# Patient Record
Sex: Female | Born: 1960
Health system: Southern US, Community
[De-identification: ages and names within clinical notes are randomized; demographics above are authoritative.]

## PROBLEM LIST (undated history)

## (undated) DIAGNOSIS — R609 Edema, unspecified: Secondary | ICD-10-CM

## (undated) DIAGNOSIS — G4730 Sleep apnea, unspecified: Secondary | ICD-10-CM

## (undated) DIAGNOSIS — R51 Headache: Secondary | ICD-10-CM

## (undated) DIAGNOSIS — M549 Dorsalgia, unspecified: Secondary | ICD-10-CM

## (undated) DIAGNOSIS — M2550 Pain in unspecified joint: Secondary | ICD-10-CM

## (undated) DIAGNOSIS — M62838 Other muscle spasm: Secondary | ICD-10-CM

## (undated) DIAGNOSIS — G8929 Other chronic pain: Secondary | ICD-10-CM

## (undated) DIAGNOSIS — R3915 Urgency of urination: Secondary | ICD-10-CM

## (undated) DIAGNOSIS — M797 Fibromyalgia: Secondary | ICD-10-CM

## (undated) DIAGNOSIS — Z87898 Personal history of other specified conditions: Secondary | ICD-10-CM

## (undated) DIAGNOSIS — T7840XA Allergy, unspecified, initial encounter: Secondary | ICD-10-CM

## (undated) DIAGNOSIS — F32A Depression, unspecified: Secondary | ICD-10-CM

## (undated) DIAGNOSIS — T8859XA Other complications of anesthesia, initial encounter: Secondary | ICD-10-CM

## (undated) DIAGNOSIS — Z8709 Personal history of other diseases of the respiratory system: Secondary | ICD-10-CM

## (undated) DIAGNOSIS — G4700 Insomnia, unspecified: Secondary | ICD-10-CM

## (undated) DIAGNOSIS — Z860100 Personal history of colon polyps, unspecified: Secondary | ICD-10-CM

## (undated) DIAGNOSIS — M1990 Unspecified osteoarthritis, unspecified site: Secondary | ICD-10-CM

## (undated) DIAGNOSIS — D649 Anemia, unspecified: Secondary | ICD-10-CM

## (undated) DIAGNOSIS — R350 Frequency of micturition: Secondary | ICD-10-CM

## (undated) DIAGNOSIS — K589 Irritable bowel syndrome without diarrhea: Secondary | ICD-10-CM

## (undated) DIAGNOSIS — R52 Pain, unspecified: Secondary | ICD-10-CM

## (undated) DIAGNOSIS — R32 Unspecified urinary incontinence: Secondary | ICD-10-CM

## (undated) DIAGNOSIS — F419 Anxiety disorder, unspecified: Secondary | ICD-10-CM

## (undated) DIAGNOSIS — I10 Essential (primary) hypertension: Secondary | ICD-10-CM

## (undated) DIAGNOSIS — R600 Localized edema: Secondary | ICD-10-CM

## (undated) DIAGNOSIS — K219 Gastro-esophageal reflux disease without esophagitis: Secondary | ICD-10-CM

## (undated) DIAGNOSIS — T4145XA Adverse effect of unspecified anesthetic, initial encounter: Secondary | ICD-10-CM

## (undated) DIAGNOSIS — G709 Myoneural disorder, unspecified: Secondary | ICD-10-CM

## (undated) DIAGNOSIS — R351 Nocturia: Secondary | ICD-10-CM

## (undated) DIAGNOSIS — G629 Polyneuropathy, unspecified: Secondary | ICD-10-CM

## (undated) DIAGNOSIS — M810 Age-related osteoporosis without current pathological fracture: Secondary | ICD-10-CM

## (undated) DIAGNOSIS — F329 Major depressive disorder, single episode, unspecified: Secondary | ICD-10-CM

## (undated) DIAGNOSIS — I499 Cardiac arrhythmia, unspecified: Secondary | ICD-10-CM

## (undated) HISTORY — PX: ANTERIOR FUSION CERVICAL SPINE: SUR626

## (undated) HISTORY — PX: CHOLECYSTECTOMY: SHX55

## (undated) HISTORY — PX: COLONOSCOPY: SHX174

## (undated) HISTORY — PX: KNEE ARTHROSCOPY: SUR90

## (undated) HISTORY — PX: CARPAL TUNNEL RELEASE: SHX101

## (undated) HISTORY — PX: OTHER SURGICAL HISTORY: SHX169

## (undated) HISTORY — PX: LUMBAR LAMINECTOMY: SHX95

## (undated) HISTORY — PX: ABDOMINAL HYSTERECTOMY: SHX81

## (undated) HISTORY — PX: SEPTOPLASTY: SUR1290

## (undated) HISTORY — PX: ESOPHAGEAL DILATION: SHX303

---

## 1997-06-28 ENCOUNTER — Emergency Department (HOSPITAL_COMMUNITY): Admission: EM | Admit: 1997-06-28 | Discharge: 1997-06-28 | Payer: Self-pay | Admitting: Emergency Medicine

## 1998-03-11 ENCOUNTER — Encounter: Payer: Self-pay | Admitting: Surgery

## 1998-03-17 ENCOUNTER — Encounter: Payer: Self-pay | Admitting: Surgery

## 1998-03-17 ENCOUNTER — Observation Stay (HOSPITAL_COMMUNITY): Admission: RE | Admit: 1998-03-17 | Discharge: 1998-03-18 | Payer: Self-pay | Admitting: Surgery

## 1999-01-08 ENCOUNTER — Other Ambulatory Visit: Admission: RE | Admit: 1999-01-08 | Discharge: 1999-01-08 | Payer: Self-pay | Admitting: Family Medicine

## 2000-05-03 ENCOUNTER — Encounter: Payer: Self-pay | Admitting: Neurosurgery

## 2000-05-05 ENCOUNTER — Encounter: Payer: Self-pay | Admitting: Neurosurgery

## 2000-05-05 ENCOUNTER — Inpatient Hospital Stay (HOSPITAL_COMMUNITY): Admission: RE | Admit: 2000-05-05 | Discharge: 2000-05-06 | Payer: Self-pay | Admitting: Neurosurgery

## 2000-05-15 ENCOUNTER — Emergency Department (HOSPITAL_COMMUNITY): Admission: EM | Admit: 2000-05-15 | Discharge: 2000-05-15 | Payer: Self-pay | Admitting: Emergency Medicine

## 2000-05-24 ENCOUNTER — Encounter: Admission: RE | Admit: 2000-05-24 | Discharge: 2000-05-24 | Payer: Self-pay | Admitting: Neurosurgery

## 2000-05-24 ENCOUNTER — Encounter: Payer: Self-pay | Admitting: Neurosurgery

## 2000-07-05 ENCOUNTER — Other Ambulatory Visit: Admission: RE | Admit: 2000-07-05 | Discharge: 2000-07-05 | Payer: Self-pay | Admitting: Family Medicine

## 2000-07-10 ENCOUNTER — Encounter: Payer: Self-pay | Admitting: Neurosurgery

## 2000-07-10 ENCOUNTER — Encounter: Admission: RE | Admit: 2000-07-10 | Discharge: 2000-07-10 | Payer: Self-pay | Admitting: Neurosurgery

## 2001-01-05 ENCOUNTER — Encounter: Admission: RE | Admit: 2001-01-05 | Discharge: 2001-01-05 | Payer: Self-pay | Admitting: Otolaryngology

## 2001-01-05 ENCOUNTER — Encounter: Payer: Self-pay | Admitting: Otolaryngology

## 2001-01-16 ENCOUNTER — Encounter (INDEPENDENT_AMBULATORY_CARE_PROVIDER_SITE_OTHER): Payer: Self-pay | Admitting: Specialist

## 2001-01-16 ENCOUNTER — Other Ambulatory Visit: Admission: RE | Admit: 2001-01-16 | Discharge: 2001-01-16 | Payer: Self-pay | Admitting: Internal Medicine

## 2001-11-22 ENCOUNTER — Other Ambulatory Visit: Admission: RE | Admit: 2001-11-22 | Discharge: 2001-11-22 | Payer: Self-pay | Admitting: Family Medicine

## 2002-11-21 ENCOUNTER — Encounter: Payer: Self-pay | Admitting: Family Medicine

## 2002-11-21 ENCOUNTER — Ambulatory Visit (HOSPITAL_COMMUNITY): Admission: RE | Admit: 2002-11-21 | Discharge: 2002-11-21 | Payer: Self-pay | Admitting: Family Medicine

## 2002-12-10 ENCOUNTER — Other Ambulatory Visit: Admission: RE | Admit: 2002-12-10 | Discharge: 2002-12-10 | Payer: Self-pay | Admitting: Gynecology

## 2003-07-20 ENCOUNTER — Emergency Department (HOSPITAL_COMMUNITY): Admission: EM | Admit: 2003-07-20 | Discharge: 2003-07-20 | Payer: Self-pay

## 2003-08-22 ENCOUNTER — Ambulatory Visit (HOSPITAL_COMMUNITY): Admission: RE | Admit: 2003-08-22 | Discharge: 2003-08-23 | Payer: Self-pay | Admitting: Neurosurgery

## 2004-09-23 ENCOUNTER — Encounter: Admission: RE | Admit: 2004-09-23 | Discharge: 2004-11-04 | Payer: Self-pay | Admitting: Orthopedic Surgery

## 2005-02-11 ENCOUNTER — Other Ambulatory Visit: Admission: RE | Admit: 2005-02-11 | Discharge: 2005-02-11 | Payer: Self-pay | Admitting: Family Medicine

## 2005-04-22 ENCOUNTER — Ambulatory Visit (HOSPITAL_COMMUNITY): Admission: RE | Admit: 2005-04-22 | Discharge: 2005-04-22 | Payer: Self-pay | Admitting: Neurosurgery

## 2005-04-25 ENCOUNTER — Ambulatory Visit (HOSPITAL_COMMUNITY): Admission: RE | Admit: 2005-04-25 | Discharge: 2005-04-25 | Payer: Self-pay | Admitting: Family Medicine

## 2005-07-01 ENCOUNTER — Observation Stay (HOSPITAL_COMMUNITY): Admission: RE | Admit: 2005-07-01 | Discharge: 2005-07-02 | Payer: Self-pay | Admitting: Obstetrics and Gynecology

## 2005-07-01 ENCOUNTER — Encounter (INDEPENDENT_AMBULATORY_CARE_PROVIDER_SITE_OTHER): Payer: Self-pay | Admitting: Specialist

## 2005-07-03 ENCOUNTER — Inpatient Hospital Stay (HOSPITAL_COMMUNITY): Admission: AD | Admit: 2005-07-03 | Discharge: 2005-07-03 | Payer: Self-pay | Admitting: Obstetrics and Gynecology

## 2005-08-24 ENCOUNTER — Ambulatory Visit (HOSPITAL_COMMUNITY): Admission: RE | Admit: 2005-08-24 | Discharge: 2005-08-24 | Payer: Self-pay | Admitting: Neurosurgery

## 2005-10-26 ENCOUNTER — Inpatient Hospital Stay (HOSPITAL_COMMUNITY): Admission: RE | Admit: 2005-10-26 | Discharge: 2005-10-27 | Payer: Self-pay | Admitting: Neurosurgery

## 2005-11-15 ENCOUNTER — Ambulatory Visit (HOSPITAL_COMMUNITY): Admission: RE | Admit: 2005-11-15 | Discharge: 2005-11-15 | Payer: Self-pay | Admitting: Neurosurgery

## 2006-02-02 ENCOUNTER — Encounter: Admission: RE | Admit: 2006-02-02 | Discharge: 2006-03-30 | Payer: Self-pay | Admitting: Neurosurgery

## 2006-02-27 LAB — HM COLONOSCOPY

## 2006-06-19 ENCOUNTER — Encounter: Admission: RE | Admit: 2006-06-19 | Discharge: 2006-06-19 | Payer: Self-pay | Admitting: Neurosurgery

## 2006-07-15 ENCOUNTER — Encounter
Admission: RE | Admit: 2006-07-15 | Discharge: 2006-07-15 | Payer: Self-pay | Admitting: Physical Medicine and Rehabilitation

## 2006-08-08 ENCOUNTER — Emergency Department (HOSPITAL_COMMUNITY): Admission: EM | Admit: 2006-08-08 | Discharge: 2006-08-08 | Payer: Self-pay | Admitting: Emergency Medicine

## 2006-09-01 ENCOUNTER — Encounter: Admission: RE | Admit: 2006-09-01 | Discharge: 2006-09-01 | Payer: Self-pay | Admitting: Neurosurgery

## 2006-09-06 ENCOUNTER — Encounter: Admission: RE | Admit: 2006-09-06 | Discharge: 2006-09-06 | Payer: Self-pay | Admitting: Neurosurgery

## 2006-10-12 ENCOUNTER — Encounter: Admission: RE | Admit: 2006-10-12 | Discharge: 2006-10-12 | Payer: Self-pay | Admitting: Neurology

## 2006-12-04 ENCOUNTER — Encounter
Admission: RE | Admit: 2006-12-04 | Discharge: 2007-03-04 | Payer: Self-pay | Admitting: Physical Medicine & Rehabilitation

## 2006-12-05 ENCOUNTER — Ambulatory Visit: Payer: Self-pay | Admitting: Physical Medicine & Rehabilitation

## 2006-12-21 ENCOUNTER — Emergency Department (HOSPITAL_COMMUNITY): Admission: EM | Admit: 2006-12-21 | Discharge: 2006-12-21 | Payer: Self-pay | Admitting: Emergency Medicine

## 2007-01-15 ENCOUNTER — Ambulatory Visit: Payer: Self-pay | Admitting: Physical Medicine & Rehabilitation

## 2007-02-21 ENCOUNTER — Encounter
Admission: RE | Admit: 2007-02-21 | Discharge: 2007-03-06 | Payer: Self-pay | Admitting: Physical Medicine & Rehabilitation

## 2007-03-01 ENCOUNTER — Encounter: Admission: RE | Admit: 2007-03-01 | Discharge: 2007-03-01 | Payer: Self-pay | Admitting: Neurosurgery

## 2007-03-06 ENCOUNTER — Ambulatory Visit: Payer: Self-pay | Admitting: Physical Medicine & Rehabilitation

## 2007-03-10 ENCOUNTER — Emergency Department (HOSPITAL_COMMUNITY): Admission: EM | Admit: 2007-03-10 | Discharge: 2007-03-11 | Payer: Self-pay | Admitting: Emergency Medicine

## 2007-03-10 ENCOUNTER — Emergency Department (HOSPITAL_COMMUNITY): Admission: EM | Admit: 2007-03-10 | Discharge: 2007-03-10 | Payer: Self-pay | Admitting: Emergency Medicine

## 2007-04-16 ENCOUNTER — Encounter: Admission: RE | Admit: 2007-04-16 | Discharge: 2007-04-16 | Payer: Self-pay | Admitting: Family Medicine

## 2007-04-30 ENCOUNTER — Encounter: Admission: RE | Admit: 2007-04-30 | Discharge: 2007-04-30 | Payer: Self-pay | Admitting: Gastroenterology

## 2007-05-14 ENCOUNTER — Encounter: Admission: RE | Admit: 2007-05-14 | Discharge: 2007-05-14 | Payer: Self-pay | Admitting: Gastroenterology

## 2008-04-17 ENCOUNTER — Encounter: Admission: RE | Admit: 2008-04-17 | Discharge: 2008-04-17 | Payer: Self-pay | Admitting: Family Medicine

## 2008-04-24 ENCOUNTER — Encounter: Admission: RE | Admit: 2008-04-24 | Discharge: 2008-04-24 | Payer: Self-pay | Admitting: Family Medicine

## 2009-01-27 ENCOUNTER — Emergency Department (HOSPITAL_COMMUNITY): Admission: EM | Admit: 2009-01-27 | Discharge: 2009-01-27 | Payer: Self-pay | Admitting: Emergency Medicine

## 2009-03-19 ENCOUNTER — Encounter: Admission: RE | Admit: 2009-03-19 | Discharge: 2009-03-19 | Payer: Self-pay | Admitting: Neurosurgery

## 2010-03-21 HISTORY — PX: BACK SURGERY: SHX140

## 2010-03-30 LAB — HM PAP SMEAR: HM Pap smear: NEGATIVE

## 2010-04-21 ENCOUNTER — Ambulatory Visit: Payer: Medicare Other | Attending: Neurosurgery | Admitting: Physical Therapy

## 2010-04-21 DIAGNOSIS — IMO0001 Reserved for inherently not codable concepts without codable children: Secondary | ICD-10-CM | POA: Insufficient documentation

## 2010-04-21 DIAGNOSIS — M542 Cervicalgia: Secondary | ICD-10-CM | POA: Insufficient documentation

## 2010-04-21 DIAGNOSIS — R5381 Other malaise: Secondary | ICD-10-CM | POA: Insufficient documentation

## 2010-04-21 DIAGNOSIS — M2569 Stiffness of other specified joint, not elsewhere classified: Secondary | ICD-10-CM | POA: Insufficient documentation

## 2010-04-26 ENCOUNTER — Ambulatory Visit: Payer: Medicare Other | Admitting: Physical Therapy

## 2010-04-29 ENCOUNTER — Ambulatory Visit: Payer: Medicare Other | Admitting: Physical Therapy

## 2010-05-03 ENCOUNTER — Ambulatory Visit: Payer: Medicare Other | Admitting: Physical Therapy

## 2010-05-06 ENCOUNTER — Ambulatory Visit: Payer: Medicare Other | Admitting: Physical Therapy

## 2010-05-11 ENCOUNTER — Ambulatory Visit: Payer: Medicare Other | Admitting: *Deleted

## 2010-05-14 ENCOUNTER — Ambulatory Visit: Payer: Medicare Other | Admitting: *Deleted

## 2010-05-31 ENCOUNTER — Ambulatory Visit: Payer: Medicare Other | Admitting: Physical Therapy

## 2010-06-04 ENCOUNTER — Ambulatory Visit: Payer: Medicare Other | Attending: Neurosurgery | Admitting: Physical Therapy

## 2010-06-04 DIAGNOSIS — M542 Cervicalgia: Secondary | ICD-10-CM | POA: Insufficient documentation

## 2010-06-04 DIAGNOSIS — IMO0001 Reserved for inherently not codable concepts without codable children: Secondary | ICD-10-CM | POA: Insufficient documentation

## 2010-06-04 DIAGNOSIS — M2569 Stiffness of other specified joint, not elsewhere classified: Secondary | ICD-10-CM | POA: Insufficient documentation

## 2010-06-04 DIAGNOSIS — R5381 Other malaise: Secondary | ICD-10-CM | POA: Insufficient documentation

## 2010-06-08 ENCOUNTER — Ambulatory Visit: Payer: Medicare Other | Admitting: *Deleted

## 2010-06-10 ENCOUNTER — Ambulatory Visit: Payer: Medicare Other | Admitting: *Deleted

## 2010-06-15 ENCOUNTER — Ambulatory Visit: Payer: Medicare Other | Admitting: *Deleted

## 2010-06-22 ENCOUNTER — Ambulatory Visit: Payer: Medicare Other | Attending: Neurosurgery | Admitting: *Deleted

## 2010-06-22 DIAGNOSIS — M2569 Stiffness of other specified joint, not elsewhere classified: Secondary | ICD-10-CM | POA: Insufficient documentation

## 2010-06-22 DIAGNOSIS — M542 Cervicalgia: Secondary | ICD-10-CM | POA: Insufficient documentation

## 2010-06-22 DIAGNOSIS — IMO0001 Reserved for inherently not codable concepts without codable children: Secondary | ICD-10-CM | POA: Insufficient documentation

## 2010-06-22 DIAGNOSIS — R5381 Other malaise: Secondary | ICD-10-CM | POA: Insufficient documentation

## 2010-06-23 LAB — URINALYSIS, ROUTINE W REFLEX MICROSCOPIC
Bilirubin Urine: NEGATIVE
Ketones, ur: NEGATIVE mg/dL
Nitrite: POSITIVE — AB
Protein, ur: 30 mg/dL — AB
Urobilinogen, UA: 1 mg/dL (ref 0.0–1.0)

## 2010-06-23 LAB — URINE CULTURE: Colony Count: >=100000

## 2010-06-24 ENCOUNTER — Ambulatory Visit: Payer: Medicare Other | Admitting: *Deleted

## 2010-06-29 ENCOUNTER — Ambulatory Visit: Payer: Medicare Other | Admitting: *Deleted

## 2010-07-01 ENCOUNTER — Ambulatory Visit: Payer: Medicare Other | Admitting: *Deleted

## 2010-07-06 ENCOUNTER — Ambulatory Visit: Payer: Medicare Other | Admitting: *Deleted

## 2010-07-08 ENCOUNTER — Encounter: Payer: Medicare Other | Admitting: Physical Therapy

## 2010-07-13 ENCOUNTER — Ambulatory Visit: Payer: Medicare Other | Admitting: *Deleted

## 2010-07-15 ENCOUNTER — Encounter: Payer: Medicare Other | Admitting: *Deleted

## 2010-07-16 ENCOUNTER — Ambulatory Visit: Payer: Medicare Other | Attending: Family Medicine

## 2010-07-16 DIAGNOSIS — G4733 Obstructive sleep apnea (adult) (pediatric): Secondary | ICD-10-CM | POA: Insufficient documentation

## 2010-07-26 NOTE — Procedures (Signed)
NAME:  Taylor Ritter, Taylor Ritter NO.:  [id number]  MEDICAL RECORD NO.:  [id number]          PATIENT TYPE:  OUT  LOCATION:  SLEEP LAB                     FACILITY:  [facility]  PHYSICIAN:  Ernestina Penna, M.D.  DATE OF BIRTH:  [birth year]  DATE OF STUDY:  07/16/2010                           NOCTURNAL POLYSOMNOGRAM  REFERRING PHYSICIAN:  INDICATION FOR STUDY:  This is a 50 year old who presents with hypersomnia, obesity, and snoring.  The study is being done to evaluate for obstructive sleep apnea syndrome.  EPWORTH SLEEPINESS SCORE: 1. BMI 40.  MEDICATIONS:  Hydrochlorothiazide, Flonase, Wellbutrin, Nexium, Zantac, Flexeril.  SLEEP ARCHITECTURE:  The total recording time is 446 minutes.  Sleep efficiency 82%, sleep latency 51 minutes, REM latency 270 minutes. Stage N1 4.1%, N2 51%, N3 37%, and REM sleep 8%.  RESPIRATORY DATA:  Baseline oxygen saturation is 95, lowest saturation 87.  AHI 5.7.  OXYGEN DATA:  CARDIAC DATA:  Average heart rate 87 with no significant dysrhythmias observed.  MOVEMENT-PARASOMNIA:  PLM index 1 although the patient is noted to have increased phasic EMG activity in REM sleep.  IMPRESSIONS-RECOMMENDATIONS: 1. Mild obstructive sleep apnea syndrome, not requiring positive     pressure. 2. Increased phasic EMG activity which can be seen with REM sleep,     behavior disorder.     Danira Nylander A. Gerilyn Pilgrim, M.D. Electronically Signed 07/28/2010 17:57:18     Ernestina Penna, M.D.    KAD/MEDQ  D:  07/26/2010 09:58:15  T:  07/26/2010 22:30:16  Job:  [id number]

## 2010-08-03 NOTE — Assessment & Plan Note (Signed)
DATE OF FOLLOWUP VISIT:  January 02, 2007.   This is a 50 year old female with neck pain, ACDF C5, C6 May 05, 2000.  She also has a lumbar laminectomy with post laminectomy syndrome.  She initially saw me in consultation on  December 04, 2006.   The patient returns today stating that overall she is doing better,  average pain is 4-5 out of 10 level.  She was switched to oxycodone to  Darvocet and she is taking 3 or less a day.   She is going through fibromyalgia education as well.  She is looking at  acupuncture.  She states that she cannot get a treatment until November.   EXAMINATION:  GENERAL:  Mood and affect appropriate and bright.  BACK:  Has tenderness in the lumbar area, mainly at L5/S1 area.  SHOULDERS:  Have tenderness to palpation bilateral upper trapezius,  mainly upper and lower extremity strength is good.  GAIT:  Shows no toe drag or knee instability.  She ambulates both with  and without a cane.   IMPRESSION:  1. Cervical post laminectomy syndrome.  2. Lumbar post laminectomy syndrome.  3. Fibromyalgia syndrome.   PLAN:  Will do a trial of acupuncture treatment.  She gets this done  today but she will followup at [facility] to get the rest of her  treatments.   Needles placed bilaterally at EL 23, DL 52, and CB 21 E stem 4 Hertz x20  minutes.  The patient tolerated the procedure well.      Erick Colace, M.D.  Electronically Signed     AEK/MedQ  D:  01/02/2007 11:10:57  T:  01/02/2007 20:58:17  Job #:  [id number]   cc:   Hilda Lias, M.D.  Fax: [phone]

## 2010-08-03 NOTE — Assessment & Plan Note (Signed)
A 50 year old female with neck pain, status post ACDF, C5-6 May 05, 2000, as well as low back pain and lumbar post laminectomy syndrome.  She had seen me last on January 16, 2007.  She no-showed for an  appointment on 02/12/2007, and has followed up in the interval time with  Dr. Jeral Fruit.  She did receive narcotic analgesics from Dr. Jeral Fruit and I  explained to her that this was a violation of her controlled substance  agreement.  She has experienced exacerbation of her mid back pain, as  well as low back pain.  She has no exacerbation of her neck pain.  No  increased pain in the arms or legs, but she feels like she is weaker and  she is more constipated, but has no symptoms of bowel or bladder  incontinence.  She continues to see Dr. Evelene Croon from psychiatry.  Her pain  is averaging 5 to 6 out of 10 despite narcotic analgesics.  Last visit,  her pain level was also in the 5 to 6 out of 10 range.   REVIEW OF SYSTEMS:  Positive for numbness, tremor, tingling, trouble  walking, confusion, depression, anxiety.  Negative for suicidal  thoughts.  Positive for alternating diarrhea/constipation.   PAST MEDICAL HISTORY:  Also significant for fibromyalgia syndrome.  Significant for hypertension.   PHYSICAL EXAMINATION:  VITAL SIGNS:  Her blood pressure is 131/87, pulse  103, respirations 18, O2 sat 99% on room air.  GENERAL:  Obese female, appears depressed and anxious.  Oriented x3.  MUSCULOSKELETAL:  Gait is flexed forward at the hips.  No evidence of  __________  knee instability.  Her motor strength is 5/5 bilateral  deltoid, biceps, triceps, grip, as well as hip flexion, knee extension,  ankle dorsiflexion.  Sensation is intact bilateral C5, 6, 7, 8, T1, as  well as L2, 3, 4, 5, S1 dermatomes.  Range of motion is full bilateral  upper and lower extremities.  Back has some tenderness at the  lumbosacral junction.  Deep tendon reflexes are 3+ bilateral knees,  otherwise 2+ at the ankles, 2+  at the biceps, triceps, brachioradialis.  NEURO:  Cranial nerves II-XII are intact.  She has no evidence of ataxia.   Review of her CT myelogram report, as well as looking it up on E-chart,  she has L4-5 protrusion narrowing lateral recesses, left greater than  right, facet arthropathy, small protrusion at L5-S1.  In the thoracic  spine there is a shallow protrusion at T9-10 without neural compression  and T10-11 has some disc degeneration and osteophytes, disc osteophyte  complex without neural compression.   IMPRESSION:  Exacerbation of low back and mid back pain.  She had a  previous CT myelogram but this has not been compared by radiology and  she is planning to see Dr. Jeral Fruit back to go over this as well.   I do not see any signs of cauda equina syndrome or any spinal cord  compression on clinical examination.   I instructed the patient to see Dr. Jeral Fruit.  She can continue the  hydrocodone that she received from Dr. Jeral Fruit.  I have added back some  gabapentin 300 b.i.d. and increasing to t.i.d.  This may help with some  of her fibromyalgia type symptoms.  I would be happy to see her back,  once reevaluated by Dr. Jeral Fruit, but I will not schedule an appointment  until that time.   This was discussed with the patient.  I also called  over to Dr. Cassandria Santee  office to discuss with his nurse.      Erick Colace, M.D.  Electronically Signed     AEK/MedQ  D:  03/06/2007 12:07:25  T:  03/06/2007 13:15:52  Job #:  [id number]   cc:   Pam Drown, M.D.  Fax: [phone]   Milagros Evener, M.D.  Fax: [phone]

## 2010-08-03 NOTE — Assessment & Plan Note (Signed)
A 50 year old female with neck pain, ACDF C5-C6 May 05, 2000,  lumbar laminectomy with post laminectomy syndrome seen by me in initial  consultation December 04, 2006, seen by me last on January 02, 2007.   Overall doing better, feeling good benefit from integrative therapy  counseling as well as massage as well as physical therapy.  Average pain  5-6/10.  She complains of being easily distracted, losing sense of time  and feeling tired all of the time.  She can walk 10 minutes at a time.  She can climb steps.  She has some days when she needs assistance with  shopping, meal prep,  household duties, bathing and dressing.   REVIEW OF SYSTEMS:  Positive for weakness, numbness, tremor, tingling,  trouble walking, dizziness, confusion, depression, anxiety, diarrhea.   Her blood pressure is 130/76, pulse 95, respiratory rate 18, O-sat 98%  on room air.  GENERAL:  In no acute distress.  Orientation x3.  Gait is normal.  Exam was done in the presence of Dr.  Anderson Malta from [facility].  She has tenderness  to palpation bilateral upper traps, bilateral low back, bilateral hips  in the fibromyalgia tender points.  She also has some tenderness  bilateral knees.  She had good strength upper and lower extremities.  Mood and affect appropriate .  Appears tired.  Gait shows no evident toe  drag or knee instability.   IMPRESSION:  1. Cervical post laminectomy syndrome.  2. Lumbar post laminectomy syndrome.  3. Fibromyalgia:  I think this is her overlying diagnosis.   PLAN:  1. Reviewed her medications.  She is on multiple medications that can      cause drowsiness.  Of note is that she is on diazepam 1-2 tablets 3      times daily.  She is no longer on oxycodone, we had put her      Darvocet-N 100 t.i.d.  2. Recommend reducing diazepam from 1-2 tablets in the morning to 1      tablet in the morning x1 week and then stop the morning dose and      just continue the  evening dose.   She will follow up with Dr. Evelene Croon in regards to her depression/anxiety.   She is no longer on hydrocodone.   I will see her back in about 1 month.  She will continue her outpatient  therapy.  She will be starting some acupuncture at integrative therapy  which I think is a good idea for her as well.      Erick Colace, M.D.  Electronically Signed     AEK/MedQ  D:  01/16/2007 16:43:24  T:  01/17/2007 09:49:37  Job #:  [id number]   cc:   Hilda Lias, M.D.  Fax: [phone]   Milagros Evener, M.D.  Fax: [phone]
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
## 2010-08-06 NOTE — Op Note (Signed)
NAME:  TEKEYA, GEFFERT                         ACCOUNT NO.:  [id number]   MEDICAL RECORD NO.:  [id number]                   PATIENT TYPE:  OIB   LOCATION:  3008                                 FACILITY:  [facility]   PHYSICIAN:  Hilda Lias, M.D.                DATE OF BIRTH:  October 28, 1960   DATE OF PROCEDURE:  08/22/2003  DATE OF DISCHARGE:                                 OPERATIVE REPORT   PREOPERATIVE DIAGNOSIS:  Left L5-S1 herniated disk.   POSTOPERATIVE DIAGNOSIS:  Left l5S1 herniated disk.   PROCEDURE:  Left L5-S1 diskectomy, foraminotomy, decompression of the L5 and  S1 nerve root.  Microscope.   SURGEON:  Hilda Lias, M.D.   ASSISTANT:  Stefani Dama, M.D.   CLINICAL HISTORY:  The patient was admitted because of back and left pain.  The patient had failed with conservative treatment including epidural  injection.  X-rays showed that she has a herniated disk at L5-S1.  The  patient is 5 feet 6 inches and 260 pounds weight.  The risks were explained  in the history and physical.   PROCEDURE:  The patient taken to the OR, and she was positioned in a prone  manner.  It was difficult to position her because of her size.  It also was  difficult to flex.  Once we positioned her, it was difficult to palpate the  spinous process and after we cleaned the wound with Betadine, a needle was  inserted into the area.  X-rays showed that we were at the level of L5-S1.  Incision through the skin through a thick layer of adipose tissue was done.  A __________ was left in the left side.  An x-ray showed that indeed we were  at the level of L5-S1.  The only way to see the area was to bring the  microscope immediately.  With the drill we drilled the lower lamina of L5  and the upper of S1.  The patient despite her side was __________ stenotic.  The yellow ligament also was excised.  We found the S1 nerve root, which was  swollen.  We dissected and lysis was accomplished.  We were able to  retract  the S1 nerve root medially.  There was a herniated disk with a fragment  going to the body of S1.  We entered the disk space and using the pituitary  rongeurs as well as the bone curette, medial and lateral total diskectomy  was accomplished.  Having done this, we investigate the foramen of S1 and  also the L5 was completely open.  There was no evidence of any fragment.  The patient not only had a soft herniated disk, but also there was an  osteophyte mostly in the midline.  Having good decompression, Valsalva  maneuver was negative.  Then Depo-Medrol and fentanyl were left in the  epidural space and the wound was closed with Vicryl  and a Steri-Strip.                                               Hilda Lias, M.D.    EB/MEDQ  D:  08/22/2003  T:  08/23/2003  Job:  [id number]

## 2010-08-06 NOTE — H&P (Signed)
NAME:  Taylor Ritter, DIEP               ACCOUNT NO.:  [id number]   MEDICAL RECORD NO.:  [id number]          PATIENT TYPE:  AMB   LOCATION:  SDS                          FACILITY:  [facility]   PHYSICIAN:  Hilda Lias, M.D.   DATE OF BIRTH:  03-06-1961   DATE OF ADMISSION:  10/25/2005  DATE OF DISCHARGE:                                HISTORY & PHYSICAL   Ms. Lowman is a lady who in the past underwent a fusion at the level 5-6.  The patient did well.  Since then she has had carpal tunnel surgery.  Now  she came to see me complaining of neck pain with radiation to both upper  extremities.  X-rays showed that she has a decreased flexibility of the  cervical spine with weakening of the deltoids and triceps.  Because of the  findings, a myelogram was done and she is being admitted for surgery.   PAST MEDICAL HISTORY:  1.  Anterior cervical fusion 5-6 in 2002.  2.  Carpal tunnel surgery.  3.  Cholecystectomy.  4.  Esophageal stretch maneuver.  5.  Left L5 spine discectomy.   She is allergic to CODEINE.   SOCIAL HISTORY:  Negative.   FAMILY HISTORY:  Mother has asthma, father diabetes.   REVIEW OF SYSTEMS:  Positive for neck pain.   PHYSICAL EXAMINATION:  HEENT:  Normal.  NECK:  There is scar anteriorly.  She is able to flex but extension and  lateralization causes pain going to the shoulder.  CARDIOVASCULAR:  Clear.  LUNGS:  Clear.  ABDOMEN:  Clear.  EXTREMITIES:  Normal.  NEUROLOGIC:  She has a weakening of the deltoid and the triceps.  Reflexes  are symmetrical.  Sensation:  She complains of numbness in both deltoids.  Coordination and gait normal.   Cervical spine x-rays show the area where she had surgery at 5-6 normal.  She has stenosis at the level of 4-5 and 6-7 with a central herniated  disease.   CLINICAL IMPRESSION:  1.  C4-5, C5-6 herniated disc.  2.  Radiculopathy.  3.  Status post cervical fusion 5-6.   RECOMMENDATIONS:  The patient is being admitted for surgery.   The procedure  will be a 4-5 and 6-7 fusion, removing the old plate.  She knows about the  risks such as infection, CSF leak, no improvement whatsoever, need of  further surgery which might require a posterior approach.  Also, the risks  associated with scar tissue in her neck, the possibility of having a Horner  syndrome.           ______________________________  Hilda Lias, M.D.     EB/MEDQ  D:  10/25/2005  T:  10/25/2005  Job:  [id number]

## 2010-08-06 NOTE — Op Note (Signed)
NAME:  Taylor Ritter, Taylor Ritter               ACCOUNT NO.:  [id number]   MEDICAL RECORD NO.:  [id number]          PATIENT TYPE:  OIB   LOCATION:  3001                         FACILITY:  [facility]   PHYSICIAN:  Hilda Lias, M.D.   DATE OF BIRTH:  18-Feb-1961   DATE OF PROCEDURE:  DATE OF DISCHARGE:                                 OPERATIVE REPORT   PREOPERATIVE DIAGNOSES:  1.  C4-C5 herniated disk.  2.  C6-C7 hiatal hernia disk.  3.  Status post fusion C5-C6.   POSTOPERATIVE DIAGNOSES:  1.  C4-C5 herniated disk.  2.  C6-C7 hiatal hernia disk.  3.  Status post fusion C5-C6.   PROCEDURES:  Removal of C5-6 plate.  Total C4-5 and C6-7 diskectomy.  Decompression of the spinal cord.  Interbody fusion with allograft.  Plate  from C4 down to C7.  Microscope.   SURGEON:  Hilda Lias, M.D.   CLINICAL HISTORY:  The patient has been complaining of neck pain, worse to  the shoulder.  Patient previously, about 5 years ago, she had fusion at  level of C5-6.  X-rays showed that she has a herniated disk at level C4-5  and C6-7 centrally with bilateral compromise of the foramen.  Surgery was  advised, and the risks were explained to her in the history and physical.   PROCEDURE IN DETAIL:  The patient was taken to the OR.   The neck was prepped.  She has a small neck.  She is 5 feet 4 inches, 280  pounds.  Because of that, we put a roll between the shoulders.  Then an  incision was made transversely through the skin and subcutaneous tissue.  We  went straight down to the cervical area.  The plate was identified.  Removal  of the scar tissue surrounding the plate was achieved.  Then 4 screws as  well as the plate was removed.  From then on, we identified easily the C4-5  and C6-7.  We opened the anterior ligament of C4-5.  Patient had quite a bit  of osteophytes at this level plus at C6-7.  With the microscope, we did a  total diskectomy.  We opened the posterior ligament.  We decompressed the  spinal  cord, achieving bilateral foraminotomy.  At the end we had a good  decompression at that area.  At the level of C6-7 we saw a central herniated  disk with the spinal cord being pushed back.  Removal of the disk centrally  as well as decompression of the C7 nerve root was achieved.  Patient had  quite a bit of osteophytes in this area.  Then two pieces of allograft 7-mm  __________  were inserted followed by a plate.  We were able to use 7  screws.  Although the plate during the surgical procedure was flat against  the spine, nevertheless the x-ray showed a gap.  This might be secondary to  rotation.  Nevertheless, having the plate secured in place, we did  hemostasis.   We left a drain in the pericervical area and the wound was closed with  catgut.  The patient is ALLERGIC TO VICRYL AND NYLON.  A drain was left,  which was brought through a separate incision.  The skin was closed with  Steri-Strips.           ______________________________  Hilda Lias, M.D.     EB/MEDQ  D:  10/25/2005  T:  10/26/2005  Job:  [id number]

## 2010-08-06 NOTE — Op Note (Signed)
NAME:  Taylor Ritter, Taylor Ritter               ACCOUNT NO.:  [id number]   MEDICAL RECORD NO.:  [id number]          PATIENT TYPE:  AMB   LOCATION:  SDS                          FACILITY:  [facility]   PHYSICIAN:  Hilda Lias, M.D.   DATE OF BIRTH:  [birth year]   DATE OF PROCEDURE:  08/24/2005  DATE OF DISCHARGE:  08/24/2005                                 OPERATIVE REPORT   PREOPERATIVE DIAGNOSIS:  Left carpal tunnel syndrome.   POSTOPERATIVE DIAGNOSIS:  Left carpal tunnel syndrome.   OPERATION PERFORMED:  Decompression of the left median nerve.   SURGEON:  Hilda Lias, M.D.   ANESTHESIA:  Local plus IV sedation.   INDICATIONS FOR PROCEDURE:  The patient was admitted because of left arm  pain associated with weakness.  Nerve conduction studies were positive.  Previously, she had surgery on the right hand.  The risks were explained to  her including possibility of infection or recurrence.   DESCRIPTION OF PROCEDURE:  The patient was taken to the operating room and  then the left hand was prepped with prepped with DuraPrep.  Infiltration of  the base of thumb with 12 mL of Xylocaine was made.  Drapes were applied.  Incision followed the base of the thumb through the skin, volar ligament and  through a thick calcified ligament, the nerve was decompressed.  Proximal  and distal along the ulnar nerve.  At the end we had good decompression.  Hemostasis was done.  The area was irrigated and the wound was closed with  nylon.  At the end of procedure, the patient was able to move all the  fingers including the thumb.           ______________________________  Hilda Lias, M.D.     EB/MEDQ  D:  08/24/2005  T:  08/25/2005  Job:  [id number]

## 2010-08-06 NOTE — Op Note (Signed)
NAME:  Taylor Ritter, Taylor Ritter NO.:  [id number]   MEDICAL RECORD NO.:  [id number]          PATIENT TYPE:  OBV   LOCATION:  9316                          FACILITY:  [facility]   PHYSICIAN:  Osborn Coho, M.D.   DATE OF BIRTH:  22-May-1960   DATE OF PROCEDURE:  07/01/2005  DATE OF DISCHARGE:                                 OPERATIVE REPORT   PREOP DIAGNOSIS:  1.  Dysfunctional uterine bleeding.  2.  Menorrhagia  3.  Dysmenorrhea.  4.  Stress urinary incontinence with hypermobile urethra.   POSTOPERATIVE DIAGNOSIS:  1.  Dysfunctional uterine bleeding.  2.  Menorrhagia  3.  Dysmenorrhea.  4.  Stress urinary incontinence with hypermobile urethra.   PROCEDURE:  1.  Laparoscopically-assisted vaginal hysterectomy.  2.  TVT.  3.  Cystoscopy.  Attending   SURGEON:  Osborn Coho, M.D.   ASSISTANT:  Marquis Lunch. Adline Peals.   ANESTHESIA:  General.   SPECIMEN TO PATHOLOGY:  Uterus and cervix   ESTIMATED BLOOD LOSS:  100 mL   URINE OUTPUT:  600 mL.   FLUIDS:  3000 mL.   COMPLICATIONS:  None.   FINDINGS:  No endometriosis was noted, and normal-appearing bilateral  ovaries and fallopian tubes.   DESCRIPTION OF PROCEDURE:  The patient is taken to the operating room after  the risks, benefits, and alternatives were reviewed with the patient.  The  patient verbalized understanding and consent signed and witnessed.  The  patient was placed under general anesthesia and prepped and draped in the  normal sterile fashion in the dorsolithotomy position.  A bivalve speculum  was placed in the patient's vagina and a  Hulka placed for intrauterine  manipulation.  The speculum was removed.   Attention was turned to the abdomen where a 10-mm incision was made at the  umbilicus.  The Veress needle was introduced into the intra-abdominal cavity  and trocar introduced as well.  However, difficulty with visualization and  insufflation with questionable thought of adhesions.  Trocar was  removed and  a open laparoscopy performed.  The incision was carried down to the  underlying layer of fascia and the fascia was excised and the Roseanne Reno  introduced after tagging the fascia with #0 Vicryl.  The intra-abdominal  cavity was insufflated and no adhesions noted with thoughts of being in the  omentum previously.  The patient was placed in Trendelenburg; and uterus  identified; 5-mm incisions were then made in the left and right lower  quadrants; and 5-mm trocar advanced under direct visualization.  Blunt  probes were placed for manipulation; and tripolar was used to cauterize and  cut the round ligaments bilaterally.   Hydrodissection was difficult and, therefore, the bladder flap was not done  from above.  The bilateral utero-ovarians were cauterized and cut with the  tripolar.  Good hemostasis was noted.  The abdominal instruments were then  covered with a sterile drape and attention was then turned to the vagina  where the Hulka was removed.  The retractors were placed and cervix  circumscribed with the Bovie.  The bladder was dissected away  from the  cervix and anterior cul-de-sac entered without difficulty.  The posterior  cul-de-sac was entered as well, and long weighted speculum placed.  The  mastisons were used to clamp, cut, and suture ligate the uterosacral,  cardinal ligament, and parametria tissue bilaterally.  The uterus was  exteriorized by clamping the fundus with tenaculum and exteriorizing.  The  remaining pedicles were bilaterally clamped and ligated with #0 ties and  suture ligated using #0 Vicryl.  The remaining pedicles were hemostatic.  There was some bleeding noted at the cuff which was stitched and the vaginal  cuff was repaired with #0 Vicryl via figure-of-eight stitches.   Attention was then turned to the abdomen; again, and laparoscope introduced  and good hemostasis noted at the vaginal cuff.  There was a clot noted at on  the left side of the cuff  which was not removed and noted to be hemostatic.  All pedicles were hemostatic.  The instruments were removed from the intra-  abdominal cavity after the pneumoperitoneum released.  The stitches placed  on the fascia at the umbilicus were tied; and the subcutaneous tissue was  reapproximated using #0 Vicryl; 4-0 Monocryl was used for a subcuticular  stitch to reapproximate the skin.  Dermabond was used on the left and right  lower quadrant incisions.  The uterus and cervix were sent to pathology.   Attention was then turned to the vagina, once again, and approximately 8 mL  of dilute Pitressin of 20 units in 100 mL was injected in the anterior  vaginal wall overlying the midportion of the urethra.  The vaginal wall was  incised with the scalpel and using the Metzenbaum the vaginal mucosa was  undermined and the underlying tissue dissected away to the level of the  lower edge of the symphysis pubis.  Two incisions approximately 5 mm were  made, 2 fingerbreadths from the midline at the upper border of the symphysis  pubis.  The bladder was emptied completely; and rigid urethral catheter  guide placed via an 18-French Foley catheter.  The transabdominal guide was  passed on the left and the rigid urethral catheter guide retracted away to  the ipsilateral side.  The same was done on the contralateral side as well.  After passing the transabdominal guide the bladder was emptied, again, and  cystoscopy performed and no inadvertent bladder injury was noted.  The mesh  was attached to the transabdominal guides while retracting the rigid  urethral guide to the ipsilateral side.  The mesh was elevated through the  incisions at the symphysis pubis.  Indigo carmine was given and cystoscopy  performed, again, and no inadvertent bladder injury noted; and bilateral  ureters were noted to efflux. The 16-French Foley catheter was replaced; and the sheath over the mass removed while leaving slack, using a  large Kelly  between the urethra and mesh.  The mesh was left in place and excised at the  level of the skin at the incisions of the symphysis pubis.  The anterior  vaginal wall was repaired  using 2-0 Vicryl via a running locked stitch.  The vagina was packed with  plain packing soaked with estrogen cream.  Sponge, lap, and needle counts  were correct.  The patient tolerated procedure well and was returned to  recovery room in good condition.      Osborn Coho, M.D.  Electronically Signed     AR/MEDQ  D:  07/01/2005  T:  07/02/2005  Job:  [id number]

## 2010-08-06 NOTE — H&P (Signed)
[place]. [facility]  Patient:    Taylor Ritter, Taylor Ritter                        MRN: [id number] Adm. Date:  05/05/00 Attending:  Tanya Nones. Jeral Fruit, M.D.                         History and Physical  HISTORY OF PRESENT ILLNESS:  Taylor Ritter is a 50 year old lady who has been complaining of neck pain for several months, but on March 19, 2000, she developed worsening of the neck pain going to the right shoulder into the right upper extremity associated with numbness and tingling sensation.  The patient has had conservative treatment and she is not any better.  She cannot sleep and the only way she gets relief is when she puts her hands on top of the head.  She is quite miserable.  The patient was seen by me a few days ago in my office and she wanted to go ahead with surgery.  PAST MEDICAL HISTORY:  Cholecystectomy.  Carpal tunnel on the right hand. Cesarean section.  She also had some stretching of the esophagus.  ALLERGIES:  CHOLINE.  SOCIAL HISTORY:  The patient does not smoke and does not drink.  FAMILY HISTORY:  There is a history of high blood pressure, asthma, and heart disease.  REVIEW OF SYSTEMS:  Positive for nasal congestion, asthma, arm pain, and arm weakness.  PHYSICAL EXAMINATION:  GENERAL:  The patient came to my office and she was quite miserable.  She was keeping her right arm against the chest wall.  HEENT:  Normal.  NECK:  She is able to flex, but extension lateralization produces pain going through the right shoulder.  LUNGS:  There are some mild rhonchi.  HEART:  Sounds normal.  ABDOMEN:  Normal.  EXTREMITIES:  Normal pulses.  NEUROLOGICAL:  Mental status and cranial nerves normal.  Strength 5/5 except in the right bicep which is 2/5 and the wrist extensor which is 3/5.  She also has a weakness of the muscle on the right side probably secondary to the previous surgery for the carpal tunnel.  Reflexes symmetrical with absent  in the right bicep.  Coordination is normal.  The MRI showed that this lady had a large herniated disk at the level of 5-6 central and to the right.  IMPRESSION:  C5-6 herniated disk.  RECOMMENDATION:  I talked to the patient at length.  We are going to proceed with an anterior cervical diskectomy followed by bone graft and a plate.  The procedure was fully explained to her and her husband with risks such as infection, CSF leak, bleeding, need for further surgery, damage to the esophagus, damage to the vocal cords, etc. DD:  05/05/00 TD:  05/05/00 Job: [id number]

## 2010-08-06 NOTE — H&P (Signed)
NAME:  Taylor Ritter, Taylor Ritter                         ACCOUNT NO.:  [id number]   MEDICAL RECORD NO.:  [id number]                   PATIENT TYPE:  OIB   LOCATION:  3008                                 FACILITY:  [facility]   PHYSICIAN:  Hilda Lias, M.D.                DATE OF BIRTH:  [birth year]   DATE OF ADMISSION:  08/22/2003  DATE OF DISCHARGE:                                HISTORY & PHYSICAL   HISTORY:  The patient is a lady who had been complaining of back pain with  radiation down to the left leg since the beginning of April of this year.  The patient has complained of no pain whatsoever in the right leg.  The pain  is associated with numbness in the left foot.  The patient has had  conservative treatment, including epidural injection, without any  improvement.  Because of the findings, she and her husband have decided to  go ahead with surgery.   PAST MEDICAL/SURGICAL HISTORY:  1. Anterior cervical fusion by me in 2002.  2. Carpal tunnel surgery.  3. Cholecystectomy.  4. Esophageal stretch maneuver.   ALLERGIES:  CODEINE.   SOCIAL HISTORY:  Negative.   FAMILY HISTORY:  Father is with diabetes, and the mother with asthma.   REVIEW OF SYSTEMS:  Positive for blood in the urine, back pain and high  blood pressure.   PHYSICAL EXAMINATION:  GENERAL:  The patient came to my office with her  daughter first and later on with her husband.  She was having quite a bit of  pain, and she was limping from the left leg.  HEENT:  Normal.  NECK:  A scar was seen on the left side.  LUNGS:  Clear.  HEART:  Sounds normal.  ABDOMEN:  Normal.  EXTREMITIES:  Normal pulses.  NEUROLOGIC:  Mental status normal.  Cranial nerves normal.  Strength:  Normal except for weakness of the left foot about 3/5 on plantar flexion and  4/5 on dorsiflexion.  There is absence of the left ankle jerk.  The MRI showed that she has a herniated disk, central and to the left,  compromising the S1 nerve root.   CLINICAL IMPRESSION:  Left L5-S1 herniated disk.   RECOMMENDATIONS:  Left L5-S1 herniated disk.   The patient is going to be admitted for a left L5 diskectomy.  She knows  about the risks such as infection, CSF leak, worsening of the pain,  paralysis, need for further surgery and damage to the vessels of the  abdomen.                                                Hilda Lias, M.D.    EB/MEDQ  D:  08/22/2003  T:  08/23/2003  Job:  [id number]

## 2010-08-06 NOTE — Op Note (Signed)
[place]. [facility] [place]  Patient:    Taylor Ritter, Taylor Ritter                      MRN: [id number] Proc. Date: 05/05/00 Adm. Date:  [id number] Attending:  Danella Penton                           Operative Report  PREOPERATIVE DIAGNOSIS:  C5-C6 herniated disk with C6 radiculopathy.  POSTOPERATIVE DIAGNOSIS:  C5-C6 herniated disk with C6 radiculopathy.  PROCEDURE:  Anterior C5-6 diskectomy, bone graft fusion, plate, microscope.  SURGEON:  Tanya Nones. Jeral Fruit, M.D.  ASSISTANT:  Danae Orleans. Venetia Maxon, M.D.  INDICATIONS:  Ms. Sublette is a lady who was seen by me in my office last week because of neck and right upper extremity pain, up to the point on the biceps on the right side while on _____ _____.  An MRI showed a right herniated disk.  Because of the pain and the weakness, the patient wants to go ahead with surgery as soon as possible.  The patient knows about the risks such as infection, CSF leak, damage to the vocal cords, damage to the esophagus, or need for further surgery.  DESCRIPTION OF PROCEDURE:  The patient was taken to the operating room.  After intubation the left side of the neck was prepped with Betadine.  A transverse incision was made through the skin and platysma, down to the cervical spine. X-ray showed that indeed we were at the level of C5-6.  Immediately we brought the microscope into the area, and we started a microdissection and removing the anterior ligament and degenerative disk.  Indeed on the right side there was an open posterior ligament with at least six to seven pieces of fragments going to the right side.  The removal was accomplished.  There was some spondylosis which was removed with the Kerrison punch, as well as the drill. Having a good decompression bilaterally, a piece of bone graft from the bone bank was inserted.  A lateral C-spine showed a good position of the graft as well as the plate.  The plate was positioned in place,  and secured using four screws.  Investigation at the end showed that the esophagus, trachea, and carotid were normal.  The area was irrigated and closed with Vicryl and Steri-Strips.  The patient did well. DD:  05/05/00 TD:  05/06/00 Job: [id number]

## 2010-08-06 NOTE — H&P (Signed)
NAME:  Taylor Ritter, Taylor Ritter NO.:  [id number]   MEDICAL RECORD NO.:  [id number]          PATIENT TYPE:  INP   LOCATION:                                FACILITY[facility]  [facility]   PHYSICIAN:  Osborn Coho, M.D.   DATE OF BIRTH:  [birth year]   DATE OF ADMISSION:  DATE OF DISCHARGE:                                HISTORY & PHYSICAL   HISTORY OF PRESENT ILLNESS:  Taylor Ritter is a 50 year old married white  female para 1-0-0-1 who presents for a laparoscopically assisted vaginal  hysterectomy with placement of tension-free vaginal tape because of  menorrhagia, dysfunctional uterine bleeding, and stress urinary  incontinence.  Off and on for the past five years (in spite of hormonal  therapy) patient has had periods that last from three days to three weeks.  Her flow will start very light, then progress to saturating her clothing  (through her perineal protection) within a 30-minute timeframe.  There was a  span of two years in which patient was amenorrheic but then her periods  resumed regularly until 2006 when she began to skip periods once again when  patient's flow resumed its irregular and heavy characteristics requiring  patient to change her pad every one to one and a half hours.  Patient was  told by her primary physician that she probably had endometriosis based on  the characteristics of her bleeding pattern.  Additionally, patient has had  to wear a pad constantly because of urine leakage.  This primarily occurs  with coughing and sneezing, but can be brought on by any movement.  Cystometrics done in February of 2007 confirm that patient had a diagnosis  of stress urinary incontinence.  Since January 2007 patient's TSH has been  normal.  Her endometrial biopsy revealed disordered proliferative  endometrium with no cytologic atypia or malignancy and a sonohysterogram  showed no endometrial masses.  Having been given Provera 10 mg daily since  early January patient has noted  some decrease in her bleeding requiring her  to change a pad only every two to three hours; however, the length of her  flow remains extensive.  Patient has been given both medical and surgical  options for management of her bleeding; however, she chooses to proceed with  definitive therapy in the form of laparoscopically assisted vaginal  hysterectomy and states that if endometriosis is found she would also like  her ovaries removed.  Due to her symptoms of urinary incontinence patient  has also consented to proceed with the placement of tension-free vaginal  tape.   PAST MEDICAL HISTORY:   OB HISTORY:  Gravida 1, para 1-0-0-1.  Patient delivered by cesarean section  in 1983.   GYN HISTORY:  Menarche at 50 years old.  Last menstrual period May 30, 2005.  Patient uses condoms as a method of contraception.  Denies any  history of abnormal Pap smears or sexually transmitted diseases.  Her last  normal Pap smear was November 2006, normal mammogram December 2006.   MEDICAL HISTORY:  1.  Hypertension.  2.  Anemia.  3.  Asthma.  4.  Gastroesophageal reflux disease.  5.  Depression.  6.  Chronic back pain (degenerative disk disease).  7.  Carpal tunnel syndrome.   SURGICAL HISTORY:  1.  1996 left carpal tunnel surgery.  2.  1998 esophageal stretching.  3.  2000 neck surgery at level of C5 and 6.  4.  2004 cholecystectomy.  5.  2005 lumbar surgery at the level of L4-5.  6.  2006 right knee surgery.   FAMILY HISTORY:  Positive for stroke, hypertension, diabetes mellitus,  thyroid disease, lung cancer, and brain tumor.   SOCIAL HISTORY:  Patient is married and she lives with her husband and three  grandchildren.   HABITS:  She does not use tobacco or alcohol.   CURRENT MEDICATIONS:  1.  Nexium 40 mg daily.  2.  Celebrex one tablet daily.  3.  Iron 65 mg twice daily.  4.  Cymbalta 60 mg daily.  5.  Wellbutrin SR 300 mg daily.  6.  Atacand/HCT 16/12.5 one tablet daily.  7.   Allegra 180 mg daily as needed.  8.  Allergy injections every two weeks.  9.  Nasonex one spray in each nostril daily.  10. Medroxyprogesterone acetate 10 mg daily.   Patient is allergic to CODEINE which causes her hives.   REVIEW OF SYSTEMS:  Patient denies any chest pain, shortness of breath, any  GI symptoms, fever, chills, nausea, vomiting, or diarrhea.  Otherwise,  except as mentioned in history of present illness review of systems is  negative.   PHYSICAL EXAMINATION:  VITAL SIGNS:  Blood pressure 130/86, height 5 feet 7-  1/2 inches tall, weight 298 pounds.  NECK:  Supple without masses.  There is no thyromegaly or cervical  adenopathy.  HEART:  Regular rate and rhythm.  There is no murmur.  LUNGS:  Clear to auscultation.  BACK:  No CVA tenderness.  ABDOMEN:  Bowel sounds are present.  It is soft without tenderness, masses,  or organomegaly.  EXTREMITIES:  Without clubbing, cyanosis, or edema.  PELVIC:  EGBUS is within normal limits.  Vagina is rugose.  Cervix is  nontender without lesions.  Uterus appears normal size, shape, and  consistency.  Adnexa without tenderness or masses.   IMPRESSION:  1.  Dysfunctional uterine bleeding.  2.  Menorrhagia.  3.  Stress urinary incontinence.   DISPOSITION:  A discussion was held with patient regarding the indications  for her procedure along with its risks which include, but are not limited  to, reaction to anesthesia, damage to adjacent organs, infection, excessive  bleeding, the possibility of erosion of the tension-free vaginal tape that  her urinary incontinence may worsen or not improve with the procedure and  that should her ovaries need to be removed she may be in need of estrogen  replacement therapy and the risks involved.  Patient has consented to  proceed with a laparoscopically assisted vaginal hysterectomy with placement  of tension-free vaginal tape and the possibility of bilateral salpingo- oophorectomy at [facility] [place] on July 01, 2005 at 9:30  a.m.      [place] J. Adline Peals.      Osborn Coho, M.D.  Electronically Signed    EJP/MEDQ  D:  06/24/2005  T:  06/24/2005  Job:  [id number]

## 2011-01-10 ENCOUNTER — Ambulatory Visit
Admission: RE | Admit: 2011-01-10 | Discharge: 2011-01-10 | Disposition: A | Discharge status: Home / Self Care | Payer: Medicare Other | Source: Ambulatory Visit | Attending: Neurosurgery | Admitting: Neurosurgery

## 2011-01-10 ENCOUNTER — Other Ambulatory Visit: Payer: Self-pay | Admitting: Neurosurgery

## 2011-01-10 DIAGNOSIS — M549 Dorsalgia, unspecified: Secondary | ICD-10-CM

## 2011-01-10 MED ORDER — GADOBENATE DIMEGLUMINE 529 MG/ML IV SOLN
20.0000 mL | Freq: Once | INTRAVENOUS | Status: AC | PRN
  Administered 2011-01-10: 20 mL via INTRAVENOUS

## 2011-01-27 ENCOUNTER — Other Ambulatory Visit: Payer: Self-pay | Admitting: Neurosurgery

## 2011-02-11 ENCOUNTER — Encounter (HOSPITAL_COMMUNITY): Payer: Self-pay | Admitting: Pharmacy Technician

## 2011-02-17 ENCOUNTER — Encounter (HOSPITAL_COMMUNITY): Payer: Self-pay

## 2011-02-17 ENCOUNTER — Other Ambulatory Visit: Payer: Self-pay

## 2011-02-17 ENCOUNTER — Encounter (HOSPITAL_COMMUNITY)
Admission: RE | Admit: 2011-02-17 | Discharge: 2011-02-17 | Disposition: A | Discharge status: Home / Self Care | Payer: Medicare Other | Source: Ambulatory Visit | Attending: Neurosurgery | Admitting: Neurosurgery

## 2011-02-17 ENCOUNTER — Encounter (HOSPITAL_COMMUNITY)
Admission: RE | Admit: 2011-02-17 | Discharge: 2011-02-17 | Disposition: A | Discharge status: Home / Self Care | Payer: Medicare Other | Source: Ambulatory Visit | Attending: Anesthesiology | Admitting: Anesthesiology

## 2011-02-17 DIAGNOSIS — Z01810 Encounter for preprocedural cardiovascular examination: Secondary | ICD-10-CM | POA: Insufficient documentation

## 2011-02-17 DIAGNOSIS — Z01812 Encounter for preprocedural laboratory examination: Secondary | ICD-10-CM | POA: Insufficient documentation

## 2011-02-17 DIAGNOSIS — Z01818 Encounter for other preprocedural examination: Secondary | ICD-10-CM | POA: Insufficient documentation

## 2011-02-17 HISTORY — DX: Unspecified osteoarthritis, unspecified site: M19.90

## 2011-02-17 HISTORY — DX: Myoneural disorder, unspecified: G70.9

## 2011-02-17 HISTORY — DX: Essential (primary) hypertension: I10

## 2011-02-17 HISTORY — DX: Fibromyalgia: M79.7

## 2011-02-17 HISTORY — DX: Gastro-esophageal reflux disease without esophagitis: K21.9

## 2011-02-17 HISTORY — DX: Depression, unspecified: F32.A

## 2011-02-17 HISTORY — DX: Major depressive disorder, single episode, unspecified: F32.9

## 2011-02-17 LAB — BASIC METABOLIC PANEL
CO2: 30 mEq/L (ref 19–32)
Calcium: 9.8 mg/dL (ref 8.4–10.5)
Creatinine, Ser: 0.92 mg/dL (ref 0.50–1.10)
Glucose, Bld: 90 mg/dL (ref 70–99)

## 2011-02-17 LAB — CBC
MCH: 30 pg (ref 26.0–34.0)
MCHC: 32.2 g/dL (ref 30.0–36.0)
MCV: 93.3 fL (ref 78.0–100.0)
Platelets: 326 10*3/uL (ref 150–400)
RDW: 13.6 % (ref 11.5–15.5)

## 2011-02-17 NOTE — Pre-Procedure Instructions (Signed)
20 CODA FILLER  02/17/2011   Your procedure is scheduled on:  Feb 25 2011 Report to [name] at 0600 AM.  Call this number if you have problems the morning of surgery: [phone]   Remember:   Do not eat food:After Midnight.  May have clear liquids: up to 4 Hours before arrival.  Clear liquids include soda, tea, black coffee, apple or grape juice, broth.  Take these medicines the morning of surgery with A SIP OF WATER:NEXIUM,WELLBUTRIN,INHALER AS DESIRED   Do not wear jewelry, make-up or nail polish.  Do not wear lotions, powders, or perfumes. You may wear deodorant.  Do not shave 48 hours prior to surgery.  Do not bring valuables to the hospital.  Contacts, dentures or bridgework may not be worn into surgery.  Leave suitcase in the car. After surgery it may be brought to your room.  For patients admitted to the hospital, checkout time is 11:00 AM the day of discharge.   Patients discharged the day of surgery will not be allowed to drive home.  Name and phone number of your driver:SPOUSE  Special Instructions: CHG Shower Use Special Wash: 1/2 bottle night before surgery and 1/2 bottle morning of surgery.   Please read over the following fact sheets that you were given: Pain Booklet, Coughing and Deep Breathing, MRSA Information and Surgical Site Infection Prevention

## 2011-02-24 MED ORDER — CEFAZOLIN SODIUM-DEXTROSE 2-3 GM-% IV SOLR
2.0000 g | INTRAVENOUS | Status: AC
  Administered 2011-02-25: 2 g via INTRAVENOUS
  Filled 2011-02-24: qty 50

## 2011-02-25 ENCOUNTER — Encounter (HOSPITAL_COMMUNITY)
Admission: RE | Disposition: A | Discharge status: Home / Self Care | Payer: Self-pay | Source: Ambulatory Visit | Attending: Neurosurgery

## 2011-02-25 ENCOUNTER — Encounter (HOSPITAL_COMMUNITY): Payer: Self-pay | Admitting: Anesthesiology

## 2011-02-25 ENCOUNTER — Ambulatory Visit (HOSPITAL_COMMUNITY): Payer: Medicare Other | Admitting: Anesthesiology

## 2011-02-25 ENCOUNTER — Inpatient Hospital Stay (HOSPITAL_COMMUNITY)
Admission: RE | Admit: 2011-02-25 | Discharge: 2011-02-26 | DRG: 491 | Disposition: A | Discharge status: Home / Self Care | Payer: Medicare Other | Source: Ambulatory Visit | Attending: Neurosurgery | Admitting: Neurosurgery

## 2011-02-25 ENCOUNTER — Ambulatory Visit (HOSPITAL_COMMUNITY): Payer: Medicare Other

## 2011-02-25 ENCOUNTER — Encounter (HOSPITAL_COMMUNITY): Payer: Self-pay | Admitting: *Deleted

## 2011-02-25 DIAGNOSIS — E669 Obesity, unspecified: Secondary | ICD-10-CM | POA: Diagnosis present

## 2011-02-25 DIAGNOSIS — M4306 Spondylolysis, lumbar region: Secondary | ICD-10-CM

## 2011-02-25 DIAGNOSIS — IMO0001 Reserved for inherently not codable concepts without codable children: Secondary | ICD-10-CM | POA: Diagnosis present

## 2011-02-25 DIAGNOSIS — M48061 Spinal stenosis, lumbar region without neurogenic claudication: Principal | ICD-10-CM | POA: Diagnosis present

## 2011-02-25 HISTORY — PX: LUMBAR LAMINECTOMY/DECOMPRESSION MICRODISCECTOMY: SHX5026

## 2011-02-25 SURGERY — LUMBAR LAMINECTOMY/DECOMPRESSION MICRODISCECTOMY
Anesthesia: General | Site: Back | Wound class: Clean

## 2011-02-25 MED ORDER — HYDROCHLOROTHIAZIDE 25 MG PO TABS
25.0000 mg | ORAL_TABLET | Freq: Every day | ORAL | Status: DC
  Filled 2011-02-25: qty 1

## 2011-02-25 MED ORDER — FLUTICASONE FUROATE 27.5 MCG/SPRAY NA SUSP: 2.0000 | Freq: Every day | NASAL | Status: DC

## 2011-02-25 MED ORDER — HYDROMORPHONE HCL PF 1 MG/ML IJ SOLN
INTRAMUSCULAR | Status: AC
  Administered 2011-02-25: 0.5 mg
  Filled 2011-02-25: qty 1

## 2011-02-25 MED ORDER — ROCURONIUM BROMIDE 100 MG/10ML IV SOLN
INTRAVENOUS | Status: DC | PRN
  Administered 2011-02-25: 50 mg via INTRAVENOUS

## 2011-02-25 MED ORDER — MEPERIDINE HCL 25 MG/ML IJ SOLN: 6.2500 mg | INTRAMUSCULAR | Status: DC | PRN

## 2011-02-25 MED ORDER — ACETAMINOPHEN 325 MG PO TABS: 650.0000 mg | ORAL_TABLET | ORAL | Status: DC | PRN

## 2011-02-25 MED ORDER — SODIUM CHLORIDE 0.9 % IJ SOLN
3.0000 mL | INTRAMUSCULAR | Status: DC | PRN
  Administered 2011-02-25: 3 mL via INTRAVENOUS

## 2011-02-25 MED ORDER — SODIUM CHLORIDE 0.9 % IV SOLN: 250.0000 mL | INTRAVENOUS | Status: DC

## 2011-02-25 MED ORDER — HEMOSTATIC AGENTS (NO CHARGE) OPTIME
TOPICAL | Status: DC | PRN
  Administered 2011-02-25: 1 via TOPICAL

## 2011-02-25 MED ORDER — SODIUM CHLORIDE 0.9 % IV SOLN
INTRAVENOUS | Status: DC
  Administered 2011-02-25: 20:00:00 via INTRAVENOUS

## 2011-02-25 MED ORDER — LORATADINE 10 MG PO TABS
10.0000 mg | ORAL_TABLET | Freq: Every day | ORAL | Status: DC
  Administered 2011-02-26: 10 mg via ORAL
  Filled 2011-02-25: qty 1

## 2011-02-25 MED ORDER — DOCUSATE SODIUM 100 MG PO CAPS
100.0000 mg | ORAL_CAPSULE | Freq: Two times a day (BID) | ORAL | Status: DC
  Administered 2011-02-25 – 2011-02-26 (×2): 100 mg via ORAL
  Filled 2011-02-25 (×2): qty 1

## 2011-02-25 MED ORDER — MOMETASONE FURO-FORMOTEROL FUM 200-5 MCG/ACT IN AERO
1.0000 | INHALATION_SPRAY | Freq: Two times a day (BID) | RESPIRATORY_TRACT | Status: DC
  Administered 2011-02-25 – 2011-02-26 (×2): 1 via RESPIRATORY_TRACT

## 2011-02-25 MED ORDER — BUPIVACAINE LIPOSOME 1.3 % IJ SUSP
20.0000 mL | INTRAMUSCULAR | Status: AC
  Administered 2011-02-25: 20 mL
  Filled 2011-02-25: qty 20

## 2011-02-25 MED ORDER — NEOSTIGMINE METHYLSULFATE 1 MG/ML IJ SOLN
INTRAMUSCULAR | Status: DC | PRN
  Administered 2011-02-25: 4 mg via INTRAVENOUS

## 2011-02-25 MED ORDER — ALPRAZOLAM 0.5 MG PO TABS: 1.0000 mg | ORAL_TABLET | Freq: Three times a day (TID) | ORAL | Status: DC | PRN

## 2011-02-25 MED ORDER — CEFAZOLIN SODIUM 1-5 GM-% IV SOLN
1.0000 g | Freq: Three times a day (TID) | INTRAVENOUS | Status: AC
  Administered 2011-02-25 – 2011-02-26 (×2): 1 g via INTRAVENOUS
  Filled 2011-02-25 (×2): qty 50

## 2011-02-25 MED ORDER — MENTHOL 3 MG MT LOZG: 1.0000 | LOZENGE | OROMUCOSAL | Status: DC | PRN

## 2011-02-25 MED ORDER — DIPHENHYDRAMINE HCL 25 MG PO CAPS
50.0000 mg | ORAL_CAPSULE | Freq: Four times a day (QID) | ORAL | Status: DC | PRN
  Administered 2011-02-25 – 2011-02-26 (×3): 50 mg via ORAL
  Filled 2011-02-25 (×3): qty 2

## 2011-02-25 MED ORDER — AZELASTINE HCL 0.1 % NA SOLN
1.0000 | Freq: Two times a day (BID) | NASAL | Status: DC
  Administered 2011-02-25 – 2011-02-26 (×2): 1 via NASAL
  Filled 2011-02-25: qty 30

## 2011-02-25 MED ORDER — HYDROMORPHONE HCL PF 1 MG/ML IJ SOLN
0.2500 mg | INTRAMUSCULAR | Status: DC | PRN
  Administered 2011-02-25: 0.5 mg via INTRAVENOUS

## 2011-02-25 MED ORDER — THROMBIN 5000 UNITS EX KIT
PACK | CUTANEOUS | Status: DC | PRN
  Administered 2011-02-25: 5000 [IU] via TOPICAL

## 2011-02-25 MED ORDER — OXYCODONE-ACETAMINOPHEN 5-325 MG PO TABS: 1.0000 | ORAL_TABLET | ORAL | Status: DC | PRN

## 2011-02-25 MED ORDER — LIDOCAINE HCL (CARDIAC) 20 MG/ML IV SOLN
INTRAVENOUS | Status: DC | PRN
  Administered 2011-02-25: 50 mg via INTRAVENOUS

## 2011-02-25 MED ORDER — HYDROMORPHONE HCL PF 1 MG/ML IJ SOLN
0.5000 mg | INTRAMUSCULAR | Status: DC | PRN
  Administered 2011-02-25 – 2011-02-26 (×5): 1 mg via INTRAVENOUS
  Filled 2011-02-25 (×5): qty 1

## 2011-02-25 MED ORDER — GLYCOPYRROLATE 0.2 MG/ML IJ SOLN
INTRAMUSCULAR | Status: DC | PRN
  Administered 2011-02-25: .8 mg via INTRAVENOUS

## 2011-02-25 MED ORDER — CYCLOBENZAPRINE HCL 10 MG PO TABS: 10.0000 mg | ORAL_TABLET | Freq: Three times a day (TID) | ORAL | Status: DC | PRN

## 2011-02-25 MED ORDER — MEPERIDINE HCL 25 MG/ML IJ SOLN
6.2500 mg | INTRAMUSCULAR | Status: DC | PRN
Start: 2011-02-25 — End: 2011-02-25

## 2011-02-25 MED ORDER — NITROFURANTOIN MACROCRYSTAL 100 MG PO CAPS
100.0000 mg | ORAL_CAPSULE | Freq: Every day | ORAL | Status: DC
  Administered 2011-02-26: 100 mg via ORAL
  Filled 2011-02-25 (×2): qty 1

## 2011-02-25 MED ORDER — FENTANYL CITRATE 0.05 MG/ML IJ SOLN
INTRAMUSCULAR | Status: DC | PRN
  Administered 2011-02-25: 100 ug via INTRAVENOUS

## 2011-02-25 MED ORDER — PROMETHAZINE HCL 25 MG/ML IJ SOLN: 6.2500 mg | INTRAMUSCULAR | Status: DC | PRN

## 2011-02-25 MED ORDER — FLUTICASONE PROPIONATE 50 MCG/ACT NA SUSP
2.0000 | Freq: Every day | NASAL | Status: DC
  Administered 2011-02-26: 2 via NASAL
  Filled 2011-02-25: qty 16

## 2011-02-25 MED ORDER — ACETAMINOPHEN 650 MG RE SUPP: 650.0000 mg | RECTAL | Status: DC | PRN

## 2011-02-25 MED ORDER — VECURONIUM BROMIDE 10 MG IV SOLR
INTRAVENOUS | Status: DC | PRN
  Administered 2011-02-25: 2 mg via INTRAVENOUS
  Administered 2011-02-25 (×3): 1 mg via INTRAVENOUS

## 2011-02-25 MED ORDER — HYDROCODONE-ACETAMINOPHEN 5-325 MG PO TABS: 1.0000 | ORAL_TABLET | ORAL | Status: DC | PRN

## 2011-02-25 MED ORDER — FENTANYL CITRATE 0.05 MG/ML IJ SOLN
INTRAMUSCULAR | Status: DC | PRN
  Administered 2011-02-25: 150 ug via INTRAVENOUS
  Administered 2011-02-25 (×2): 100 ug via INTRAVENOUS
  Administered 2011-02-25: 150 ug via INTRAVENOUS

## 2011-02-25 MED ORDER — DIPHENHYDRAMINE HCL 50 MG/ML IJ SOLN
INTRAMUSCULAR | Status: AC
  Administered 2011-02-25: 25 mg
  Filled 2011-02-25: qty 1

## 2011-02-25 MED ORDER — METHYLPREDNISOLONE ACETATE 80 MG/ML IJ SUSP
INTRAMUSCULAR | Status: DC | PRN
  Administered 2011-02-25: 80 mg

## 2011-02-25 MED ORDER — DULOXETINE HCL 60 MG PO CPEP
120.0000 mg | ORAL_CAPSULE | Freq: Two times a day (BID) | ORAL | Status: DC
  Administered 2011-02-25 – 2011-02-26 (×2): 120 mg via ORAL
  Filled 2011-02-25 (×3): qty 2

## 2011-02-25 MED ORDER — VALACYCLOVIR HCL 500 MG PO TABS
500.0000 mg | ORAL_TABLET | Freq: Two times a day (BID) | ORAL | Status: DC
  Administered 2011-02-25 – 2011-02-26 (×2): 500 mg via ORAL
  Filled 2011-02-25 (×3): qty 1

## 2011-02-25 MED ORDER — HYDROMORPHONE HCL PF 1 MG/ML IJ SOLN
0.2500 mg | INTRAMUSCULAR | Status: DC | PRN
  Administered 2011-02-25 (×3): 0.5 mg via INTRAVENOUS

## 2011-02-25 MED ORDER — MIDAZOLAM HCL 5 MG/5ML IJ SOLN
INTRAMUSCULAR | Status: DC | PRN
  Administered 2011-02-25: 2 mg via INTRAVENOUS

## 2011-02-25 MED ORDER — ZOLPIDEM TARTRATE 10 MG PO TABS: 10.0000 mg | ORAL_TABLET | Freq: Every evening | ORAL | Status: DC | PRN

## 2011-02-25 MED ORDER — PROPOFOL 10 MG/ML IV EMUL
INTRAVENOUS | Status: DC | PRN
  Administered 2011-02-25: 200 mg via INTRAVENOUS

## 2011-02-25 MED ORDER — PANTOPRAZOLE SODIUM 40 MG PO TBEC
40.0000 mg | DELAYED_RELEASE_TABLET | Freq: Every day | ORAL | Status: DC
  Administered 2011-02-25 – 2011-02-26 (×2): 40 mg via ORAL
  Filled 2011-02-25 (×2): qty 1

## 2011-02-25 MED ORDER — LACTATED RINGERS IV SOLN
INTRAVENOUS | Status: DC | PRN
  Administered 2011-02-25 (×2): via INTRAVENOUS

## 2011-02-25 MED ORDER — SODIUM CHLORIDE 0.9 % IJ SOLN
3.0000 mL | Freq: Two times a day (BID) | INTRAMUSCULAR | Status: DC
  Administered 2011-02-25: 3 mL via INTRAVENOUS

## 2011-02-25 MED ORDER — BUPROPION HCL ER (XL) 150 MG PO TB24
150.0000 mg | ORAL_TABLET | Freq: Three times a day (TID) | ORAL | Status: DC
  Administered 2011-02-25 – 2011-02-26 (×3): 150 mg via ORAL
  Filled 2011-02-25 (×4): qty 1

## 2011-02-25 MED ORDER — ONDANSETRON HCL 4 MG/2ML IJ SOLN: 4.0000 mg | INTRAMUSCULAR | Status: DC | PRN

## 2011-02-25 MED ORDER — HETASTARCH-ELECTROLYTES 6 % IV SOLN
INTRAVENOUS | Status: DC | PRN
  Administered 2011-02-25: 10:00:00 via INTRAVENOUS

## 2011-02-25 MED ORDER — PHENOL 1.4 % MT LIQD
1.0000 | OROMUCOSAL | Status: DC | PRN
  Administered 2011-02-26: 1 via OROMUCOSAL
  Filled 2011-02-25: qty 177

## 2011-02-25 MED ORDER — ONDANSETRON HCL 4 MG/2ML IJ SOLN
INTRAMUSCULAR | Status: DC | PRN
  Administered 2011-02-25: 4 mg via INTRAVENOUS

## 2011-02-25 MED ORDER — SODIUM CHLORIDE 0.9 % IR SOLN
Status: DC | PRN
  Administered 2011-02-25: 1000 mL

## 2011-02-25 SURGICAL SUPPLY — 53 items
BENZOIN TINCTURE PRP APPL 2/3 (GAUZE/BANDAGES/DRESSINGS) ×2 IMPLANT
BLADE SURG ROTATE 9660 (MISCELLANEOUS) IMPLANT
BUR ACORN 6.0 (BURR) ×2 IMPLANT
BUR MATCHSTICK NEURO 3.0 LAGG (BURR) ×2 IMPLANT
CANISTER SUCTION 2500CC (MISCELLANEOUS) ×2 IMPLANT
CLOTH BEACON ORANGE TIMEOUT ST (SAFETY) ×2 IMPLANT
CONT SPEC 4OZ CLIKSEAL STRL BL (MISCELLANEOUS) ×2 IMPLANT
DRAPE LAPAROTOMY 100X72X124 (DRAPES) ×2 IMPLANT
DRAPE MICROSCOPE LEICA (MISCELLANEOUS) ×2 IMPLANT
DRAPE POUCH INSTRU U-SHP 10X18 (DRAPES) ×2 IMPLANT
DRSG PAD ABDOMINAL 8X10 ST (GAUZE/BANDAGES/DRESSINGS) ×2 IMPLANT
DURAPREP 26ML APPLICATOR (WOUND CARE) ×2 IMPLANT
ELECT BLADE 4.0 EZ CLEAN MEGAD (MISCELLANEOUS) ×2
ELECT REM PT RETURN 9FT ADLT (ELECTROSURGICAL) ×2
ELECTRODE BLDE 4.0 EZ CLN MEGD (MISCELLANEOUS) ×1 IMPLANT
ELECTRODE REM PT RTRN 9FT ADLT (ELECTROSURGICAL) ×1 IMPLANT
GAUZE SPONGE 4X4 16PLY XRAY LF (GAUZE/BANDAGES/DRESSINGS) ×2 IMPLANT
GLOVE BIOGEL M 8.0 STRL (GLOVE) ×2 IMPLANT
GLOVE BIOGEL PI IND STRL 7.0 (GLOVE) ×1 IMPLANT
GLOVE BIOGEL PI INDICATOR 7.0 (GLOVE) ×1
GLOVE EXAM NITRILE LRG STRL (GLOVE) IMPLANT
GLOVE EXAM NITRILE MD LF STRL (GLOVE) IMPLANT
GLOVE EXAM NITRILE XL STR (GLOVE) IMPLANT
GLOVE EXAM NITRILE XS STR PU (GLOVE) IMPLANT
GLOVE SURG SS PI 6.5 STRL IVOR (GLOVE) ×4 IMPLANT
GOWN BRE IMP SLV AUR LG STRL (GOWN DISPOSABLE) ×2 IMPLANT
GOWN BRE IMP SLV AUR XL STRL (GOWN DISPOSABLE) ×2 IMPLANT
GOWN STRL REIN 2XL LVL4 (GOWN DISPOSABLE) ×2 IMPLANT
KIT BASIN OR (CUSTOM PROCEDURE TRAY) ×2 IMPLANT
KIT ROOM TURNOVER OR (KITS) ×2 IMPLANT
NEEDLE HYPO 18GX1.5 BLUNT FILL (NEEDLE) ×2 IMPLANT
NEEDLE HYPO 21X1.5 SAFETY (NEEDLE) ×2 IMPLANT
NEEDLE HYPO 25X1 1.5 SAFETY (NEEDLE) ×2 IMPLANT
NEEDLE SPNL 20GX3.5 QUINCKE YW (NEEDLE) ×2 IMPLANT
NS IRRIG 1000ML POUR BTL (IV SOLUTION) ×2 IMPLANT
PACK LAMINECTOMY NEURO (CUSTOM PROCEDURE TRAY) ×2 IMPLANT
PAD ARMBOARD 7.5X6 YLW CONV (MISCELLANEOUS) ×6 IMPLANT
PATTIES SURGICAL .5 X1 (DISPOSABLE) ×2 IMPLANT
RUBBERBAND STERILE (MISCELLANEOUS) ×4 IMPLANT
SPONGE GAUZE 4X4 12PLY (GAUZE/BANDAGES/DRESSINGS) ×2 IMPLANT
SPONGE LAP 4X18 X RAY DECT (DISPOSABLE) ×2 IMPLANT
SPONGE SURGIFOAM ABS GEL SZ50 (HEMOSTASIS) ×2 IMPLANT
STRIP CLOSURE SKIN 1/2X4 (GAUZE/BANDAGES/DRESSINGS) ×2 IMPLANT
SUT VIC AB 0 CT1 18XCR BRD8 (SUTURE) ×2 IMPLANT
SUT VIC AB 0 CT1 8-18 (SUTURE) ×2
SUT VIC AB 2-0 CP2 18 (SUTURE) ×2 IMPLANT
SUT VIC AB 3-0 SH 8-18 (SUTURE) ×2 IMPLANT
SYR 20CC LL (SYRINGE) ×2 IMPLANT
SYR 20ML ECCENTRIC (SYRINGE) ×2 IMPLANT
SYR 5ML LL (SYRINGE) ×2 IMPLANT
TOWEL OR 17X24 6PK STRL BLUE (TOWEL DISPOSABLE) ×2 IMPLANT
TOWEL OR 17X26 10 PK STRL BLUE (TOWEL DISPOSABLE) ×2 IMPLANT
WATER STERILE IRR 1000ML POUR (IV SOLUTION) ×2 IMPLANT

## 2011-02-25 NOTE — Op Note (Signed)
NAMEMarland Kitchen  TONETTE, KOEHNE NO.:  [id number]  MEDICAL RECORD NO.:  [id number]  LOCATION:  3014                         FACILITY:  [facility]  PHYSICIAN:  Hilda Lias, M.D.   DATE OF BIRTH:  03-Dec-1960  DATE OF PROCEDURE:  02/25/2011 DATE OF DISCHARGE:                              OPERATIVE REPORT   PREOPERATIVE DIAGNOSIS:  Lumbar stenosis with bilateral radiculopathy, status post left L5-S1 diskectomy.  POSTOPERATIVE DIAGNOSIS:  Lumbar stenosis with bilateral radiculopathy, status post left L5-S1 diskectomy.  PROCEDURE:  Bilateral L4-5 laminectomy, lysis of adhesions, foraminotomy to decompress the L4, L5, and S1 nerve root.  Microscope.  SURGEON:  Hilda Lias, MD  ASSISTANT:  Stefani Dama, MD  CLINICAL HISTORY:  Ms. Amyx is a 50 year old obese female who many years ago underwent surgery at the L5-1 on the left side.  Lately, she had been complaining of back pain radiating to both legs.  She had failed conservative treatment including epidural injection.  X-ray showed that she has severe stenosis at L4-5.  Surgery was advised, and the risk was fully explained to her including the possibility of no improvement, CSF leak, infection, need for further surgery which might require fusion.  She also was advised about the importance of possibly losing weight.  PROCEDURE:  The patient was taken to the OR and after intubation, she was positioned in a prone manner.  The back was cleaned with DuraPrep. Drapes were applied.  A midline incision dissecting the previous one was made, 1 inch above and 1 inch below the previous one.  Incision was carried out all the way down to the thick adipose tissue straight down to the fascia.  Muscles and fascia were retracted laterally.  X-rays showed that indeed we were right at the level of L4-5.  Using the Leksell, we removed the spinous process of L4-5.  We had to bring the microscope for better visualization of the area.  Using  the drill, we did a bilateral laminectomy at L4-5.  The patient had quite a bit of adhesion in the left side, mostly at the level of L5-1.  Lysis was accomplished.  Then with the drill as well as the 2, 3, and 4-mm Kerrison punch, bilateral laminotomy was accomplished with foraminotomy. Good decompression was achieved.  Repeat x-rays showed that we were at the target area.  The area was irrigated.  Valsalva maneuver was negative.  Fentanyl and Depo-Medrol were left in the epidural space, and the wound was closed with Vicryl and Steri-Strips.         ______________________________ Hilda Lias, M.D.    EB/MEDQ  D:  02/25/2011  T:  02/25/2011  Job:  [id number]

## 2011-02-25 NOTE — Anesthesia Preprocedure Evaluation (Addendum)
Anesthesia Evaluation  Patient identified by MRN, date of birth, ID band Patient awake    Reviewed: Allergy & Precautions, H&P , NPO status , Patient's Chart, lab work & pertinent test results, reviewed documented beta blocker date and time   Airway Mallampati: III  Neck ROM: Limited    Dental  (+) Teeth Intact   Pulmonary asthma ,  clear to auscultation        Cardiovascular hypertension, Pt. on medications Regular Normal    Neuro/Psych PSYCHIATRIC DISORDERS Depression  Neuromuscular disease    GI/Hepatic GERD-  ,  Endo/Other  Morbid obesity  Renal/GU      Musculoskeletal  (+) Fibromyalgia -  Abdominal (+) obese,   Peds  Hematology   Anesthesia Other Findings   Reproductive/Obstetrics                         Anesthesia Physical Anesthesia Plan  ASA: III  Anesthesia Plan: General   Post-op Pain Management:    Induction: Intravenous  Airway Management Planned: Oral ETT  Additional Equipment:   Intra-op Plan:   Post-operative Plan: Extubation in OR  Informed Consent: I have reviewed the patients History and Physical, chart, labs and discussed the procedure including the risks, benefits and alternatives for the proposed anesthesia with the patient or authorized representative who has indicated his/her understanding and acceptance.   Dental advisory given  Plan Discussed with: CRNA and Surgeon  Anesthesia Plan Comments:        Anesthesia Quick Evaluation

## 2011-02-25 NOTE — Anesthesia Procedure Notes (Signed)
Procedure Name: Intubation Date/Time: 02/25/2011 9:56 AM Performed by: Margaree Mackintosh Pre-anesthesia Checklist: Patient identified, Emergency Drugs available, Suction available, Patient being monitored and Timeout performed Patient Re-evaluated:Patient Re-evaluated prior to inductionOxygen Delivery Method: Circle System Utilized Preoxygenation: Pre-oxygenation with 100% oxygen Intubation Type: IV induction Ventilation: Mask ventilation without difficulty and Oral airway inserted - appropriate to patient size Laryngoscope Size: Miller and 3 Grade View: Grade III Tube type: Oral Tube size: 7.5 mm Number of attempts: 2 Airway Equipment and Method: patient positioned with wedge pillow and stylet Placement Confirmation: ETT inserted through vocal cords under direct vision,  positive ETCO2 and breath sounds checked- equal and bilateral Secured at: 23 cm Tube secured with: Tape Dental Injury: Teeth and Oropharynx as per pre-operative assessment  Difficulty Due To: Difficulty was anticipated, Difficult Airway- due to reduced neck mobility and Difficult Airway- due to limited oral opening

## 2011-02-25 NOTE — Anesthesia Postprocedure Evaluation (Signed)
  Anesthesia Post-op Note  Patient: Taylor Ritter  Procedure(s) Performed:  LUMBAR LAMINECTOMY/DECOMPRESSION MICRODISCECTOMY - Lumbar Four-Five Microdiscectomy  Patient Location: PACU  Anesthesia Type: General  Level of Consciousness: awake  Airway and Oxygen Therapy: Patient Spontanous Breathing  Post-op Pain: mild  Post-op Assessment: Post-op Vital signs reviewed  Post-op Vital Signs: stable  Complications: No apparent anesthesia complications

## 2011-02-25 NOTE — Op Note (Signed)
OPERATIVE NOTE DICTATED NUMBER [id number] on February 25 2011

## 2011-02-25 NOTE — Brief Op Note (Signed)
02/25/2011  12:19 PM  PATIENT:  Taylor Ritter  50 y.o. female  PRE-OPERATIVE DIAGNOSIS:  724.02 Lumbar stenosis  POST-OPERATIVE DIAGNOSIS:  724.02 Lumbar stenosis  PROCEDURE:  Procedure(s): LUMBAR LAMINECTOMY/DECOMPRESSION  l4l5 with foranminotomies  SURGEON:  Surgeon(s): Ola Spurr Elsner  PHYSICIAN ASSISTANT:   ASSISTANTS: elsner  ANESTHESIA:  general   EBL:  Total I/O In: 1900 [I.V.:1400; IV Piggyback:500] Out: 200 [Blood:200]  BLOOD ADMINISTERED:nonenone  DRAINS: none none  LOCAL MEDICATIONS USED:  OTHER exparel SPECIMEN:  No Specimennone  DISPOSITION OF SPECIMEN:  N/Anone  COUNTS:  YEScorrect as told by the circulator rnn  TOURNIQUET:    DICTATION: .Other Dictation: Dictation Number   PLAN OF CARE: Admit to inpatient number [id number]  PATIENT DISPOSITION:  PACU - hemodynamically stable.pacu   Delay start of Pharmacological VTE agent (>24hrs) due to surgical blood loss or risk of bleeding:  {YES/NO/NOT APPLICABLE:20182yes

## 2011-02-25 NOTE — Preoperative (Signed)
Beta Blockers   Reason not to administer Beta Blockers:Not Applicable. No home beta blockers 

## 2011-02-25 NOTE — Transfer of Care (Signed)
Immediate Anesthesia Transfer of Care Note  Patient: Taylor Ritter  Procedure(s) Performed:  LUMBAR LAMINECTOMY/DECOMPRESSION MICRODISCECTOMY - Lumbar Four-Five Microdiscectomy  Patient Location: PACU  Anesthesia Type: General  Level of Consciousness: sedated  Airway & Oxygen Therapy: Patient connected to face mask oxygen  Post-op Assessment: Report given to PACU RN and Post -op Vital signs reviewed and stable  Post vital signs: Reviewed and stable  Complications: No apparent anesthesia complications

## 2011-02-25 NOTE — H&P (Signed)
Taylor Ritter is an 50 y.o. female.   Chief Complaint: lower back pain HPI: patient underwent surgery at l5s1 in the past.now lower back pain with extension to both legs. Failure with conservative treatment.epidural injections of no help.  Past Medical History  Diagnosis Date  . Hypertension     followed by [facility]  . Asthma     daily inhalers allegy shots.followed by [facility]  . GERD (gastroesophageal reflux disease)     on nexium  . Neuromuscular disorder     numbness arms legs,feet   . Fibromyalgia   . Arthritis     osteoporosis  . Depression     Past Surgical History  Procedure Date  . Abdominal hysterectomy   . Cholecystectomy   . Knee arthroscopy     L KNEE  . Carpal tunnel release     R&L  . Anterior fusion cervical spine   . Lumbar laminectomy   . Esophageal dilation   . Cesarean section 1983  . Septoplasty 10+yrs ago    No family history on file. Social History:  reports that she has never smoked. She does not have any smokeless tobacco history on file. She reports that she does not drink alcohol or use illicit drugs.  Allergies:  Allergies  Allergen Reactions  . Codeine Hives  . Morphine And Related Hives and Itching  . Other Other (See Comments)    Nylon sutures had to remove to allow healing  . Vesicare (Solifenacin Succinate) Itching    Medications Prior to Admission  Medication Dose Route Frequency Provider Last Rate Last Dose  . ceFAZolin (ANCEF) IVPB 2 g/50 mL premix  2 g Intravenous 60 min Pre-Op Tanya Nones Cartier Washko      . HYDROmorphone (DILAUDID) injection 0.25-0.5 mg  0.25-0.5 mg Intravenous Q5 min PRN Josepha Pigg, MD      . meperidine (DEMEROL) injection 6.25-12.5 mg  6.25-12.5 mg Intravenous Q5 min PRN Josepha Pigg, MD      . promethazine (PHENERGAN) injection 6.25-12.5 mg  6.25-12.5 mg Intravenous Q15 min PRN Josepha Pigg, MD       Medications Prior to Admission  Medication  Sig Dispense Refill  . valACYclovir (VALTREX) 500 MG tablet Take 500 mg by mouth 2 (two) times daily.          No results found for this or any previous visit (from the past 48 hour(s)). No results found.  Review of Systems  Constitutional: Negative.   HENT: Negative.   Eyes: Negative.   Respiratory: Negative.   Cardiovascular: Negative.   Gastrointestinal: Negative.   Genitourinary: Negative.   Musculoskeletal: Myalgias: lower back pain with extension to both legs.  Skin: Negative.   Neurological: Negative.   Endo/Heme/Allergies: Negative.   Psychiatric/Behavioral: Negative.     Blood pressure 128/80, pulse 91, temperature 98.5 F (36.9 C), temperature source Oral, resp. rate 20, weight 127.007 kg (280 lb), SpO2 99.00%. Physical Exam  Constitutional: She is oriented to person, place, and time. She appears well-developed.  HENT:       Anterior cervical scar secondary to anterior cervical fusion.  Eyes: Pupils are equal, round, and reactive to light.  Cardiovascular: Normal rate.   Respiratory: Effort normal.  GI: Soft.  Musculoskeletal:       Decrease of mobility of lumbar spine  Neurological: She is alert and oriented to person, place, and time.  Skin: Skin is warm.   slr positive at 45-60 degrees. Lumbar myelogram showed stenosis  at lal5 soace Assessment/Plan Decompressive laminectomies and foraminotomies at lumbar 4 and 5  Wise Fees M 02/25/2011, 9:30 AM

## 2011-02-26 MED ORDER — HYDROMORPHONE HCL 2 MG PO TABS: 2.0000 mg | ORAL_TABLET | ORAL | Status: AC | PRN

## 2011-02-26 NOTE — Discharge Summary (Signed)
Physician Discharge Summary  Patient ID: Taylor Ritter MRN: [id number] DOB/AGE: [birth year] 50 y.o.  Admit date: 02/25/2011 Discharge date: 02/26/2011  Admission Diagnoses: Lumbar spondylosis with radiculopathy  Discharge Diagnoses: Lumbar spondylosis with radiculopathy  Active Problems:  * No active hospital problems. *    Discharged Condition: good  Hospital Course: Patient was admitted to undergo surgical decompression of the lower lumbar spine as was undertaken on 12/7/ 2012. She's had good relief of her preoperative pain. Her incision is clean and dry. She is ambulating independently. He is tolerating oral pain medication. Voiding has been intact.  Consults: none  Significant Diagnostic Studies: None  Treatments: Lumbar laminectomy L4-L5.  Discharge Exam: Blood pressure 117/70, pulse 114, temperature 98.2 F (36.8 C), temperature source Oral, resp. rate 18, height 5\' 6"  (1.676 m), weight 136.1 kg (300 lb 0.7 oz), SpO2 92.00%. Incision is clean and dry. Motor function is normal in iliopsoas quadriceps tibialis anterior and gastrocs.  Disposition:   Discharge Orders    Future Orders Please Complete By Expires   Diet - low sodium heart healthy      Increase activity slowly      Discharge instructions      Comments:   No lifting greater than 10 pounds. Walk frequently and as tolerated. No repetitive bending lifting and stooping.   Driving Restrictions      Comments:   May resume driving when not requiring pain medication.   Change dressing (specify)      Comments:   Dressing change: Remove dressing to shower. May leave the dressing off if incision is clean and dry.   Call MD for:  temperature >100.4      Call MD for:  persistant nausea and vomiting      Call MD for:  redness, tenderness, or signs of infection (pain, swelling, redness, odor or green/yellow discharge around incision site)      Call MD for:  severe uncontrolled pain        Current Discharge Medication  List    START taking these medications   Details  HYDROmorphone (DILAUDID) 2 MG tablet Take 1 tablet (2 mg total) by mouth every 3 (three) hours as needed for pain. Qty: 40 tablet, Refills: 0      CONTINUE these medications which have NOT CHANGED   Details  ALPRAZolam (XANAX) 1 MG tablet Take 1 mg by mouth 3 (three) times daily as needed. For anxiety     Azelastine HCl (ASTEPRO) 0.15 % SOLN Place 1 spray into the nose 2 (two) times daily.      Bepotastine Besilate (BEPREVE OP) Place 1 drop into both eyes 2 (two) times daily.     buPROPion (WELLBUTRIN XL) 150 MG 24 hr tablet Take 150 mg by mouth 3 (three) times daily.      DULoxetine (CYMBALTA) 60 MG capsule Take 120 mg by mouth 2 (two) times daily.      esomeprazole (NEXIUM) 40 MG capsule Take 40 mg by mouth 2 (two) times daily.      fexofenadine (ALLEGRA) 180 MG tablet Take 180 mg by mouth daily.      fluticasone (VERAMYST) 27.5 MCG/SPRAY nasal spray Place 2 sprays into the nose daily.      hydrochlorothiazide (HYDRODIURIL) 25 MG tablet Take 25 mg by mouth daily.      Mometasone Furo-Formoterol Fum (DULERA) 200-5 MCG/ACT AERO Inhale 1 puff into the lungs 2 (two) times daily.      nitrofurantoin (MACRODANTIN) 100 MG capsule Take 100 mg  by mouth daily.      PRESCRIPTION MEDICATION Inject 1 vial into the muscle every 14 (fourteen) days. Allergy shot     valACYclovir (VALTREX) 500 MG tablet Take 500 mg by mouth 2 (two) times daily.           SignedStefani Dama 02/26/2011, 11:41 AM

## 2011-02-26 NOTE — Progress Notes (Signed)
CSW received referral for SNF placement. No f/u recommendations by PT noted. CSW signing off. Dellie Burns, MSW, [place] [phone]

## 2011-02-26 NOTE — Progress Notes (Signed)
Physical Therapy Evaluation Patient Details Name: LANIKA COLGATE MRN: [id number] DOB: Nov 03, 1960 Today's Date: 02/26/2011  Problem List: There is no problem list on file for this patient.    D/C from PT due to modified Independent and at or close to baseline.  No further PT needs.   Past Medical History:  Past Medical History  Diagnosis Date  . Hypertension     followed by [facility]  . Asthma     daily inhalers allegy shots.followed by [facility]  . GERD (gastroesophageal reflux disease)     on nexium  . Neuromuscular disorder     numbness arms legs,feet   . Fibromyalgia   . Arthritis     osteoporosis  . Depression    Past Surgical History:  Past Surgical History  Procedure Date  . Abdominal hysterectomy   . Cholecystectomy   . Knee arthroscopy     L KNEE  . Carpal tunnel release     R&L  . Anterior fusion cervical spine   . Lumbar laminectomy   . Esophageal dilation   . Cesarean section 1983  . Septoplasty 10+yrs ago    PT Assessment/Plan/Recommendation PT Assessment Clinical Impression Statement: pt is a 50 y/o female adm for L45 lami. and decompression.  She is already approaching baseline function  and all education is complete.  No f/u needs. PT Recommendation/Assessment: Patent does not need any further PT services No Skilled PT: All education completed;Patient at baseline level of functioning;Patient will have necessary level of assist by caregiver at discharge;Patient is modified independent with all activity/mobility PT Recommendation Follow Up Recommendations: None Equipment Recommended: None recommended by PT PT Goals     PT Evaluation Precautions/Restrictions  Precautions Precautions: Back Prior Functioning  Home Living Lives With: Spouse;Other (Comment) (grand children) Receives Help From: Family Type of Home: House Home Layout: Two level Alternate Level Stairs-Rails: Can reach both Alternate Level  Stairs-Number of Steps: 12 Home Access: Stairs to enter Entrance Stairs-Rails: Right Entrance Stairs-Number of Steps: 2 Bathroom Shower/Tub: Engineer, manufacturing systems: Standard Home Adaptive Equipment: Walker - rolling;Straight cane Prior Function Level of Independence: Independent with gait;Independent with transfers Able to Take Stairs?: Yes Driving: Yes Vocation: On disability Cognition Cognition Arousal/Alertness: Awake/alert Overall Cognitive Status: Appears within functional limits for tasks assessed Orientation Level: Oriented X4 Sensation/Coordination Coordination Gross Motor Movements are Fluid and Coordinated: Yes Fine Motor Movements are Fluid and Coordinated: Yes Extremity Assessment RUE Assessment RUE Assessment: Within Functional Limits LUE Assessment LUE Assessment: Within Functional Limits RLE Assessment RLE Assessment: Within Functional Limits LLE Assessment LLE Assessment: Within Functional Limits Mobility (including Balance) Bed Mobility Bed Mobility: Yes Rolling Left: 6: Modified independent (Device/Increase time) Right Sidelying to Sit: 6: Modified independent (Device/Increase time) Transfers Transfers: Yes Sit to Stand: 6: Modified independent (Device/Increase time) Ambulation/Gait Ambulation/Gait: Yes Ambulation/Gait Assistance: 6: Modified independent (Device/Increase time) Ambulation Distance (Feet): 300 Feet Assistive device: None Gait Pattern: Within Functional Limits Stairs: Yes Stairs Assistance: 6: Modified independent (Device/Increase time) Stair Management Technique: One rail Right;Alternating pattern Number of Stairs: 3   Posture/Postural Control Posture/Postural Control: No significant limitations Balance Balance Assessed: Yes (generally steady and safe) Exercise    End of Session PT - End of Session Activity Tolerance: Patient tolerated treatment well Patient left: in bed;with call bell in reach;with family/visitor  present General Behavior During Session: [facility] for tasks performed Cognition: [facility] for tasks performed  Samaad Hashem, Eliseo Gum 02/26/2011, 9:57 AM

## 2011-02-26 NOTE — Progress Notes (Signed)
Occupational Therapy Evaluation Patient Details Name: Taylor Ritter MRN: [id number] DOB: 10/13/1960 Today's Date: 02/26/2011 Time in: 14:07 Time out: 15:07 Eval II [place]  Problem List: There is no problem list on file for this patient.   Past Medical History:  Past Medical History  Diagnosis Date  . Hypertension     followed by [facility]  . Asthma     daily inhalers allegy shots.followed by [facility]  . GERD (gastroesophageal reflux disease)     on nexium  . Neuromuscular disorder     numbness arms legs,feet   . Fibromyalgia   . Arthritis     osteoporosis  . Depression    Past Surgical History:  Past Surgical History  Procedure Date  . Abdominal hysterectomy   . Cholecystectomy   . Knee arthroscopy     L KNEE  . Carpal tunnel release     R&L  . Anterior fusion cervical spine   . Lumbar laminectomy   . Esophageal dilation   . Cesarean section 1983  . Septoplasty 10+yrs ago    OT Assessment/Plan/Recommendation OT Assessment Clinical Impression Statement: Patient and husband educated on all back precautions and reviewed handout with them on all precautions. No further questions or concerns. Pt to discharge today. No follow up. OT Recommendation/Assessment: Patient does not need any further OT services OT Recommendation Equipment Recommended: None recommended by OT;Other (comment) (pt to borrow at tubseat or consider purchasing one herself) Husband and other family available to assist PRN at discharge. OT Goals    OT Evaluation Precautions/Restrictions  Precautions Precautions: Back Restrictions Weight Bearing Restrictions: No Prior Functioning Home Living Bathroom Toilet:  (vanity beside on left) Additional Comments: pt to check and see if she can borrow a tubseat.  Prior Function Level of Independence: Other (comment);Independent with basic ADLs (occassionallly spouse assisted with ADL PRN) ADL ADL Upper Body Bathing:  Performed;Chest;Right arm;Left arm;Abdomen;Set up;Supervision/safety Where Assessed - Upper Body Bathing: Unsupported;Sitting, bed Lower Body Bathing: Performed;Maximal assistance Lower Body Bathing Details (indicate cue type and reason): without adaptive equipment. pt unable to reach periareas without twisting and bending. Also needed assist for below knees. Demonstrated use of LHS for washing belows knees and periareas. Husband purchased AE kit. Where Assessed - Lower Body Bathing: Sit to stand from bed Upper Body Dressing: Performed;Set up;Supervision/safety Upper Body Dressing Details (indicate cue type and reason): min verbal cues for technique with back precautions. Where Assessed - Upper Body Dressing: Sitting, bed;Unsupported Lower Body Dressing: Performed;Minimal assistance Lower Body Dressing Details (indicate cue type and reason): with use of adaptive equipment including reacher, sock aid and pt had slip on shoes. Where Assessed - Lower Body Dressing: Sit to stand from bed Toilet Transfer: Supervision/safety Toilet Transfer Details (indicate cue type and reason): pt states she has a vanity next to commode and her commode is only slightly lower than this one. Feel pt will be fine with her commode at home. Toilet Transfer Method: Proofreader: Comfort height toilet;Grab bars Toileting - Clothing Manipulation: Supervision/safety Toileting - Clothing Manipulation Details (indicate cue type and reason): min verbal cues to pull up clothing as high on her legs as possible so she doesnt bend when she stands to reach for clothing. Where Assessed - Toileting Clothing Manipulation: Sit to stand from 3-in-1 or toilet Toileting - Hygiene: Supervision/safety Toileting - Hygiene Details (indicate cue type and reason): pt able to lean on commode and reach front periarea but discussed toilet aid options particularly for posterior  periarea as she cant reach bottom without breaking  precautions. Where Assessed - Toileting Hygiene: Sit on 3-in-1 or toilet Tub/Shower Transfer: Simulated;Supervision/safety Tub/Shower Transfer Details (indicate cue type and reason): simulate side step over tub hold to wall of shower Tub/Shower Transfer Equipment: Other (comment) (discussed borrowing a tubseat for safety versus purchasing) Equipment Used: Long-handled shoe horn;Long-handled sponge;Reacher;Sock aid;Other (comment) (toilet aid) ADL Comments: Educated on all AE options. Discussed borrowing a tubseat versus purchasing one as patient has numbness in feet and seat would increase her safety. Vision/Perception    Cognition Cognition Arousal/Alertness: Awake/alert Overall Cognitive Status: Appears within functional limits for tasks assessed Orientation Level: Oriented X4 Cognition - Other Comments: at times needs cues to slow down as she tends to move quickly so she doesnt break her precautions. Sensation/Coordination Sensation Light Touch: Appears Intact (for upper extremities) Extremity Assessment RUE Assessment RUE Assessment: Exceptions to [facility] LUE Assessment LUE Assessment: Exceptions to [facility] (pt states she cant reach overhead due to cervical issues) Mobility    Exercises   End of Session OT - End of Session Equipment Utilized During Treatment: Other (comment) (adaptive equipment) Activity Tolerance: Patient tolerated treatment well Patient left: in bed;with call bell in reach;with family/visitor present General Behavior During Session: [facility] for tasks performed Cognition: [facility] for tasks performed   Lennox Laity [phone] 02/26/2011, 3:43 PM

## 2011-02-28 ENCOUNTER — Encounter (HOSPITAL_COMMUNITY): Payer: Self-pay | Admitting: Neurosurgery

## 2011-03-17 ENCOUNTER — Encounter (HOSPITAL_COMMUNITY): Payer: Self-pay | Admitting: *Deleted

## 2011-03-17 ENCOUNTER — Other Ambulatory Visit: Payer: Self-pay | Admitting: Neurosurgery

## 2011-03-17 MED ORDER — CEFAZOLIN SODIUM 1-5 GM-% IV SOLN
1.0000 g | INTRAVENOUS | Status: DC
  Filled 2011-03-17: qty 50

## 2011-03-17 NOTE — Progress Notes (Signed)
Pt states after back surgery in 12/12 b/p was lower than normal and O2 sats were dropping and had to be on oxygen for the rest of the afternoon on DOS;stayed in recovery for 4hrs

## 2011-03-17 NOTE — Progress Notes (Signed)
Pt states that she had a sleep study done 65yrs ago and was using a CPAP;had a repeat sleep study done 8months ago and was told that she didn't need a CPAP that is was not indicated;she said anesthesia told her that she did need one  Sleep study done at [facility]

## 2011-03-17 NOTE — Progress Notes (Addendum)
No record found of sleep study done 8months ago

## 2011-03-18 ENCOUNTER — Encounter (HOSPITAL_COMMUNITY)
Admission: RE | Disposition: A | Discharge status: Home / Self Care | Payer: Self-pay | Source: Ambulatory Visit | Attending: Neurosurgery

## 2011-03-18 ENCOUNTER — Inpatient Hospital Stay (HOSPITAL_COMMUNITY)
Admission: RE | Admit: 2011-03-18 | Discharge: 2011-03-24 | DRG: 909 | Disposition: A | Discharge status: Home / Self Care | Payer: Medicare Other | Source: Ambulatory Visit | Attending: Neurosurgery | Admitting: Neurosurgery

## 2011-03-18 ENCOUNTER — Encounter (HOSPITAL_COMMUNITY): Payer: Self-pay

## 2011-03-18 ENCOUNTER — Encounter (HOSPITAL_COMMUNITY): Payer: Self-pay | Admitting: Anesthesiology

## 2011-03-18 ENCOUNTER — Ambulatory Visit (HOSPITAL_COMMUNITY): Payer: Medicare Other | Admitting: Anesthesiology

## 2011-03-18 ENCOUNTER — Encounter (HOSPITAL_COMMUNITY): Payer: Self-pay | Admitting: General Practice

## 2011-03-18 DIAGNOSIS — Y834 Other reconstructive surgery as the cause of abnormal reaction of the patient, or of later complication, without mention of misadventure at the time of the procedure: Secondary | ICD-10-CM | POA: Diagnosis present

## 2011-03-18 DIAGNOSIS — I10 Essential (primary) hypertension: Secondary | ICD-10-CM | POA: Diagnosis present

## 2011-03-18 DIAGNOSIS — K219 Gastro-esophageal reflux disease without esophagitis: Secondary | ICD-10-CM | POA: Diagnosis present

## 2011-03-18 DIAGNOSIS — J45909 Unspecified asthma, uncomplicated: Secondary | ICD-10-CM | POA: Diagnosis present

## 2011-03-18 DIAGNOSIS — Z981 Arthrodesis status: Secondary | ICD-10-CM

## 2011-03-18 DIAGNOSIS — Z888 Allergy status to other drugs, medicaments and biological substances status: Secondary | ICD-10-CM

## 2011-03-18 DIAGNOSIS — Y92009 Unspecified place in unspecified non-institutional (private) residence as the place of occurrence of the external cause: Secondary | ICD-10-CM

## 2011-03-18 DIAGNOSIS — IMO0001 Reserved for inherently not codable concepts without codable children: Secondary | ICD-10-CM | POA: Diagnosis present

## 2011-03-18 DIAGNOSIS — K5900 Constipation, unspecified: Secondary | ICD-10-CM | POA: Diagnosis not present

## 2011-03-18 DIAGNOSIS — Z23 Encounter for immunization: Secondary | ICD-10-CM

## 2011-03-18 DIAGNOSIS — F341 Dysthymic disorder: Secondary | ICD-10-CM | POA: Diagnosis present

## 2011-03-18 DIAGNOSIS — IMO0002 Reserved for concepts with insufficient information to code with codable children: Principal | ICD-10-CM | POA: Diagnosis present

## 2011-03-18 DIAGNOSIS — Z79899 Other long term (current) drug therapy: Secondary | ICD-10-CM

## 2011-03-18 HISTORY — DX: Sleep apnea, unspecified: G47.30

## 2011-03-18 HISTORY — PX: LUMBAR WOUND DEBRIDEMENT: SHX1988

## 2011-03-18 HISTORY — DX: Headache: R51

## 2011-03-18 HISTORY — DX: Adverse effect of unspecified anesthetic, initial encounter: T41.45XA

## 2011-03-18 HISTORY — DX: Anxiety disorder, unspecified: F41.9

## 2011-03-18 HISTORY — DX: Other complications of anesthesia, initial encounter: T88.59XA

## 2011-03-18 LAB — BASIC METABOLIC PANEL
BUN: 14 mg/dL (ref 6–23)
CO2: 28 mEq/L (ref 19–32)
Chloride: 101 mEq/L (ref 96–112)
Glucose, Bld: 105 mg/dL — ABNORMAL HIGH (ref 70–99)
Potassium: 3.9 mEq/L (ref 3.5–5.1)

## 2011-03-18 LAB — CBC
HCT: 32.7 % — ABNORMAL LOW (ref 36.0–46.0)
Hemoglobin: 10.7 g/dL — ABNORMAL LOW (ref 12.0–15.0)
MCH: 30.1 pg (ref 26.0–34.0)
MCHC: 32.7 g/dL (ref 30.0–36.0)
MCV: 92.1 fL (ref 78.0–100.0)

## 2011-03-18 SURGERY — LUMBAR WOUND DEBRIDEMENT
Anesthesia: General | Site: Back | Wound class: Contaminated

## 2011-03-18 MED ORDER — FENTANYL CITRATE 0.05 MG/ML IJ SOLN
INTRAMUSCULAR | Status: DC | PRN
  Administered 2011-03-18 (×2): 50 ug via INTRAVENOUS
  Administered 2011-03-18: 100 ug via INTRAVENOUS
  Administered 2011-03-18: 50 ug via INTRAVENOUS

## 2011-03-18 MED ORDER — BUPROPION HCL ER (XL) 150 MG PO TB24
150.0000 mg | ORAL_TABLET | Freq: Three times a day (TID) | ORAL | Status: DC
  Administered 2011-03-18 – 2011-03-24 (×17): 150 mg via ORAL
  Filled 2011-03-18 (×21): qty 1

## 2011-03-18 MED ORDER — CEFAZOLIN SODIUM-DEXTROSE 2-3 GM-% IV SOLR
INTRAVENOUS | Status: AC
  Filled 2011-03-18: qty 50

## 2011-03-18 MED ORDER — PROPOFOL 10 MG/ML IV EMUL
INTRAVENOUS | Status: DC | PRN
  Administered 2011-03-18: 200 mg via INTRAVENOUS

## 2011-03-18 MED ORDER — DIAZEPAM 5 MG PO TABS
5.0000 mg | ORAL_TABLET | Freq: Four times a day (QID) | ORAL | Status: DC | PRN
  Administered 2011-03-18 – 2011-03-22 (×10): 5 mg via ORAL
  Filled 2011-03-18 (×10): qty 1

## 2011-03-18 MED ORDER — NITROFURANTOIN MACROCRYSTAL 100 MG PO CAPS
100.0000 mg | ORAL_CAPSULE | Freq: Every day | ORAL | Status: DC
  Administered 2011-03-19 – 2011-03-24 (×6): 100 mg via ORAL
  Filled 2011-03-18 (×8): qty 1

## 2011-03-18 MED ORDER — CEFAZOLIN SODIUM-DEXTROSE 2-3 GM-% IV SOLR
2.0000 g | INTRAVENOUS | Status: AC
  Administered 2011-03-18: 2 g via INTRAVENOUS

## 2011-03-18 MED ORDER — HYDROCHLOROTHIAZIDE 25 MG PO TABS
25.0000 mg | ORAL_TABLET | Freq: Every day | ORAL | Status: DC
  Administered 2011-03-18 – 2011-03-23 (×4): 25 mg via ORAL
  Filled 2011-03-18 (×8): qty 1

## 2011-03-18 MED ORDER — SODIUM CHLORIDE 0.9 % IJ SOLN
3.0000 mL | Freq: Two times a day (BID) | INTRAMUSCULAR | Status: DC
  Administered 2011-03-18 – 2011-03-22 (×3): 3 mL via INTRAVENOUS

## 2011-03-18 MED ORDER — HYDROMORPHONE HCL PF 1 MG/ML IJ SOLN
INTRAMUSCULAR | Status: AC
  Filled 2011-03-18: qty 1

## 2011-03-18 MED ORDER — ONDANSETRON HCL 4 MG/2ML IJ SOLN
4.0000 mg | INTRAMUSCULAR | Status: DC | PRN
  Administered 2011-03-20: 4 mg via INTRAVENOUS
  Filled 2011-03-18: qty 2

## 2011-03-18 MED ORDER — NEOSTIGMINE METHYLSULFATE 1 MG/ML IJ SOLN
INTRAMUSCULAR | Status: DC | PRN
  Administered 2011-03-18: 3 mg via INTRAVENOUS

## 2011-03-18 MED ORDER — SODIUM CHLORIDE 0.9 % IV SOLN
50.0000 ug/h | Freq: Once | INTRAVENOUS | Status: DC
  Administered 2011-03-18: 50 ug/h via INTRAVENOUS

## 2011-03-18 MED ORDER — DIPHENHYDRAMINE HCL 50 MG/ML IJ SOLN
INTRAMUSCULAR | Status: AC
  Filled 2011-03-18: qty 1

## 2011-03-18 MED ORDER — LIDOCAINE HCL (CARDIAC) 20 MG/ML IV SOLN
INTRAVENOUS | Status: DC | PRN
  Administered 2011-03-18: 60 mg via INTRAVENOUS

## 2011-03-18 MED ORDER — SODIUM CHLORIDE 0.9 % IV SOLN
50.0000 ug/h | INTRAVENOUS | Status: DC
  Administered 2011-03-18: 50 ug/h via INTRAVENOUS

## 2011-03-18 MED ORDER — DULOXETINE HCL 60 MG PO CPEP
120.0000 mg | ORAL_CAPSULE | Freq: Two times a day (BID) | ORAL | Status: DC
  Administered 2011-03-19 – 2011-03-24 (×11): 120 mg via ORAL
  Filled 2011-03-18 (×15): qty 2

## 2011-03-18 MED ORDER — GLYCOPYRROLATE 0.2 MG/ML IJ SOLN
INTRAMUSCULAR | Status: DC | PRN
  Administered 2011-03-18: .4 mg via INTRAVENOUS

## 2011-03-18 MED ORDER — SODIUM CHLORIDE 0.9 % IV SOLN: 250.0000 mL | INTRAVENOUS | Status: DC

## 2011-03-18 MED ORDER — SODIUM CHLORIDE 0.9 % IJ SOLN: 3.0000 mL | INTRAMUSCULAR | Status: DC | PRN

## 2011-03-18 MED ORDER — PANTOPRAZOLE SODIUM 40 MG PO TBEC
40.0000 mg | DELAYED_RELEASE_TABLET | Freq: Every day | ORAL | Status: DC
  Administered 2011-03-18 – 2011-03-24 (×7): 40 mg via ORAL
  Filled 2011-03-18 (×7): qty 1

## 2011-03-18 MED ORDER — ROCURONIUM BROMIDE 100 MG/10ML IV SOLN
INTRAVENOUS | Status: DC | PRN
  Administered 2011-03-18: 50 mg via INTRAVENOUS

## 2011-03-18 MED ORDER — VALACYCLOVIR HCL 500 MG PO TABS
500.0000 mg | ORAL_TABLET | Freq: Two times a day (BID) | ORAL | Status: DC
Start: 2011-03-18 — End: 2011-03-24
  Administered 2011-03-19 – 2011-03-24 (×11): 500 mg via ORAL
  Filled 2011-03-18 (×14): qty 1

## 2011-03-18 MED ORDER — LACTATED RINGERS IV SOLN
INTRAVENOUS | Status: DC | PRN
  Administered 2011-03-18: 10:00:00 via INTRAVENOUS

## 2011-03-18 MED ORDER — THROMBIN 5000 UNITS EX KIT
PACK | CUTANEOUS | Status: DC | PRN
  Administered 2011-03-18 (×2): 5000 [IU] via TOPICAL

## 2011-03-18 MED ORDER — INFLUENZA VIRUS VACC SPLIT PF IM SUSP
0.5000 mL | INTRAMUSCULAR | Status: AC
  Administered 2011-03-19: 0.5 mL via INTRAMUSCULAR
  Filled 2011-03-18: qty 0.5

## 2011-03-18 MED ORDER — MEPERIDINE HCL 25 MG/ML IJ SOLN: 6.2500 mg | INTRAMUSCULAR | Status: DC | PRN

## 2011-03-18 MED ORDER — DIPHENHYDRAMINE HCL 50 MG PO CAPS
50.0000 mg | ORAL_CAPSULE | Freq: Four times a day (QID) | ORAL | Status: DC | PRN
  Administered 2011-03-18 – 2011-03-21 (×4): 50 mg via ORAL
  Filled 2011-03-18 (×7): qty 1

## 2011-03-18 MED ORDER — ZOLPIDEM TARTRATE 10 MG PO TABS
10.0000 mg | ORAL_TABLET | Freq: Every evening | ORAL | Status: DC | PRN
  Administered 2011-03-19 – 2011-03-23 (×5): 10 mg via ORAL
  Filled 2011-03-18 (×5): qty 1

## 2011-03-18 MED ORDER — MIDAZOLAM HCL 5 MG/5ML IJ SOLN
INTRAMUSCULAR | Status: DC | PRN
  Administered 2011-03-18 (×2): 1 mg via INTRAVENOUS

## 2011-03-18 MED ORDER — ACETAMINOPHEN 650 MG RE SUPP: 650.0000 mg | RECTAL | Status: DC | PRN

## 2011-03-18 MED ORDER — HYDROMORPHONE HCL PF 1 MG/ML IJ SOLN
0.2500 mg | INTRAMUSCULAR | Status: DC | PRN
  Administered 2011-03-18 (×4): 0.5 mg via INTRAVENOUS

## 2011-03-18 MED ORDER — SODIUM CHLORIDE 0.9 % IV SOLN
INTRAVENOUS | Status: DC
  Administered 2011-03-18 – 2011-03-19 (×2): via INTRAVENOUS
  Administered 2011-03-19: 75 mL via INTRAVENOUS
  Administered 2011-03-21: 10:00:00 via INTRAVENOUS

## 2011-03-18 MED ORDER — HEMOSTATIC AGENTS (NO CHARGE) OPTIME
TOPICAL | Status: DC | PRN
  Administered 2011-03-18: 1 via TOPICAL

## 2011-03-18 MED ORDER — DIPHENHYDRAMINE HCL 50 MG/ML IJ SOLN
25.0000 mg | Freq: Once | INTRAMUSCULAR | Status: AC
  Administered 2011-03-18: 25 mg via INTRAVENOUS

## 2011-03-18 MED ORDER — HYDROMORPHONE HCL 2 MG PO TABS
2.0000 mg | ORAL_TABLET | ORAL | Status: DC | PRN
  Administered 2011-03-18 – 2011-03-19 (×4): 2 mg via ORAL
  Filled 2011-03-18 (×4): qty 1

## 2011-03-18 MED ORDER — ALPRAZOLAM 0.5 MG PO TABS
1.0000 mg | ORAL_TABLET | Freq: Three times a day (TID) | ORAL | Status: DC | PRN
  Administered 2011-03-19 – 2011-03-23 (×7): 1 mg via ORAL
  Filled 2011-03-18 (×2): qty 2
  Filled 2011-03-18: qty 1
  Filled 2011-03-18: qty 2
  Filled 2011-03-18: qty 1
  Filled 2011-03-18 (×3): qty 2

## 2011-03-18 MED ORDER — HYDROMORPHONE HCL PF 1 MG/ML IJ SOLN
INTRAMUSCULAR | Status: AC
  Administered 2011-03-18: 1 mg via INTRAVENOUS
  Filled 2011-03-18: qty 1

## 2011-03-18 MED ORDER — MENTHOL 3 MG MT LOZG
1.0000 | LOZENGE | OROMUCOSAL | Status: DC | PRN
  Administered 2011-03-21: 3 mg via ORAL
  Filled 2011-03-18 (×5): qty 9

## 2011-03-18 MED ORDER — ACETAMINOPHEN 325 MG PO TABS: 650.0000 mg | ORAL_TABLET | ORAL | Status: DC | PRN

## 2011-03-18 MED ORDER — LORATADINE 10 MG PO TABS
10.0000 mg | ORAL_TABLET | Freq: Every day | ORAL | Status: DC
  Administered 2011-03-19 – 2011-03-24 (×6): 10 mg via ORAL
  Filled 2011-03-18 (×8): qty 1

## 2011-03-18 MED ORDER — ONDANSETRON HCL 4 MG/2ML IJ SOLN
INTRAMUSCULAR | Status: DC | PRN
  Administered 2011-03-18: 4 mg via INTRAVENOUS

## 2011-03-18 MED ORDER — FENTANYL CITRATE 0.05 MG/ML IJ SOLN
25.0000 ug | INTRAMUSCULAR | Status: DC | PRN
  Administered 2011-03-18: 50 ug via INTRAVENOUS

## 2011-03-18 MED ORDER — OXYCODONE-ACETAMINOPHEN 5-325 MG PO TABS: 1.0000 | ORAL_TABLET | ORAL | Status: DC | PRN

## 2011-03-18 MED ORDER — PHENOL 1.4 % MT LIQD
1.0000 | OROMUCOSAL | Status: DC | PRN
  Administered 2011-03-18: 1 via OROMUCOSAL
  Filled 2011-03-18: qty 177

## 2011-03-18 MED ORDER — CEFAZOLIN SODIUM 1-5 GM-% IV SOLN
1.0000 g | Freq: Three times a day (TID) | INTRAVENOUS | Status: AC
  Administered 2011-03-18 (×2): 1 g via INTRAVENOUS
  Filled 2011-03-18 (×2): qty 50

## 2011-03-18 MED ORDER — FENTANYL CITRATE 0.05 MG/ML IJ SOLN
INTRAMUSCULAR | Status: AC
  Administered 2011-03-18: 50 ug
  Filled 2011-03-18: qty 2

## 2011-03-18 MED ORDER — PROMETHAZINE HCL 25 MG/ML IJ SOLN: 6.2500 mg | INTRAMUSCULAR | Status: DC | PRN

## 2011-03-18 MED ORDER — DIAZEPAM 5 MG PO TABS
ORAL_TABLET | ORAL | Status: AC
  Administered 2011-03-19: 5 mg via ORAL
  Filled 2011-03-18: qty 1

## 2011-03-18 MED ORDER — HYDROMORPHONE HCL PF 1 MG/ML IJ SOLN
1.0000 mg | INTRAMUSCULAR | Status: DC | PRN
  Administered 2011-03-18 – 2011-03-21 (×7): 1 mg via INTRAVENOUS
  Filled 2011-03-18 (×7): qty 1

## 2011-03-18 MED ORDER — ALPRAZOLAM 1 MG PO TABS: 1.0000 mg | ORAL_TABLET | Freq: Three times a day (TID) | ORAL | Status: DC | PRN

## 2011-03-18 SURGICAL SUPPLY — 64 items
BENZOIN TINCTURE PRP APPL 2/3 (GAUZE/BANDAGES/DRESSINGS) IMPLANT
BLADE SURG ROTATE 9660 (MISCELLANEOUS) IMPLANT
BUR MATCHSTICK NEURO 3.0X3.8 (BURR) ×2 IMPLANT
CANISTER SUCTION 2500CC (MISCELLANEOUS) ×2 IMPLANT
CLOTH BEACON ORANGE TIMEOUT ST (SAFETY) ×2 IMPLANT
DRAPE LAPAROTOMY 100X72X124 (DRAPES) ×2 IMPLANT
DRAPE MICROSCOPE LEICA (MISCELLANEOUS) ×2 IMPLANT
DRAPE POUCH INSTRU U-SHP 10X18 (DRAPES) ×2 IMPLANT
DRSG PAD ABDOMINAL 8X10 ST (GAUZE/BANDAGES/DRESSINGS) ×2 IMPLANT
DURAPREP 26ML APPLICATOR (WOUND CARE) ×2 IMPLANT
ELECT REM PT RETURN 9FT ADLT (ELECTROSURGICAL) ×2
ELECTRODE REM PT RTRN 9FT ADLT (ELECTROSURGICAL) ×1 IMPLANT
GAUZE SPONGE 4X4 16PLY XRAY LF (GAUZE/BANDAGES/DRESSINGS) IMPLANT
GLOVE BIO SURGEON STRL SZ 6.5 (GLOVE) IMPLANT
GLOVE BIO SURGEON STRL SZ7 (GLOVE) IMPLANT
GLOVE BIO SURGEON STRL SZ7.5 (GLOVE) IMPLANT
GLOVE BIO SURGEON STRL SZ8 (GLOVE) IMPLANT
GLOVE BIO SURGEON STRL SZ8.5 (GLOVE) IMPLANT
GLOVE BIOGEL M 8.0 STRL (GLOVE) ×2 IMPLANT
GLOVE BIOGEL PI IND STRL 8 (GLOVE) ×1 IMPLANT
GLOVE BIOGEL PI INDICATOR 8 (GLOVE) ×1
GLOVE ECLIPSE 6.5 STRL STRAW (GLOVE) IMPLANT
GLOVE ECLIPSE 7.0 STRL STRAW (GLOVE) IMPLANT
GLOVE ECLIPSE 7.5 STRL STRAW (GLOVE) ×6 IMPLANT
GLOVE ECLIPSE 8.0 STRL XLNG CF (GLOVE) IMPLANT
GLOVE ECLIPSE 8.5 STRL (GLOVE) IMPLANT
GLOVE EXAM NITRILE LRG STRL (GLOVE) IMPLANT
GLOVE EXAM NITRILE MD LF STRL (GLOVE) IMPLANT
GLOVE EXAM NITRILE XL STR (GLOVE) IMPLANT
GLOVE EXAM NITRILE XS STR PU (GLOVE) IMPLANT
GLOVE INDICATOR 6.5 STRL GRN (GLOVE) IMPLANT
GLOVE INDICATOR 7.0 STRL GRN (GLOVE) IMPLANT
GLOVE INDICATOR 7.5 STRL GRN (GLOVE) IMPLANT
GLOVE INDICATOR 8.0 STRL GRN (GLOVE) IMPLANT
GLOVE INDICATOR 8.5 STRL (GLOVE) IMPLANT
GLOVE OPTIFIT SS 8.0 STRL (GLOVE) IMPLANT
GLOVE SURG SS PI 6.5 STRL IVOR (GLOVE) IMPLANT
GOWN BRE IMP SLV AUR LG STRL (GOWN DISPOSABLE) IMPLANT
GOWN BRE IMP SLV AUR XL STRL (GOWN DISPOSABLE) ×4 IMPLANT
GOWN STRL REIN 2XL LVL4 (GOWN DISPOSABLE) ×2 IMPLANT
KIT BASIN OR (CUSTOM PROCEDURE TRAY) ×2 IMPLANT
KIT ROOM TURNOVER OR (KITS) ×2 IMPLANT
NS IRRIG 1000ML POUR BTL (IV SOLUTION) ×2 IMPLANT
PACK LAMINECTOMY NEURO (CUSTOM PROCEDURE TRAY) ×2 IMPLANT
PAD ARMBOARD 7.5X6 YLW CONV (MISCELLANEOUS) ×6 IMPLANT
RUBBERBAND STERILE (MISCELLANEOUS) ×4 IMPLANT
SPONGE GAUZE 4X4 12PLY (GAUZE/BANDAGES/DRESSINGS) ×2 IMPLANT
SPONGE SURGIFOAM ABS GEL SZ50 (HEMOSTASIS) ×2 IMPLANT
STAPLER VISISTAT 35W (STAPLE) ×2 IMPLANT
STRIP CLOSURE SKIN 1/2X4 (GAUZE/BANDAGES/DRESSINGS) IMPLANT
SUT CHROMIC 2 0 SH (SUTURE) ×2 IMPLANT
SUT CHROMIC 3 0 SH 27 (SUTURE) ×2 IMPLANT
SUT PROLENE 0 CT 1 30 (SUTURE) ×16 IMPLANT
SUT VIC AB 0 CT1 18XCR BRD8 (SUTURE) ×1 IMPLANT
SUT VIC AB 0 CT1 8-18 (SUTURE) ×1
SUT VIC AB 2-0 CP2 18 (SUTURE) IMPLANT
SUT VIC AB 3-0 SH 8-18 (SUTURE) IMPLANT
SWAB CULTURE LIQ STUART DBL (MISCELLANEOUS) ×2 IMPLANT
SYR 20ML ECCENTRIC (SYRINGE) ×2 IMPLANT
TAPE CLOTH SURG 4X10 WHT LF (GAUZE/BANDAGES/DRESSINGS) ×2 IMPLANT
TOWEL OR 17X24 6PK STRL BLUE (TOWEL DISPOSABLE) ×2 IMPLANT
TOWEL OR 17X26 10 PK STRL BLUE (TOWEL DISPOSABLE) ×2 IMPLANT
TUBE ANAEROBIC SPECIMEN COL (MISCELLANEOUS) ×2 IMPLANT
WATER STERILE IRR 1000ML POUR (IV SOLUTION) ×2 IMPLANT

## 2011-03-18 NOTE — Anesthesia Preprocedure Evaluation (Addendum)
Anesthesia Evaluation  Patient identified by MRN, date of birth, ID band Patient awake    Reviewed: Allergy & Precautions, H&P , NPO status , Patient's Chart, lab work & pertinent test results  Airway Mallampati: I      Dental No notable dental hx. (+) Teeth Intact   Pulmonary neg pulmonary ROS, asthma ,  clear to auscultation  Pulmonary exam normal       Cardiovascular hypertension, On Medications regular Normal    Neuro/Psych  Headaches, PSYCHIATRIC DISORDERS  Neuromuscular disease    GI/Hepatic Neg liver ROS, GERD-  Medicated,  Endo/Other  Negative Endocrine ROSMorbid obesity  Renal/GU negative Renal ROS Bladder dysfunction   Genitourinary negative   Musculoskeletal   Abdominal   Peds  Hematology negative hematology ROS (+)   Anesthesia Other Findings   Reproductive/Obstetrics negative OB ROS                         Anesthesia Physical Anesthesia Plan  ASA: III  Anesthesia Plan: General   Post-op Pain Management:    Induction: Intravenous  Airway Management Planned: Oral ETT  Additional Equipment:   Intra-op Plan:   Post-operative Plan: Extubation in OR  Informed Consent: I have reviewed the patients History and Physical, chart, labs and discussed the procedure including the risks, benefits and alternatives for the proposed anesthesia with the patient or authorized representative who has indicated his/her understanding and acceptance.   Dental advisory given  Plan Discussed with: CRNA and Surgeon  Anesthesia Plan Comments:        Anesthesia Quick Evaluation

## 2011-03-18 NOTE — Progress Notes (Signed)
Pt. Received Fentanyl 50 mcg IV per Dr. Myrtis Hopping orders/

## 2011-03-18 NOTE — Progress Notes (Signed)
Exploration of lumbar wound done.NO EVIDENCE OF CSF LEAK. Or dicataion number [id number]

## 2011-03-18 NOTE — Anesthesia Postprocedure Evaluation (Signed)
  Anesthesia Post-op Note  Patient: Taylor Ritter  Procedure(s) Performed:  LUMBAR WOUND DEBRIDEMENT - Exploration of Lumbar wound  Patient Location: PACU  Anesthesia Type: General  Level of Consciousness: awake and alert   Airway and Oxygen Therapy: Patient Spontanous Breathing and Patient connected to nasal cannula oxygen  Post-op Pain: moderate  Post-op Assessment: Post-op Vital signs reviewed, Patient's Cardiovascular Status Stable, Respiratory Function Stable, Patent Airway and No signs of Nausea or vomiting  Post-op Vital Signs: Reviewed and stable  Complications: No apparent anesthesia complications

## 2011-03-18 NOTE — Progress Notes (Signed)
Patient was received from PACU with re-exploration of her lumbar surgery she had on the 02/25/11, on arrival patient waas stable with no deficit , neurowise she was intact, dressing looked dry and intact. Patient oriented to the unit with call light available to her. Will continue to carry out the care needed for her.

## 2011-03-18 NOTE — H&P (Signed)
Taylor Ritter is an 50 y.o. female.   Chief Complaint:draining from wound. HPI: patient underwent lumbar laminectomies few days ago. Started to have some brown drainage about 5days not associated with headache. i saw her in my office,culture was taken. Called because of increase of drainage but now is more clear. To be admitted for exploration of wound.  Past Medical History  Diagnosis Date  . Hypertension     followed by [facility]  . Asthma     daily inhalers allegy shots.followed by [facility]  . GERD (gastroesophageal reflux disease)     on nexium  . Neuromuscular disorder     numbness arms legs,feet   . Fibromyalgia   . Arthritis     osteoporosis  . Depression   . Anxiety     uses xanax as needed   . Complication of anesthesia     low BP in PACU in last postop phase, low O2 level in PACU, ptneck mobility restricted due to previous cerv. fusion, per pt.   . Headache     Past Surgical History  Procedure Date  . Abdominal hysterectomy   . Cholecystectomy   . Knee arthroscopy     L KNEE  . Carpal tunnel release     R&L  . Anterior fusion cervical spine   . Lumbar laminectomy   . Esophageal dilation   . Cesarean section 1983  . Septoplasty 10+yrs ago  . Lumbar laminectomy/decompression microdiscectomy 02/25/2011    Procedure: LUMBAR LAMINECTOMY/DECOMPRESSION MICRODISCECTOMY;  Surgeon: Karn Cassis;  Location: [facility];  Service: Neurosurgery;  Laterality: N/A;  Lumbar Four-Five Microdiscectomy  . Back surgery 2012    Family History  Problem Relation Age of Onset  . Anesthesia problems Neg Hx   . Hypotension Neg Hx   . Malignant hyperthermia Neg Hx   . Pseudochol deficiency Neg Hx    Social History:  reports that she has never smoked. She does not have any smokeless tobacco history on file. She reports that she does not drink alcohol or use illicit drugs.  Allergies:  Allergies  Allergen Reactions  . Codeine Hives  .  Morphine And Related Hives and Itching  . Other Other (See Comments)    Nylon sutures had to remove to allow healing  Vicryl sutures  . Vesicare (Solifenacin Succinate) Itching    Medications Prior to Admission  Medication Dose Route Frequency Provider Last Rate Last Dose  . ceFAZolin (ANCEF) 2-3 GM-% IVPB SOLR           . ceFAZolin (ANCEF) IVPB 2 g/50 mL premix  2 g Intravenous 60 min Pre-Op Tanya Nones Simeon Vera      . DISCONTD: ceFAZolin (ANCEF) IVPB 1 g/50 mL premix  1 g Intravenous 60 min Pre-Op Tanya Nones Shanese Riemenschneider       Medications Prior to Admission  Medication Sig Dispense Refill  . ALPRAZolam (XANAX) 1 MG tablet Take 1 mg by mouth 3 (three) times daily as needed. For anxiety       . Azelastine HCl (ASTEPRO) 0.15 % SOLN Place 1 spray into the nose 2 (two) times daily.        . Bepotastine Besilate (BEPREVE OP) Place 1 drop into both eyes 2 (two) times daily.       Marland Kitchen buPROPion (WELLBUTRIN XL) 150 MG 24 hr tablet Take 150 mg by mouth 3 (three) times daily.        . diphenhydrAMINE (BENADRYL) 50 MG capsule Take 50 mg  by mouth every 6 (six) hours as needed.        . DULoxetine (CYMBALTA) 60 MG capsule Take 120 mg by mouth 2 (two) times daily.        Marland Kitchen esomeprazole (NEXIUM) 40 MG capsule Take 40 mg by mouth 2 (two) times daily.        . fexofenadine (ALLEGRA) 180 MG tablet Take 180 mg by mouth daily.        . fluticasone (VERAMYST) 27.5 MCG/SPRAY nasal spray Place 2 sprays into the nose daily.        . hydrochlorothiazide (HYDRODIURIL) 25 MG tablet Take 25 mg by mouth daily.        . Mometasone Furo-Formoterol Fum (DULERA) 200-5 MCG/ACT AERO Inhale 1 puff into the lungs 2 (two) times daily.        . nitrofurantoin (MACRODANTIN) 100 MG capsule Take 100 mg by mouth daily.        Marland Kitchen PRESCRIPTION MEDICATION Inject 1 vial into the muscle every 14 (fourteen) days. Allergy shot       . valACYclovir (VALTREX) 500 MG tablet Take 500 mg by mouth 2 (two) times daily.          Results for orders placed  during the hospital encounter of 03/18/11 (from the past 48 hour(s))  BASIC METABOLIC PANEL     Status: Abnormal   Collection Time   03/18/11  8:10 AM      Component Value Range Comment   Sodium 139  135 - 145 (mEq/L)    Potassium 3.9  3.5 - 5.1 (mEq/L)    Chloride 101  96 - 112 (mEq/L)    CO2 28  19 - 32 (mEq/L)    Glucose, Bld 105 (*) 70 - 99 (mg/dL)    BUN 14  6 - 23 (mg/dL)    Creatinine, Ser 1.61  0.50 - 1.10 (mg/dL)    Calcium 9.0  8.4 - 10.5 (mg/dL)    GFR calc non Af Amer 58 (*) >90 (mL/min)    GFR calc Af Amer 68 (*) >90 (mL/min)   CBC     Status: Abnormal   Collection Time   03/18/11  8:10 AM      Component Value Range Comment   WBC 7.2  4.0 - 10.5 (K/uL)    RBC 3.55 (*) 3.87 - 5.11 (MIL/uL)    Hemoglobin 10.7 (*) 12.0 - 15.0 (g/dL)    HCT 09.6 (*) 04.5 - 46.0 (%)    MCV 92.1  78.0 - 100.0 (fL)    MCH 30.1  26.0 - 34.0 (pg)    MCHC 32.7  30.0 - 36.0 (g/dL)    RDW 40.9  81.1 - 91.4 (%)    Platelets 260  150 - 400 (K/uL)    No results found.  Review of Systems  Constitutional: Negative.   HENT: Negative.   Eyes: Negative.   Respiratory: Negative.   Cardiovascular: Negative.   Gastrointestinal: Negative.   Genitourinary: Negative.   Musculoskeletal: Positive for back pain.  Skin: Negative.   Neurological: Negative.   Endo/Heme/Allergies: Negative.   Psychiatric/Behavioral: Negative.     Blood pressure 138/81, pulse 86, temperature 98.3 F (36.8 C), temperature source Oral, resp. rate 20, height 5\' 6"  (1.676 m), weight 124.739 kg (275 lb), SpO2 99.00%. Physical Exam heent.nl. Neck, anterior scar. Lungs clear. cv , nl. Abdomen ,soft. Extremities nl. NEURO NORMAL. There is drainage from lumbar wound, clear color.  Assessment/Plan  exploration of lumbar wound to r/o csf leak.  Kimberly Coye M 03/18/2011, 10:25 AM

## 2011-03-18 NOTE — Anesthesia Procedure Notes (Signed)
Procedure Name: Intubation Date/Time: 03/18/2011 10:59 AM Performed by: Caryn Bee Pre-anesthesia Checklist: Patient identified, Emergency Drugs available, Suction available, Patient being monitored and Timeout performed Patient Re-evaluated:Patient Re-evaluated prior to inductionOxygen Delivery Method: Circle System Utilized Preoxygenation: Pre-oxygenation with 100% oxygen Intubation Type: IV induction Ventilation: Oral airway inserted - appropriate to patient size and Mask ventilation without difficulty Laryngoscope Size: Mac and 4 Grade View: Grade II Tube type: Oral Tube size: 7.5 mm Number of attempts: 1 Airway Equipment and Method: stylet Placement Confirmation: ETT inserted through vocal cords under direct vision,  positive ETCO2 and breath sounds checked- equal and bilateral Secured at: 22 cm Tube secured with: Tape Dental Injury: Teeth and Oropharynx as per pre-operative assessment

## 2011-03-18 NOTE — Transfer of Care (Signed)
Immediate Anesthesia Transfer of Care Note  Patient: Taylor Ritter  Procedure(s) Performed:  LUMBAR WOUND DEBRIDEMENT - Exploration of Lumbar wound  Patient Location: PACU  Anesthesia Type: General  Level of Consciousness: awake and alert   Airway & Oxygen Therapy: Patient Spontanous Breathing and Patient connected to face mask oxygen  Post-op Assessment: Report given to PACU RN and Post -op Vital signs reviewed and stable  Post vital signs: Reviewed and stable  Complications: No apparent anesthesia complications

## 2011-03-18 NOTE — Preoperative (Signed)
Beta Blockers   Reason not to administer Beta Blockers:Not Applicable 

## 2011-03-19 MED ORDER — DEXAMETHASONE 4 MG PO TABS
4.0000 mg | ORAL_TABLET | Freq: Four times a day (QID) | ORAL | Status: AC
  Administered 2011-03-19 – 2011-03-20 (×4): 4 mg via ORAL
  Filled 2011-03-19 (×4): qty 1

## 2011-03-19 MED ORDER — HYDROMORPHONE HCL 2 MG PO TABS
4.0000 mg | ORAL_TABLET | ORAL | Status: DC | PRN
  Administered 2011-03-19 – 2011-03-23 (×14): 4 mg via ORAL
  Administered 2011-03-23 (×2): 2 mg via ORAL
  Administered 2011-03-23 – 2011-03-24 (×2): 4 mg via ORAL
  Filled 2011-03-19: qty 2
  Filled 2011-03-19 (×2): qty 1
  Filled 2011-03-19 (×7): qty 2
  Filled 2011-03-19: qty 1
  Filled 2011-03-19 (×4): qty 2
  Filled 2011-03-19: qty 1
  Filled 2011-03-19 (×3): qty 2

## 2011-03-19 NOTE — Op Note (Signed)
Taylor Ritter, Taylor Ritter NO.:  [id number]  MEDICAL RECORD NO.:  [id number]  LOCATION:  3013                         FACILITY:  [facility]  PHYSICIAN:  Hilda Lias, M.D.   DATE OF BIRTH:  1961-01-28  DATE OF PROCEDURE: DATE OF DISCHARGE:                              OPERATIVE REPORT   PREOPERATIVE DIAGNOSES:  Rule out lumbar cerebrospinal fluid leak. Status post lumbar laminectomy.  POSTOPERATIVE DIAGNOSES:  Drainage of epidural seroma.  PROCEDURE:  Exploration of the lumbar wound, drainage of epidural seroma.  Microscope.  SURGEON:  Hilda Lias, M.D.  ASSIST:  Stefani Dama, MD  CLINICAL HISTORY:  Mrs. Torain is a lady who underwent lumbar laminectomy several weeks ago.  The patient did well but she came to my office complaining of some drainage from the wound.  The patient has no fever.  No headache.  I saw her in my office.  I did some culture.  She had been on p.o. antibiotics.  Nevertheless, she called me yesterday telling me that she has increase of the drainage.  Today, prior to surgery, she told me that she has a history of allergy to VICRYL. Decision to explore the wound just to rule out CSF leak was made.  The patient knew the risk with the surgery including possibility of no findings via CSF leak of possibility of having to proceed with a lumbar drainage.  PROCEDURE:  The patient was taken to the OR and after intubation, she was positioned in a prone manner.  The lumbar area was cleaned with DuraPrep.  Drapes were applied.  Removal of the previous __________ was made and immediately we found that the patient had quite a bit of brownish type of fluid. The incision was carried out all the way down to the epidural space.  We retracted the area.  We brought the microscope immediately to the area.  We found quite a bit of fibrosis but no evidence of any CSF leak.  We started exploring above the L4 and then below the L5-S1.  We also explored the L4,  L5-S1 nerve roots.  We found most of the fibrosis __________ CSF leak.  The thecal sac was really tense which __________ with no evidence of any CSF leak.  Nevertheless, we did Valsalva twice  __________ without any evidence of any fluid coming from the thecal sac.  The main wound area was irrigated.  Because she was allergic to VICRYL, we decided to go ahead with Prolene, __________.  We will check the culture which was taken in the office several days ago, __________ difficult to be arousing.  We are going to keep her for the next 24 to 48 just to ensure that she is back to her baseline.         ______________________________ Hilda Lias, M.D.    EB/MEDQ  D:  03/18/2011  T:  03/19/2011  Job:  [id number]

## 2011-03-19 NOTE — Progress Notes (Signed)
Patient ID: Taylor Ritter, female   DOB: January 16, 1961, 50 y.o.   MRN: [id number] Subjective: Patient reports Right leg pain  Objective: Vital signs in last 24 hours: Temp:  [97.3 F (36.3 C)-99.2 F (37.3 C)] 97.7 F (36.5 C) (12/29 0938) Pulse Rate:  [95-109] 99  (12/29 0938) Resp:  [9-31] 20  (12/29 0938) BP: (86-130)/(51-80) 94/60 mmHg (12/29 0938) SpO2:  [90 %-100 %] 94 % (12/29 0938)  Intake/Output from previous day: 12/28 0701 - 12/29 0700 In: 3345 [P.O.:1270; I.V.:2025; IV Piggyback:50] Out: 1100 [Urine:1050; Blood:50] Intake/Output this shift: Total I/O In: 480 [P.O.:480] Out: -   Wound:Looks excellent  Lab Results:  Basename 03/18/11 0810  WBC 7.2  HGB 10.7*  HCT 32.7*  PLT 260   BMET  Basename 03/18/11 0810  NA 139  K 3.9  CL 101  CO2 28  GLUCOSE 105*  BUN 14  CREATININE 1.08  CALCIUM 9.0    Studies/Results: No results found.  Assessment/Plan: Doing well.Will use decadron for inflamation  LOS: 1 day  As above   Reinaldo Meeker, MD 03/19/2011, 10:02 AM

## 2011-03-19 NOTE — Progress Notes (Signed)
Occupational Therapy Evaluation Patient Details Name: Taylor Ritter MRN: [id number] DOB: 12/27/1960 Today's Date: 03/19/2011  Problem List: There is no problem list on file for this patient.   Past Medical History:  Past Medical History  Diagnosis Date  . Hypertension     followed by [facility]  . Asthma     daily inhalers allegy shots.followed by [facility]  . GERD (gastroesophageal reflux disease)     on nexium  . Neuromuscular disorder     numbness arms legs,feet   . Fibromyalgia   . Arthritis     osteoporosis  . Depression   . Anxiety     uses xanax as needed   . Headache   . Shortness of breath   . Complication of anesthesia   . Sleep apnea   . Chronic kidney disease     see's kidney doctor for urine retention   Past Surgical History:  Past Surgical History  Procedure Date  . Abdominal hysterectomy   . Cholecystectomy   . Knee arthroscopy     L KNEE  . Carpal tunnel release     R&L  . Anterior fusion cervical spine   . Lumbar laminectomy   . Esophageal dilation   . Cesarean section 1983  . Septoplasty 10+yrs ago  . Lumbar laminectomy/decompression microdiscectomy 02/25/2011    Procedure: LUMBAR LAMINECTOMY/DECOMPRESSION MICRODISCECTOMY;  Surgeon: Karn Cassis;  Location: [facility];  Service: Neurosurgery;  Laterality: N/A;  Lumbar Four-Five Microdiscectomy  . Back surgery 2012    OT Assessment/Plan/Recommendation OT Assessment Clinical Impression Statement: Pt. will benefit from skilled OT to increase functional independence and safely complet ADLs.  OT Recommendation/Assessment: Patient will need skilled OT in the acute care venue OT Problem List: Decreased activity tolerance;Decreased knowledge of use of DME or AE;Decreased knowledge of precautions;Decreased safety awareness;Pain Barriers to Discharge: None OT Therapy Diagnosis : Acute pain OT Plan OT Frequency: Min 2X/week OT Treatment/Interventions:  Self-care/ADL training;DME and/or AE instruction;Patient/family education;Balance training OT Recommendation Follow Up Recommendations: None Equipment Recommended: None recommended by OT;Other (comment) Individuals Consulted Consulted and Agree with Results and Recommendations: Patient OT Goals Acute Rehab OT Goals OT Goal Formulation: With patient Time For Goal Achievement: 7 days ADL Goals Pt Will Perform Grooming: with modified independence;Standing at sink ADL Goal: Grooming - Progress: Progressing toward goals Pt Will Perform Lower Body Bathing: with modified independence;Sit to stand from bed (with AE) ADL Goal: Lower Body Bathing - Progress: Not met Pt Will Perform Lower Body Dressing: with modified independence;Sit to stand from bed;with adaptive equipment ADL Goal: Lower Body Dressing - Progress: Not met Pt Will Transfer to Toilet: with modified independence;3-in-1;with DME ADL Goal: Toilet Transfer - Progress: Progressing toward goals Pt Will Perform Tub/Shower Transfer: with set-up;with supervision;with DME;Ambulation;Tub transfer;Shower seat with back ADL Goal: Web designer - Progress: Not addressed  OT Evaluation Precautions/Restrictions  Precautions Precautions: Back Precaution Booklet Issued: No Required Braces or Orthoses: No Restrictions Weight Bearing Restrictions: No Prior Functioning Home Living Lives With: Spouse;Other (Comment) Receives Help From: Family Type of Home: House Home Layout: Two level Alternate Level Stairs-Rails: Can reach both Alternate Level Stairs-Number of Steps: 12 Home Access: Stairs to enter Entrance Stairs-Rails: Right Entrance Stairs-Number of Steps: 2 Bathroom Shower/Tub: Engineer, manufacturing systems: Standard Bathroom Accessibility: Yes How Accessible: Accessible via walker Home Adaptive Equipment: Walker - rolling;Straight cane;Other (comment) (tub seat) Prior Function Level of Independence: Other  (comment);Independent with basic ADLs Able to Take Stairs?: Yes Driving: Yes Vocation:  On disability Comments: Pt. with recent admit and back surgery on 02/26/11 ADL ADL Eating/Feeding: Simulated;Set up Where Assessed - Eating/Feeding: Chair Grooming: Performed;Wash/dry face;Set up Where Assessed - Grooming: Sitting, bed Upper Body Bathing: Simulated;Chest;Right arm;Left arm;Abdomen;Minimal assistance Upper Body Bathing Details (indicate cue type and reason): To prevent twisting Where Assessed - Upper Body Bathing: Unsupported;Sitting, bed Lower Body Bathing: Simulated;Maximal assistance Lower Body Bathing Details (indicate cue type and reason): without adaptive equipment. pt unable to reach periareas without twisting and bending. Also needed assist for below knees. Demonstrated use of LHS for washing belows knees and periareas. Husband purchased AE kit. Where Assessed - Lower Body Bathing: Sit to stand from bed Upper Body Dressing: Simulated;Set up Upper Body Dressing Details (indicate cue type and reason): min verbal cues for technique with back precautions. Where Assessed - Upper Body Dressing: Sitting, bed;Unsupported Lower Body Dressing: Maximal assistance;Performed Lower Body Dressing Details (indicate cue type and reason): With donning slippers without AE use Where Assessed - Lower Body Dressing: Sit to stand from bed Toilet Transfer: Minimal assistance Toilet Transfer Method: Ambulating Toilet Transfer Equipment: Other (comment) (straight back chair) Toileting - Clothing Manipulation: Simulated;Minimal assistance Toileting - Clothing Manipulation Details (indicate cue type and reason): min verbal cues to pull up clothing as high on her legs as possible so she doesnt bend when she stands to reach for clothing. Where Assessed - Toileting Clothing Manipulation: Sit to stand from 3-in-1 or toilet Toileting - Hygiene: Simulated;Minimal assistance Where Assessed - Toileting Hygiene:  Sit on 3-in-1 or toilet Tub/Shower Transfer: Not assessed Tub/Shower Transfer Method: Not assessed Equipment Used: Rolling walker ADL Comments: Pt. educated on 3 back precautions with ADLs and techniques for completing LB ADLs with AE. Pt. with increased pain and limited activity to bed-chair with walking around bed ~6'    Cognition Cognition Arousal/Alertness: Awake/alert Overall Cognitive Status: Appears within functional limits for tasks assessed Orientation Level: Oriented X4 Cognition - Other Comments: at times needs cues to slow down as she tends to move quickly so she doesnt break her precautions.   Extremity Assessment RUE Assessment RUE Assessment: Within Functional Limits LUE Assessment LUE Assessment: Within Functional Limits Mobility  Bed Mobility Bed Mobility: Yes Rolling Left: 6: Modified independent (Device/Increase time) Right Sidelying to Sit: 6: Modified independent (Device/Increase time) Transfers Transfers: Yes Sit to Stand: 5: Supervision;With upper extremity assist;From bed Sit to Stand Details (indicate cue type and reason): Min verbal cues for hand placement and technique    End of Session OT - End of Session Equipment Utilized During Treatment: Gait belt Activity Tolerance: Patient limited by pain Patient left: in chair;with call bell in reach;with family/visitor present Nurse Communication: Mobility status for transfers General Behavior During Session: [facility] for tasks performed Cognition: [facility] for tasks performed   Thorsten Climer, OTR/L Pager [phone] 03/19/2011, 2:28 PM

## 2011-03-19 NOTE — Progress Notes (Signed)
Physical Therapy Evaluation Patient Details Name: Taylor Ritter MRN: [id number] DOB: April 01, 1960 Today's Date: 03/19/2011  Problem List: There is no problem list on file for this patient.   Past Medical History:  Past Medical History  Diagnosis Date  . Hypertension     followed by [facility]  . Asthma     daily inhalers allegy shots.followed by [facility]  . GERD (gastroesophageal reflux disease)     on nexium  . Neuromuscular disorder     numbness arms legs,feet   . Fibromyalgia   . Arthritis     osteoporosis  . Depression   . Anxiety     uses xanax as needed   . Headache   . Shortness of breath   . Complication of anesthesia   . Sleep apnea   . Chronic kidney disease     see's kidney doctor for urine retention   Past Surgical History:  Past Surgical History  Procedure Date  . Abdominal hysterectomy   . Cholecystectomy   . Knee arthroscopy     L KNEE  . Carpal tunnel release     R&L  . Anterior fusion cervical spine   . Lumbar laminectomy   . Esophageal dilation   . Cesarean section 1983  . Septoplasty 10+yrs ago  . Lumbar laminectomy/decompression microdiscectomy 02/25/2011    Procedure: LUMBAR LAMINECTOMY/DECOMPRESSION MICRODISCECTOMY;  Surgeon: Karn Cassis;  Location: [facility];  Service: Neurosurgery;  Laterality: N/A;  Lumbar Four-Five Microdiscectomy  . Back surgery 2012    PT Assessment/Plan/Recommendation PT Assessment Clinical Impression Statement: Patient is a 50 yo female admitted for exploration of surgical site for possible CSF leakage/infection following laminectomy on 02/25/11.  Patient with new onset pain right hip and posterior thigh impacting functional mobility.  Patient should progress well.  Do not anticipate any follow-up PT needs. PT Recommendation/Assessment: Patient will need skilled PT in the acute care venue PT Problem List: Decreased strength;Decreased activity tolerance;Decreased  mobility;Pain PT Therapy Diagnosis : Difficulty walking;Acute pain PT Plan PT Frequency: Min 5X/week PT Treatment/Interventions: DME instruction;Gait training;Stair training;Functional mobility training;Patient/family education PT Recommendation Follow Up Recommendations: None Equipment Recommended: None recommended by PT PT Goals  Acute Rehab PT Goals PT Goal Formulation: With patient Time For Goal Achievement: 7 days Pt will Roll Supine to Right Side: Independently PT Goal: Rolling Supine to Right Side - Progress: Not met Pt will Roll Supine to Left Side: Independently PT Goal: Rolling Supine to Left Side - Progress: Not met Pt will go Supine/Side to Sit: Independently;with HOB 0 degrees PT Goal: Supine/Side to Sit - Progress: Not met Pt will go Sit to Supine/Side: Independently;with HOB 0 degrees PT Goal: Sit to Supine/Side - Progress: Not met Pt will go Sit to Stand: with modified independence PT Goal: Sit to Stand - Progress: Not met Pt will go Stand to Sit: with modified independence PT Goal: Stand to Sit - Progress: Not met Pt will Ambulate: >150 feet;with modified independence;with rolling walker PT Goal: Ambulate - Progress: Not met Pt will Go Up / Down Stairs: 3-5 stairs;with least restrictive assistive device;with supervision PT Goal: Up/Down Stairs - Progress: Not met Additional Goals Additional Goal #1: Patient will recall 3/3 back precautions and integrate into functional mobility PT Goal: Additional Goal #1 - Progress: Not met  PT Evaluation Precautions/Restrictions  Precautions Precautions: Back Precaution Booklet Issued: No Required Braces or Orthoses: No Restrictions Weight Bearing Restrictions: No Prior Functioning  Home Living Lives With: Spouse (3 grandchildren 11-17)  Receives Help From: Family Type of Home: House Home Layout: Two level;Able to live on main level with bedroom/bathroom Home Access: Stairs to enter Entrance Stairs-Rails:  Right Entrance Stairs-Number of Steps: 3 Bathroom Shower/Tub: Engineer, manufacturing systems: Standard Bathroom Accessibility: Yes How Accessible: Accessible via walker Home Adaptive Equipment: Walker - rolling;Straight cane;Shower chair with back;Long-handled shoehorn;Reacher;Sock aid Prior Function Level of Independence: Independent with basic ADLs;Independent with homemaking with ambulation;Independent with gait Able to Take Stairs?: Yes Driving: Yes Vocation: On disability Comments: Pt. with recent admit and back surgery on 02/25/11 Cognition Cognition Arousal/Alertness: Awake/alert Overall Cognitive Status: Appears within functional limits for tasks assessed Orientation Level: Oriented X4 Cognition - Other Comments: at times needs cues to slow down as she tends to move quickly so she doesnt break her precautions. Sensation/Coordination Sensation Light Touch: Appears Intact Coordination Gross Motor Movements are Fluid and Coordinated: Yes Extremity Assessment RUE Assessment RUE Assessment: Within Functional Limits LUE Assessment LUE Assessment: Within Functional Limits RLE Assessment RLE Assessment: Within Functional Limits (Difficult to assess due to pain in posterior thigh) LLE Assessment LLE Assessment: Within Functional Limits Mobility (including Balance) Bed Mobility Bed Mobility: Yes Rolling Left: 6: Modified independent (Device/Increase time);With rail Right Sidelying to Sit: 6: Modified independent (Device/Increase time);HOB elevated (comment degrees);With rails (HOB at 40 degrees) Sit to Supine - Right: 6: Modified independent (Device/Increase time);With rail;HOB elevated (comment degrees) (HOB at 40 degrees) Transfers Transfers: Yes Sit to Stand: 5: Supervision;With upper extremity assist;From bed;From toilet Sit to Stand Details (indicate cue type and reason): Cues for hand placement and safety Stand to Sit: 5: Supervision;With upper extremity assist;To  toilet;To bed Stand to Sit Details: Cues for safety, technique, and hand placement Ambulation/Gait Ambulation/Gait: Yes Ambulation/Gait Assistance: 5: Supervision Ambulation/Gait Assistance Details (indicate cue type and reason): Cues to keep hands on RW, and stand upright Ambulation Distance (Feet): 82 Feet Assistive device: Rolling walker Gait Pattern: Decreased stride length Gait velocity: Decreased gait speed    Exercise    End of Session PT - End of Session Equipment Utilized During Treatment: Gait belt Activity Tolerance: Patient limited by pain Patient left: in bed;with call bell in reach;with family/visitor present Nurse Communication: Mobility status for ambulation General Behavior During Session: [facility] for tasks performed Cognition: [facility] for tasks performed  Vena Austria [phone] 03/19/2011, 5:35 PM

## 2011-03-20 NOTE — Progress Notes (Signed)
CSW received consult for SNF. PT/OT with no f/u recommendations. No other CSW needs reported or noted. CSW signing off. Please re-consult if SNF needed.  Dellie Burns, MSW, [place] [phone]

## 2011-03-20 NOTE — Progress Notes (Signed)
Doing well. C/o appropriate incisional soreness.  Still with R leg pain   Temp:  [97.7 F (36.5 C)-99.5 F (37.5 C)] 97.7 F (36.5 C) (12/30 0600) Pulse Rate:  [99-111] 101  (12/30 0600) Resp:  [18-20] 18  (12/30 0600) BP: (94-110)/(60-68) 108/63 mmHg (12/30 0600) SpO2:  [92 %-97 %] 92 % (12/30 0600) Good strength and sensation Incision CDI  Plan: Started decadron yesterday , keep another day - ?d/c in am

## 2011-03-20 NOTE — Progress Notes (Signed)
Physical Therapy Treatment Patient Details Name: Taylor Ritter MRN: [id number] DOB: May 14, 1960 Today's Date: 03/20/2011  PT Assessment/Plan  PT - Assessment/Plan Comments on Treatment Session: Pt moving well & progressing with PT goals- Met sit<>stand & stair goals today.  Pt states that she may d/c home tomorrow (03/21/11)   PT Frequency: Min 5X/week Follow Up Recommendations: None Equipment Recommended: None recommended by PT PT Goals  Acute Rehab PT Goals PT Goal: Rolling Supine to Right Side - Progress: Progressing toward goal PT Goal: Supine/Side to Sit - Progress: Progressing toward goal PT Goal: Sit to Stand - Progress: Met PT Goal: Stand to Sit - Progress: Met PT Goal: Ambulate - Progress: Progressing toward goal PT Goal: Up/Down Stairs - Progress: Met Additional Goals PT Goal: Additional Goal #1 - Progress: Progressing toward goal  PT Treatment Precautions/Restrictions  Precautions Precautions: Back Precaution Booklet Issued: No Required Braces or Orthoses: No Restrictions Weight Bearing Restrictions: No Mobility (including Balance) Bed Mobility Rolling Right: 6: Modified independent (Device/Increase time) Right Sidelying to Sit: 6: Modified independent (Device/Increase time);HOB flat Transfers Sit to Stand: 6: Modified independent (Device/Increase time);With upper extremity assist;From bed Stand to Sit: 6: Modified independent (Device/Increase time);To chair/3-in-1;With upper extremity assist;With armrests Ambulation/Gait Ambulation/Gait Assistance: 5: Supervision Ambulation/Gait Assistance Details (indicate cue type and reason): (S) for safety due to pt states that her right leg feels like it may give way at any time.  Cues for proper use of RW & to stay inside RW with turns.  Ambulation Distance (Feet): 200 Feet Assistive device: Rolling walker Gait Pattern: Step-through pattern Stairs: Yes Stairs Assistance: 6: Modified independent (Device/Increase  time) Stair Management Technique: One rail Right Number of Stairs: 3  Wheelchair Mobility Wheelchair Mobility: No    Exercise    End of Session PT - End of Session Equipment Utilized During Treatment: Gait belt Activity Tolerance: Patient tolerated treatment well Patient left: in chair General Behavior During Session: [facility] for tasks performed Cognition: [facility] for tasks performed  Lara Mulch 03/20/2011, 12:06 PM [phone]

## 2011-03-21 MED ORDER — FLEET ENEMA 7-19 GM/118ML RE ENEM
1.0000 | ENEMA | Freq: Once | RECTAL | Status: AC
  Administered 2011-03-21: 1 via RECTAL
  Filled 2011-03-21: qty 1

## 2011-03-21 MED ORDER — BISACODYL 10 MG RE SUPP
10.0000 mg | Freq: Every day | RECTAL | Status: DC | PRN
  Administered 2011-03-21 – 2011-03-22 (×2): 10 mg via RECTAL
  Filled 2011-03-21 (×3): qty 1

## 2011-03-21 NOTE — Progress Notes (Signed)
Agree with equipment recommendations.  03/21/2011 Cephus Shelling, PT, DPT [phone]

## 2011-03-21 NOTE — Plan of Care (Signed)
Problem: Phase II Progression Outcomes Goal: Progress activity as tolerated unless otherwise ordered Outcome: Not Applicable Date Met:  03/21/11 Pt up and about in her room by herself.  Ignacia Palma, [place] [phone] 03/21/2011

## 2011-03-21 NOTE — Progress Notes (Signed)
Occupational Therapy Evaluation Patient Details Name: Taylor Ritter MRN: [id number] DOB: 03-18-1961 Today's Date: 03/21/2011 9:17-9:50  OT Assessment/Plan OT Assessment/Plan Comments on Treatment Session: All education compeleted with this back pt and pt without further questions/concerns from an OT standpoint. OT Plan: All goals met and education completed, patient discharged from OT services Follow Up Recommendations: None Equipment Recommended: 3 in 1 bedside comode OT Goals ADL Goals ADL Goal: Grooming - Progress: Met ADL Goal: Lower Body Bathing - Progress: Discontinued (comment) (Pt feels she does not need to practice) Pt Will Perform Lower Body Dressing: Other (comment) ADL Goal: Lower Body Dressing - Progress: Discontinued (comment) (Pt does not feel that she needs to practice) ADL Goal: Toilet Transfer - Progress: Met ADL Goal: Tub/Shower Transfer - Progress: Discontinued (comment) (Pt does not feel that she needs to practice)  OT Treatment Precautions/Restrictions  Precautions Precautions: Back Required Braces or Orthoses: No Restrictions Weight Bearing Restrictions: No   ADL ADL Eating/Feeding: Not assessed Grooming: Performed;Wash/dry hands;Independent Where Assessed - Grooming: Standing at sink Upper Body Bathing: Not assessed Lower Body Bathing: Not assessed Upper Body Dressing: Not assessed Lower Body Dressing: Not assessed Toilet Transfer: Performed;Modified independent Toilet Transfer Method: Proofreader: Raised toilet seat with arms (or 3-in-1 over toilet) Toileting - Clothing Manipulation: Performed;Independent Where Assessed - Toileting Clothing Manipulation: Standing Toileting - Hygiene: Performed;Modified independent;Other (comment) (front peri) Toileting - Hygiene Details (indicate cue type and reason): Pt. shown and then issued a toilet aid. Where Assessed - Toileting Hygiene: Standing Tub/Shower Transfer: Not  assessed Tub/Shower Transfer Details (indicate cue type and reason): Pt reports she does not feel that she needs to practice this, is aware that she needs to side step into out of the tub and her husband is there to A her prn.  ADL Comments: Pt does not feel she needs to practice use of AE. Mobility  Bed Mobility Bed Mobility: No Transfers Transfers: Yes Sit to Stand: 6: Modified independent (Device/Increase time);With upper extremity assist;From bed Stand to Sit: 6: Modified independent (Device/Increase time);With upper extremity assist;With armrests;To chair/3-in-1 Exercises    End of Session OT - End of Session Equipment Utilized During Treatment: Other (comment) (None) Activity Tolerance: Patient tolerated treatment well Patient left: in bed General Behavior During Session: [facility] for tasks performed Cognition: [facility] for tasks performed  Taylor Ritter [phone] 03/21/2011, 10:10 AM

## 2011-03-21 NOTE — Progress Notes (Signed)
Pt will need a 3-n-1 for home use.    Ignacia Palma, [place] [phone] 03/21/2011

## 2011-03-21 NOTE — Progress Notes (Signed)
Subjective: Patient reports legs shaky when up.  Still has not moved her bowels.  Objective: Vital signs in last 24 hours: Temp:  [97.4 F (36.3 C)-98.3 F (36.8 C)] 97.4 F (36.3 C) (12/31 0526) Pulse Rate:  [66-104] 96  (12/31 0526) Resp:  [20] 20  (12/31 0526) BP: (100-145)/(63-79) 131/79 mmHg (12/31 0526) SpO2:  [92 %-99 %] 99 % (12/31 0526)  Intake/Output from previous day: 12/30 0701 - 12/31 0700 In: 1305 [P.O.:480; I.V.:825] Out: -  Intake/Output this shift:    Physical Exam: Stable  Lab Results: No results found for this basename: WBC:2,HGB:2,HCT:2,PLT:2 in the last 72 hours BMET No results found for this basename: NA:2,K:2,CL:2,CO2:2,GLUCOSE:2,BUN:2,CREATININE:2,CALCIUM:2 in the last 72 hours  Studies/Results: No results found.  Assessment/Plan: Will work on bowels today and ambulation.  Possibly D/C in am if feeling better.    LOS: 3 days    Dorian Heckle, MD 03/21/2011, 10:06 AM

## 2011-03-21 NOTE — Progress Notes (Signed)
Physical Therapy Treatment Patient Details Name: Taylor Ritter MRN: [id number] DOB: Jul 24, 1960 Today's Date: 03/21/2011  PT Assessment/Plan  PT - Assessment/Plan Comments on Treatment Session: Pt continues to move well.  Pt has a RW at home but is requesting a rollator.  Per pt, her insurance will cover cost of the rollator.   PT Plan: Discharge plan remains appropriate PT Frequency: Min 5X/week Follow Up Recommendations: None Equipment Recommended: Other (comment) (Rollator per pt request.  ) PT Goals  Acute Rehab PT Goals PT Goal: Rolling Supine to Right Side - Progress: Progressing toward goal PT Goal: Rolling Supine to Left Side - Progress: Progressing toward goal PT Goal: Supine/Side to Sit - Progress: Progressing toward goal PT Goal: Sit to Supine/Side - Progress: Progressing toward goal PT Goal: Sit to Stand - Progress: Met PT Goal: Stand to Sit - Progress: Met PT Goal: Ambulate - Progress: Met Additional Goals PT Goal: Additional Goal #1 - Progress: Progressing toward goal  PT Treatment Precautions/Restrictions  Precautions Precautions: Back Precaution Booklet Issued: No Precaution Comments: Pt verbalized 2/3 back precautions but required min cueing to verbalize "no arching".  Pt also needed min cueing to reinforce back precautions with mobility.   Required Braces or Orthoses: No Restrictions Weight Bearing Restrictions: No Mobility (including Balance) Bed Mobility Bed Mobility: No Rolling Right: 6: Modified independent (Device/Increase time) (no cues needed with rolling supine>right side) Rolling Left: 5: Supervision Rolling Left Details (indicate cue type and reason): Cues to reinforce "no twisting" with rolling from right side>supine>left side Right Sidelying to Sit: 6: Modified independent (Device/Increase time);HOB flat Sit to Supine - Right: 6: Modified independent (Device/Increase time);HOB flat Transfers Sit to Stand: 6: Modified independent  (Device/Increase time);From bed;With upper extremity assist Stand to Sit: 6: Modified independent (Device/Increase time);With upper extremity assist Ambulation/Gait Ambulation/Gait Assistance: 6: Modified independent (Device/Increase time) Ambulation/Gait Assistance Details (indicate cue type and reason): Cues ocassionally for "no twisting".  Pt was seen by this therapist earlier in AM ambulating in hallway without (S).   Ambulation Distance (Feet): 300 Feet Assistive device: Rolling walker Gait Pattern: Step-through pattern Stairs: No Wheelchair Mobility Wheelchair Mobility: No    Exercise    End of Session PT - End of Session Equipment Utilized During Treatment: Gait belt Activity Tolerance: Patient tolerated treatment well Patient left: in bed General Behavior During Session: [facility] for tasks performed Cognition: [facility] for tasks performed  Lara Mulch 03/21/2011, 12:40 PM [phone]

## 2011-03-21 NOTE — Progress Notes (Signed)
Utilization review completed. Roseana Rhine, RN, BSN. 03/21/11  

## 2011-03-22 MED ORDER — DOCUSATE SODIUM 100 MG PO CAPS
100.0000 mg | ORAL_CAPSULE | Freq: Every day | ORAL | Status: DC
  Administered 2011-03-22 – 2011-03-24 (×3): 100 mg via ORAL
  Filled 2011-03-22 (×3): qty 1

## 2011-03-22 NOTE — Progress Notes (Signed)
Physical Therapy Treatment Patient Details Name: Taylor Ritter MRN: [id number] DOB: 1960/06/08 Today's Date: 03/22/2011  PT Assessment/Plan  PT - Assessment/Plan Comments on Treatment Session: Pt moving well & has met all PT goals at this time.  Recommended to pt to ambulate with (S) due to pain & right leg feeling weak & wanting to "give out" at times, but none noted during today's therapy session.  Pt is still having "electric shock" feeling in right leg & at beginning of session pt had rapid bouncing movements of rigth leg while sitting in chair that continues for approx. 2 minutes- pt states this has progressively gotten worse & states MD is aware of issue.  Due to pt not requiring any cues for safety or technique with mobility, PT services are now signing off.  Pt is aware.     PT Plan: Discharge plan remains appropriate Follow Up Recommendations: None Equipment Recommended: Other (comment) (rollator per pt request.  ) PT Goals  Acute Rehab PT Goals PT Goal: Rolling Supine to Right Side - Progress: Met PT Goal: Rolling Supine to Left Side - Progress: Met PT Goal: Supine/Side to Sit - Progress: Met PT Goal: Sit to Supine/Side - Progress: Met PT Goal: Sit to Stand - Progress: Met PT Goal: Stand to Sit - Progress: Met PT Goal: Ambulate - Progress: Not met ((S) only for safety due to pain & weakness per pt.  ) PT Goal: Up/Down Stairs - Progress: Met Additional Goals PT Goal: Additional Goal #1 - Progress: Met  PT Treatment Precautions/Restrictions  Precautions Precautions: Back Precaution Booklet Issued: No Precaution Comments: verbalized 3/3 back precautions independently.   Required Braces or Orthoses: No Restrictions Weight Bearing Restrictions: No Mobility (including Balance) Bed Mobility Rolling Right: 7: Independent Rolling Left: 7: Independent Right Sidelying to Sit: 7: Independent Sit to Supine - Right: 7: Independent Sit to Supine - Left: 7:  Independent Transfers Sit to Stand: 6: Modified independent (Device/Increase time) Stand to Sit: 6: Modified independent (Device/Increase time) Ambulation/Gait Ambulation/Gait Assistance: 5: Supervision Ambulation/Gait Assistance Details (indicate cue type and reason): (S) only due to pt states that she feels like her right leg is going to give out at any time & due to pain.  No safety cues needed.   Ambulation Distance (Feet): 350 Feet Assistive device: Rolling walker Gait Pattern: Within Functional Limits Stairs Assistance: 6: Modified independent (Device/Increase time) Stair Management Technique: Sideways;One rail Right Number of Stairs: 6  (3 x 2 trials)  Posture/Postural Control Posture/Postural Control: No significant limitations Exercise    End of Session PT - End of Session Equipment Utilized During Treatment: Gait belt Activity Tolerance: Patient tolerated treatment well Patient left: in chair;with call bell in reach;with family/visitor present General Behavior During Session: [facility] for tasks performed Cognition: [facility] for tasks performed  Lara Mulch 03/22/2011, 1:16 PM [phone]

## 2011-03-22 NOTE — Progress Notes (Signed)
Subjective: Patient reports yet to move bowels.  Legs jumping with "electric shock" feeling and a feeling of weakness right>left leg.  Objective: Vital signs in last 24 hours: Temp:  [97.3 F (36.3 C)-98.6 F (37 C)] 98.4 F (36.9 C) (01/01 0230) Pulse Rate:  [93-107] 101  (01/01 0230) Resp:  [18-20] 20  (01/01 0230) BP: (99-125)/(65-92) 125/71 mmHg (01/01 0230) SpO2:  [92 %-96 %] 96 % (01/01 0230)  Intake/Output from previous day: 12/31 0701 - 01/01 0700 In: 480 [P.O.:480] Out: -  Intake/Output this shift:    Physical Exam: Clonus in both legs, right>left leg with positive Hoffman's signs both hands.  Patient appears to have good strength in arms and legs.  States she has had prior neck surgery with spinal cord problems.  Lab Results: No results found for this basename: WBC:2,HGB:2,HCT:2,PLT:2 in the last 72 hours BMET No results found for this basename: NA:2,K:2,CL:2,CO2:2,GLUCOSE:2,BUN:2,CREATININE:2,CALCIUM:2 in the last 72 hours  Studies/Results: No results found.  Assessment/Plan:  Will continue to work on bowels and ambulation. Patient wishes to discuss clonus and sleep apnea with Dr. Jeral Fruit prior to D/C.    LOS: 4 days    Dorian Heckle, MD 03/22/2011, 7:36 AM

## 2011-03-22 NOTE — Progress Notes (Signed)
Mats Jeanlouis, PT [phone]  

## 2011-03-23 ENCOUNTER — Inpatient Hospital Stay (HOSPITAL_COMMUNITY): Payer: Medicare Other

## 2011-03-23 ENCOUNTER — Encounter (HOSPITAL_COMMUNITY): Payer: Self-pay | Admitting: Neurosurgery

## 2011-03-23 NOTE — Progress Notes (Signed)
Subjective: Patient reports persistent constipation and right hip pain and weakness  Objective: Vital signs in last 24 hours: Temp:  [97.9 F (36.6 C)-98.5 F (36.9 C)] 98 F (36.7 C) (01/02 1510) Pulse Rate:  [80-92] 83  (01/02 1510) Resp:  [17-18] 18  (01/02 1510) BP: (101-122)/(59-72) 111/72 mmHg (01/02 1510) SpO2:  [93 %-100 %] 100 % (01/02 1510)  Intake/Output from previous day:   Intake/Output this shift:    Physical Exam: Notes weakness in right hip with ambulation  Lab Results: No results found for this basename: WBC:2,HGB:2,HCT:2,PLT:2 in the last 72 hours BMET No results found for this basename: NA:2,K:2,CL:2,CO2:2,GLUCOSE:2,BUN:2,CREATININE:2,CALCIUM:2 in the last 72 hours  Studies/Results: No results found.  Assessment/Plan: Will get lumbar and right hip radiographs, also soap suds enema. Continue PT.    LOS: 5 days    Dorian Heckle, MD 03/23/2011, 5:49 PM

## 2011-03-23 NOTE — Progress Notes (Signed)
Utilization review completed. Tarius Stangelo, RN, BSN. 03/23/11 

## 2011-03-24 NOTE — Progress Notes (Signed)
Patient ID: Taylor Ritter, female   DOB: Feb 23, 1961, 51 y.o.   MRN: [id number] Patient returning to bed after changing clothes. No c/o pain at present. Discussed d/c to home with pt at length. She acknowledges ability to care for self at home, but requests "another CT or MRI since its been so long & I need to plan what surgery I may need to have next".  I advised her to discuss these with Dr. Jeral Fruit at her office visit, as they were not justified by her current symptoms/status. She notes that the Dilaudid and Valium have controlled her pain and spasms well. She will call the office today to set up her follow-up appointment with Dr. Jeral Fruit in the next 2-3 weeks.  Oris Drone Maite Burlison, RN,BSN

## 2011-03-24 NOTE — Progress Notes (Signed)
Subjective: Patient reports "I can manage my cough by turning the room down. It's my asthma." I'm ok until I walk; then my right hip hurts."  States, "My only concern about leaving the hospital is needing to return again next week.  Objective: Vital signs in last 24 hours: Temp:  [97.6 F (36.4 C)-98.5 F (36.9 C)] 98.1 F (36.7 C) (01/03 0500) Pulse Rate:  [80-105] 90  (01/03 0500) Resp:  [18] 18  (01/03 0500) BP: (97-142)/(59-76) 105/59 mmHg (01/03 0500) SpO2:  [93 %-100 %] 93 % (01/03 0500)  Intake/Output from previous day:   Intake/Output this shift:    Alert, conversant, without c/o pain at present. Good strength BLE. Incision with staples, minimal erythema at skin edges, no surrounding redness or swelling, or c/o tenderness. Good BM after enema last pm. No cough during visit, improved through night per pt.  Lab Results: No results found for this basename: WBC:2,HGB:2,HCT:2,PLT:2 in the last 72 hours BMET No results found for this basename: NA:2,K:2,CL:2,CO2:2,GLUCOSE:2,BUN:2,CREATININE:2,CALCIUM:2 in the last 72 hours  Studies/Results: Dg Lumbar Spine 2-3 Views  03/23/2011  *RADIOLOGY REPORT*  Clinical Data: Right hip pain status post recent laminectomy.  LUMBAR SPINE - 2-3 VIEW  Comparison: MRI 01/10/2011.  Intraoperative radiographs 02/25/2011.  Findings: There are laminectomy defects at the L4 and L5 levels. The skin staples remain in place.  A mild convex left scoliosis is present.  The lateral alignment is normal.  Mild disc space loss throughout the lumbar spine appears stable.  There is no evidence of acute fracture or bone destruction.  IMPRESSION: Postsurgical changes status post L4 and L5 laminectomy with mild scoliosis.  No acute osseous findings.  Original Report Authenticated By: Gerrianne Scale, M.D.   Dg Hip Complete Right  03/23/2011  *RADIOLOGY REPORT*  Clinical Data: Right hip pain status post recent laminectomy.  RIGHT HIP - COMPLETE 2+ VIEW  Comparison: Hip  radiographs 10/12/2006.  Findings: The mineralization and alignment are normal.  There is no evidence of acute fracture or dislocation.  The hip joint spaces are maintained.  There is no evidence of femoral head osteonecrosis.  Mild degenerative changes are present at the symphysis pubis.  Postsurgical changes are present in the lower lumbar spine.  IMPRESSION: Stable unremarkable right hip radiographs.  Original Report Authenticated By: Gerrianne Scale, M.D.    Assessment/Plan: Stable. Afebrile.  LOS: 6 days  Pt reassured re: stable X-rays lumbar spine & right hip. Discussed possible home or O/P PT for pain control, endurance, & gentle strengthening. Discussed likely d/c to home this afternoon with f/u with Dr. Jeral Fruit in the office.    Georgiann Cocker 03/24/2011, 8:06 AM

## 2011-03-24 NOTE — Discharge Summary (Signed)
Physician Discharge Summary  Patient ID: Taylor Ritter MRN: [id number] DOB/AGE: [birth year] 51 y.o.  Admit date: 03/18/2011 Discharge date: 03/24/2011  Admission Diagnoses:  Discharge Diagnoses:  Active Problems:  * No active hospital problems. *    Discharged Condition: good  Hospital Course: Lumbar Decompressive Laminectomy for spinal stenosis L4-S1 levels  Consults: none  Significant Diagnostic Studies: radiology: LS spine/R  Hip  Treatments:Surgery;  therapies: PT and OT  Discharge Exam: Blood pressure 108/65, pulse 97, temperature 98.5 F (36.9 C), temperature source Axillary, resp. rate 18, height 5\' 6"  (1.676 m), weight 138.8 kg (306 lb), SpO2 100.00%. Neurologic: Alert and oriented X 3, normal strength and tone. Normal symmetric reflexes. Normal coordination and gait Incision/Wound:C/D/I  Disposition: Home or Self Care   Medication List  As of 03/24/2011  1:16 PM   CONTINUE taking these medications         ALPRAZolam 1 MG tablet   Commonly known as: XANAX      ASTEPRO 0.15 % Soln   Generic drug: Azelastine HCl      BEPREVE OP      buPROPion 150 MG 24 hr tablet   Commonly known as: WELLBUTRIN XL      cyclobenzaprine 10 MG tablet   Commonly known as: FLEXERIL      diphenhydrAMINE 50 MG capsule   Commonly known as: BENADRYL      doxycycline 100 MG capsule   Commonly known as: VIBRAMYCIN      DULERA 200-5 MCG/ACT Aero   Generic drug: Mometasone Furo-Formoterol Fum      DULoxetine 60 MG capsule   Commonly known as: CYMBALTA      esomeprazole 40 MG capsule   Commonly known as: NEXIUM      fexofenadine 180 MG tablet   Commonly known as: ALLEGRA      fluticasone 27.5 MCG/SPRAY nasal spray   Commonly known as: VERAMYST      hydrochlorothiazide 25 MG tablet   Commonly known as: HYDRODIURIL      HYDROmorphone 2 MG tablet   Commonly known as: DILAUDID      nitrofurantoin 100 MG capsule   Commonly known as: MACRODANTIN      PRESCRIPTION  MEDICATION      valACYclovir 500 MG tablet   Commonly known as: VALTREX             Signed: Kemia Wendel D 03/24/2011, 1:16 PM

## 2011-03-24 NOTE — Progress Notes (Signed)
Doing well  DC home

## 2011-03-31 LAB — HM MAMMOGRAPHY: HM Mammogram: NEGATIVE

## 2011-05-04 ENCOUNTER — Ambulatory Visit: Payer: Medicare Other | Attending: Neurosurgery | Admitting: Physical Therapy

## 2011-05-04 DIAGNOSIS — R5381 Other malaise: Secondary | ICD-10-CM | POA: Insufficient documentation

## 2011-05-04 DIAGNOSIS — IMO0001 Reserved for inherently not codable concepts without codable children: Secondary | ICD-10-CM | POA: Insufficient documentation

## 2011-05-04 DIAGNOSIS — M545 Low back pain, unspecified: Secondary | ICD-10-CM | POA: Insufficient documentation

## 2011-05-10 ENCOUNTER — Ambulatory Visit: Payer: Medicare Other | Admitting: *Deleted

## 2011-05-12 ENCOUNTER — Ambulatory Visit: Payer: Medicare Other | Admitting: *Deleted

## 2011-05-17 ENCOUNTER — Ambulatory Visit: Payer: Medicare Other | Admitting: Physical Therapy

## 2011-05-19 ENCOUNTER — Ambulatory Visit: Payer: Medicare Other | Admitting: Physical Therapy

## 2011-05-23 ENCOUNTER — Other Ambulatory Visit: Payer: Self-pay | Admitting: Neurosurgery

## 2011-05-23 ENCOUNTER — Ambulatory Visit
Admission: RE | Admit: 2011-05-23 | Discharge: 2011-05-23 | Disposition: A | Discharge status: Home / Self Care | Payer: Medicare Other | Source: Ambulatory Visit | Attending: Neurosurgery | Admitting: Neurosurgery

## 2011-05-23 DIAGNOSIS — M549 Dorsalgia, unspecified: Secondary | ICD-10-CM

## 2011-07-11 ENCOUNTER — Ambulatory Visit (HOSPITAL_BASED_OUTPATIENT_CLINIC_OR_DEPARTMENT_OTHER): Payer: Medicare Other | Attending: Family Medicine | Admitting: General Practice

## 2011-07-11 VITALS — Ht 67.0 in | Wt 280.0 lb

## 2011-07-11 DIAGNOSIS — G4733 Obstructive sleep apnea (adult) (pediatric): Secondary | ICD-10-CM | POA: Insufficient documentation

## 2011-07-16 NOTE — Procedures (Signed)
NAME:  Taylor Ritter, Taylor Ritter               ACCOUNT NO.:  [id number]  MEDICAL RECORD NO.:  [id number]          PATIENT TYPE:  OUT  LOCATION:  SLEEP CENTER                 FACILITY:  [facility]  PHYSICIAN:  Ronell Duffus D. Maple Hudson, MD, FCCP, FACPDATE OF BIRTH:  06-16-1959  DATE OF STUDY:  07/11/2011                           NOCTURNAL POLYSOMNOGRAM  REFERRING PHYSICIAN:  Francis P. Modesto Charon, M.D.  INDICATION FOR STUDY:  Hypersomnia with sleep apnea.  EPWORTH SLEEPINESS SCORE:  Epworth sleepiness score 3/24.  BMI 43.9. Weight 280 pounds, height 67 inches, neck 17 inch.  MEDICATIONS:  Home medications are charted and reviewed.  SLEEP ARCHITECTURE:  Total sleep time 352 minutes with sleep efficiency 90%.  Stage I 6.4%, stage II 75.4%,  stage III absent, REM 18.2% of total sleep time.  Sleep latency 20 minutes, REM latency 177 minutes, awake after sleep onset 19 minutes, arousal index 16.5.  Bedtime medication:  Astepro, Veramyst, Topamax.  RESPIRATORY DATA:  Apnea-hypopnea index (AHI) 13.3 per hour.  A total of 78 events was scored including 20 obstructive apneas, 1 central apnea, 57 hypopneas.  Events were associated with nonsupine sleep position, and REM.  REM AHI 45 per hour.  There were insufficient numbers of early events to meet protocol requirements for initiation of split CPAP titration protocol on this study night.  OXYGEN DATA:  Moderately loud snoring with oxygen desaturation to a nadir of 82% and mean oxygen saturation through the study of 93.3% on room air.  CARDIAC DATA:  Normal sinus rhythm.  MOVEMENT-PARASOMNIA:  Twenty limb jerks were counted of which 6 were associated with arousal or awakening for periodic limb movement with arousal index of 1 per hour.  Bathroom x1.  IMPRESSIONS-RECOMMENDATIONS: 1. Mild obstructive sleep apnea/hypopnea syndrome, AHI 13.3 per hour     with nonsupine events, moderately loud snoring, and oxygen     desaturation to a nadir of 82% with mean oxygen  saturation through     the study of 93.3% on room air. 2. There were insufficient numbers of early events to permit     application of split protocol CPAP titration on this     study.  Consider return for CPAP titration as a dedicated study or     evaluate for alternative management as clinically appropriate.     Merinda Victorino D. Maple Hudson, MD, [facility], FACP Diplomate, American Board of Sleep Medicine    CDY/MEDQ  D:  07/16/2011 13:35:19  T:  07/16/2011 14:47:51  Job:  [id number]

## 2011-07-17 DIAGNOSIS — G4733 Obstructive sleep apnea (adult) (pediatric): Secondary | ICD-10-CM

## 2011-11-23 ENCOUNTER — Ambulatory Visit: Payer: Medicare Other | Admitting: Physical Therapy

## 2012-02-13 ENCOUNTER — Telehealth: Payer: Self-pay | Admitting: *Deleted

## 2012-02-13 NOTE — Telephone Encounter (Signed)
OK with me.

## 2012-02-13 NOTE — Telephone Encounter (Signed)
Patient came by and stated that she would like to become a new patient of Dr. Caryl Never as her spouse Susette Seminara is currently a patient and she has Medicare/BCBS. Please advise.

## 2012-02-28 ENCOUNTER — Encounter: Payer: Self-pay | Admitting: Family Medicine

## 2012-02-28 ENCOUNTER — Ambulatory Visit (INDEPENDENT_AMBULATORY_CARE_PROVIDER_SITE_OTHER): Payer: Medicare Other | Admitting: Family Medicine

## 2012-02-28 VITALS — BP 120/80 | HR 100 | Temp 98.6°F | Resp 12 | Ht 66.0 in | Wt 272.0 lb

## 2012-02-28 DIAGNOSIS — N390 Urinary tract infection, site not specified: Secondary | ICD-10-CM | POA: Insufficient documentation

## 2012-02-28 DIAGNOSIS — K219 Gastro-esophageal reflux disease without esophagitis: Secondary | ICD-10-CM

## 2012-02-28 DIAGNOSIS — F339 Major depressive disorder, recurrent, unspecified: Secondary | ICD-10-CM | POA: Insufficient documentation

## 2012-02-28 DIAGNOSIS — M47812 Spondylosis without myelopathy or radiculopathy, cervical region: Secondary | ICD-10-CM | POA: Insufficient documentation

## 2012-02-28 DIAGNOSIS — K222 Esophageal obstruction: Secondary | ICD-10-CM

## 2012-02-28 DIAGNOSIS — I10 Essential (primary) hypertension: Secondary | ICD-10-CM | POA: Insufficient documentation

## 2012-02-28 DIAGNOSIS — F329 Major depressive disorder, single episode, unspecified: Secondary | ICD-10-CM | POA: Insufficient documentation

## 2012-02-28 DIAGNOSIS — J45909 Unspecified asthma, uncomplicated: Secondary | ICD-10-CM | POA: Insufficient documentation

## 2012-02-28 MED ORDER — ESOMEPRAZOLE MAGNESIUM 40 MG PO CPDR: 40.0000 mg | DELAYED_RELEASE_CAPSULE | Freq: Two times a day (BID) | ORAL | Status: DC

## 2012-02-28 MED ORDER — VALACYCLOVIR HCL 500 MG PO TABS: 500.0000 mg | ORAL_TABLET | Freq: Two times a day (BID) | ORAL | Status: DC

## 2012-02-28 MED ORDER — HYDROCHLOROTHIAZIDE 25 MG PO TABS: 25.0000 mg | ORAL_TABLET | Freq: Every day | ORAL | Status: DC

## 2012-02-28 NOTE — Progress Notes (Signed)
Subjective:    Patient ID: Taylor Ritter, female    DOB: 1960-07-23, 51 y.o.   MRN: [id number]  HPI  Patient new to establish. She has multiple chronic problems as outlined below. She's not had a full physical in quite some time but she has seen gynecologist. She is disabled since 2007 following multiple neck surgeries. Very limited range of motion cervical spine.  She has chronic problems including asthma, osteoarthritis mostly affecting the cervical spine, history of recurrent depression, hypertension, recurrent UTIs.  She's had multiple surgeries including cholecystectomy, appendectomy, hysterectomy 2007 secondary to menorrhagia, multiple spine surgeries, and carpal tunnel surgery right and left hand.  Family history significant for father type 2 diabetes. Mother COPD and coronary disease age 8.  Patient has never smoked. No alcohol use. Disability as above.  Past Medical History  Diagnosis Date  . Hypertension     followed by [facility]  . Asthma     daily inhalers allegy shots.followed by [facility]  . GERD (gastroesophageal reflux disease)     on nexium  . Neuromuscular disorder     numbness arms legs,feet   . Fibromyalgia   . Arthritis     osteoporosis  . Depression   . Anxiety     uses xanax as needed   . Headache   . Shortness of breath   . Complication of anesthesia   . Sleep apnea   . Chronic kidney disease     see's kidney doctor for urine retention   Past Surgical History  Procedure Date  . Abdominal hysterectomy   . Cholecystectomy   . Knee arthroscopy     L KNEE  . Carpal tunnel release     R&L  . Anterior fusion cervical spine   . Lumbar laminectomy   . Esophageal dilation   . Cesarean section 1983  . Septoplasty 10+yrs ago  . Lumbar laminectomy/decompression microdiscectomy 02/25/2011    Procedure: LUMBAR LAMINECTOMY/DECOMPRESSION MICRODISCECTOMY;  Surgeon: Karn Cassis;  Location: [facility];  Service:  Neurosurgery;  Laterality: N/A;  Lumbar Four-Five Microdiscectomy  . Back surgery 2012  . Lumbar wound debridement 03/18/2011    Procedure: LUMBAR WOUND DEBRIDEMENT;  Surgeon: Karn Cassis;  Location: [facility];  Service: Neurosurgery;  Laterality: N/A;  Exploration of Lumbar wound    reports that she has never smoked. She has never used smokeless tobacco. She reports that she does not drink alcohol or use illicit drugs. family history includes Arthritis in her father and mother; COPD in her mother; Cancer in her maternal grandfather and maternal grandmother; Diabetes in her father and maternal grandfather; Heart disease in her father and mother; Hypertension in her mother; Kidney disease in her mother; and Mental illness in her mother.  There is no history of Anesthesia problems, and Hypotension, and Malignant hyperthermia, and Pseudochol deficiency, . Allergies  Allergen Reactions  . Codeine Hives  . Morphine And Related Hives and Itching  . Other Other (See Comments)    Nylon sutures had to remove to allow healing  Vicryl sutures  . Vesicare (Solifenacin Succinate) Itching  . Dye Fdc Red (Red Dye)     Scan dye, broke out in hives all over once with a neck scan  . Percocet (Oxycodone-Acetaminophen)     "makes her crazy"      Review of Systems  Constitutional: Positive for fatigue. Negative for appetite change and unexpected weight change.  Respiratory: Negative for cough and shortness of breath.  Cardiovascular: Negative for chest pain and leg swelling.  Gastrointestinal: Negative for abdominal pain.  Genitourinary: Negative for dysuria.  Neurological: Negative for dizziness and syncope.  Hematological: Negative for adenopathy.       Objective:   Physical Exam  Constitutional: She appears well-developed and well-nourished.  HENT:  Right Ear: External ear normal.  Left Ear: External ear normal.  Mouth/Throat: Oropharynx is clear and moist.  Neck: Neck supple. No  thyromegaly present.  Cardiovascular: Normal rate and regular rhythm.   Pulmonary/Chest: Effort normal and breath sounds normal. No respiratory distress. She has no wheezes. She has no rales.  Musculoskeletal: She exhibits no edema.  Psychiatric: She has a normal mood and affect. Her behavior is normal.          Assessment & Plan:  #1 hypertension. Stable by today's reading. Refill HCTZ for one year #2 asthma followed by allergist. She is encouraged to get flu vaccine and she'll discuss with her allergist Friday. We also encouraged her to consider Pneumovax as she has never had this  #3 history of GERD with remote history of esophageal stricture. Symptomatically stable. Refilled Nexium #4 history of recurrent depression followed by psychiatry and currently stable  #5 history of osteoarthritis mostly involving cervical spine  #6 health maintenance. We've recommend scheduling complete physical and she'll consider after the first of the year

## 2012-02-28 NOTE — Patient Instructions (Addendum)
Consider physical by next year. Consider pneumonia vaccine with your hx of asthma Yearly flu vaccine

## 2012-03-26 ENCOUNTER — Other Ambulatory Visit: Payer: Self-pay | Admitting: *Deleted

## 2012-03-26 MED ORDER — VALACYCLOVIR HCL 500 MG PO TABS: 500.0000 mg | ORAL_TABLET | Freq: Two times a day (BID) | ORAL | Status: DC

## 2012-03-30 ENCOUNTER — Encounter: Payer: Self-pay | Admitting: Internal Medicine

## 2012-07-17 ENCOUNTER — Other Ambulatory Visit (INDEPENDENT_AMBULATORY_CARE_PROVIDER_SITE_OTHER): Payer: Medicare Other

## 2012-07-17 DIAGNOSIS — I10 Essential (primary) hypertension: Secondary | ICD-10-CM

## 2012-07-17 DIAGNOSIS — Z0000 Encounter for general adult medical examination without abnormal findings: Secondary | ICD-10-CM

## 2012-07-17 LAB — POCT URINALYSIS DIPSTICK
Glucose, UA: NEGATIVE
Nitrite, UA: NEGATIVE
Protein, UA: NEGATIVE
Spec Grav, UA: 1.015
Urobilinogen, UA: 0.2

## 2012-07-17 LAB — HEPATIC FUNCTION PANEL
Albumin: 3.8 g/dL (ref 3.5–5.2)
Alkaline Phosphatase: 114 U/L (ref 39–117)
Total Protein: 7.4 g/dL (ref 6.0–8.3)

## 2012-07-17 LAB — BASIC METABOLIC PANEL
CO2: 28 mEq/L (ref 19–32)
Calcium: 9.3 mg/dL (ref 8.4–10.5)
Chloride: 105 mEq/L (ref 96–112)
Creatinine, Ser: 1.1 mg/dL (ref 0.4–1.2)
Glucose, Bld: 101 mg/dL — ABNORMAL HIGH (ref 70–99)
Sodium: 140 mEq/L (ref 135–145)

## 2012-07-17 LAB — CBC WITH DIFFERENTIAL/PLATELET
Basophils Absolute: 0 10*3/uL (ref 0.0–0.1)
Eosinophils Absolute: 0.2 10*3/uL (ref 0.0–0.7)
Hemoglobin: 12.8 g/dL (ref 12.0–15.0)
Lymphocytes Relative: 29.5 % (ref 12.0–46.0)
MCHC: 34.5 g/dL (ref 30.0–36.0)
MCV: 89.8 fl (ref 78.0–100.0)
Monocytes Absolute: 0.4 10*3/uL (ref 0.1–1.0)
Neutro Abs: 4.3 10*3/uL (ref 1.4–7.7)
RDW: 13.9 % (ref 11.5–14.6)

## 2012-07-17 LAB — LIPID PANEL
Cholesterol: 190 mg/dL (ref 0–200)
Triglycerides: 166 mg/dL — ABNORMAL HIGH (ref 0.0–149.0)

## 2012-07-17 LAB — TSH: TSH: 1.74 u[IU]/mL (ref 0.35–5.50)

## 2012-07-24 ENCOUNTER — Encounter: Payer: Medicare Other | Admitting: Family Medicine

## 2012-08-02 ENCOUNTER — Ambulatory Visit (INDEPENDENT_AMBULATORY_CARE_PROVIDER_SITE_OTHER): Payer: Medicare Other | Admitting: Family Medicine

## 2012-08-02 ENCOUNTER — Encounter: Payer: Self-pay | Admitting: Family Medicine

## 2012-08-02 VITALS — BP 120/80 | HR 72 | Temp 98.7°F | Resp 12 | Ht 66.0 in | Wt 271.0 lb

## 2012-08-02 DIAGNOSIS — Z0000 Encounter for general adult medical examination without abnormal findings: Secondary | ICD-10-CM

## 2012-08-02 DIAGNOSIS — Z23 Encounter for immunization: Secondary | ICD-10-CM

## 2012-08-02 MED ORDER — CYCLOBENZAPRINE HCL 10 MG PO TABS: 10.0000 mg | ORAL_TABLET | Freq: Three times a day (TID) | ORAL | Status: DC | PRN

## 2012-08-02 NOTE — Patient Instructions (Addendum)
Confirm date of last tetanus Schedule repeat mammography and colonoscopy as discussed

## 2012-08-02 NOTE — Progress Notes (Signed)
Subjective:    Patient ID: Taylor Ritter, female    DOB: [birth year], 52 y.o.   MRN: [id number]  HPI Patient seen for complete physical. She continues to see gynecologist regularly. Last tetanus is less than 10 years ago but she cannot confirm date. She has history of asthma which is well-controlled but she's not had prior Pneumovax. She is in process of scheduling repeat colonoscopy Patient is nonsmoker She is in process of scheduling repeat mammogram  Chronic problems include history of obesity, asthma, depression, chronic pain related to her multiple back surgeries, GERD, hypertension, esophageal stricture She is followed by chronic pain management and also by psychiatry.  Past Medical History  Diagnosis Date  . Hypertension     followed by [facility]  . Asthma     daily inhalers allegy shots.followed by [facility]  . Neuromuscular disorder     numbness arms legs,feet   . Fibromyalgia   . Arthritis     osteoporosis  . Depression   . Anxiety     uses xanax as needed   . Headache   . Shortness of breath   . Complication of anesthesia   . Sleep apnea   . Chronic kidney disease     see's kidney doctor for urine retention  . GERD (gastroesophageal reflux disease)     ,hx of stricture   Past Surgical History  Procedure Laterality Date  . Abdominal hysterectomy    . Cholecystectomy    . Knee arthroscopy      L KNEE  . Carpal tunnel release      R&L  . Anterior fusion cervical spine    . Lumbar laminectomy    . Esophageal dilation    . Cesarean section  1983  . Septoplasty  10+yrs ago  . Lumbar laminectomy/decompression microdiscectomy  02/25/2011    Procedure: LUMBAR LAMINECTOMY/DECOMPRESSION MICRODISCECTOMY;  Surgeon: Karn Cassis;  Location: [facility];  Service: Neurosurgery;  Laterality: N/A;  Lumbar Four-Five Microdiscectomy  . Back surgery  2012  . Lumbar wound debridement  03/18/2011    Procedure: LUMBAR WOUND  DEBRIDEMENT;  Surgeon: Karn Cassis;  Location: [facility];  Service: Neurosurgery;  Laterality: N/A;  Exploration of Lumbar wound    reports that she has never smoked. She has never used smokeless tobacco. She reports that she does not drink alcohol or use illicit drugs. family history includes Arthritis in her father and mother; COPD in her mother; Cancer in her maternal grandfather and maternal grandmother; Diabetes in her father and maternal grandfather; Heart disease in her father; Heart disease (age of onset: 59) in her mother; Hypertension in her mother; Kidney disease in her mother; and Mental illness in her mother.  There is no history of Anesthesia problems, and Hypotension, and Malignant hyperthermia, and Pseudochol deficiency, . Allergies  Allergen Reactions  . Codeine Hives  . Morphine And Related Hives and Itching  . Other Other (See Comments)    Nylon sutures had to remove to allow healing  Vicryl sutures  . Vesicare (Solifenacin Succinate) Itching  . Dye Fdc Red (Red Dye)     Scan dye, broke out in hives all over once with a neck scan  . Percocet (Oxycodone-Acetaminophen)     "makes her crazy"      Review of Systems  Constitutional: Negative for fever, activity change, appetite change, fatigue and unexpected weight change.  HENT: Negative for hearing loss, ear pain, sore throat, trouble swallowing and voice change.  Eyes: Negative for visual disturbance.  Respiratory: Negative for cough, shortness of breath and wheezing.   Cardiovascular: Negative for chest pain, palpitations and leg swelling.  Gastrointestinal: Negative for nausea, vomiting, abdominal pain, diarrhea, constipation and blood in stool.  Genitourinary: Negative for dysuria and hematuria.  Musculoskeletal: Positive for back pain and arthralgias. Negative for myalgias.  Skin: Negative for rash.  Neurological: Negative for dizziness, syncope and headaches.  Hematological: Negative for adenopathy.   Psychiatric/Behavioral: Negative for confusion and dysphoric mood.       Objective:   Physical Exam  Constitutional: She is oriented to person, place, and time. She appears well-developed and well-nourished.  HENT:  Head: Normocephalic and atraumatic.  Eyes: EOM are normal. Pupils are equal, round, and reactive to light.  Neck: Normal range of motion. Neck supple. No thyromegaly present.  Cardiovascular: Normal rate, regular rhythm and normal heart sounds.   No murmur heard. Pulmonary/Chest: Breath sounds normal. No respiratory distress. She has no wheezes. She has no rales.  Abdominal: Soft. Bowel sounds are normal. She exhibits no distension and no mass. There is no tenderness. There is no rebound and no guarding.  Genitourinary:  Per gyn  Musculoskeletal: Normal range of motion. She exhibits no edema.  Lymphadenopathy:    She has no cervical adenopathy.  Neurological: She is alert and oriented to person, place, and time. She displays normal reflexes. No cranial nerve deficit.  Skin: No rash noted.  Psychiatric: She has a normal mood and affect. Her behavior is normal. Judgment and thought content normal.          Assessment & Plan:  Complete physical. Labs reviewed with patient. Recommend Pneumovax (asthma history). She will confirm date of last tetanus. Refilled Flexeril. She is in process of scheduling repeat colonoscopy and mammogram. She continues to see gynecologist. We discussed weight loss strategies. She is very limited in exercise because of her chronic neck and low back pain

## 2012-08-03 ENCOUNTER — Encounter: Payer: Self-pay | Admitting: Family Medicine

## 2012-08-09 ENCOUNTER — Ambulatory Visit (INDEPENDENT_AMBULATORY_CARE_PROVIDER_SITE_OTHER): Payer: Medicare Other | Admitting: Family Medicine

## 2012-08-09 ENCOUNTER — Encounter: Payer: Self-pay | Admitting: Family Medicine

## 2012-08-09 VITALS — BP 110/64 | Temp 99.3°F | Wt 276.0 lb

## 2012-08-09 DIAGNOSIS — N644 Mastodynia: Secondary | ICD-10-CM

## 2012-08-09 DIAGNOSIS — Z23 Encounter for immunization: Secondary | ICD-10-CM

## 2012-08-09 NOTE — Patient Instructions (Addendum)
Proceed with annual mammography Followup promptly for any skin changes such as redness or increased warmth.

## 2012-08-09 NOTE — Progress Notes (Signed)
Subjective:    Patient ID: Taylor Ritter, female    DOB: [birth year], 52 y.o.   MRN: [id number]  HPI Patient seen with some tenderness just lateral to left breast Noted couple days ago. No redness. No axillary adenopathy. No nipple discharge. No history of injury. Last mammogram was approximately one year ago normal. Pain is relatively mild. Denies any fever, chills, cough, or dyspnea.  Past Medical History  Diagnosis Date  . Hypertension     followed by [facility]  . Asthma     daily inhalers allegy shots.followed by [facility]  . Neuromuscular disorder     numbness arms legs,feet   . Fibromyalgia   . Arthritis     osteoporosis  . Depression   . Anxiety     uses xanax as needed   . Headache   . Shortness of breath   . Complication of anesthesia   . Sleep apnea   . Chronic kidney disease     see's kidney doctor for urine retention  . GERD (gastroesophageal reflux disease)     ,hx of stricture   Past Surgical History  Procedure Laterality Date  . Abdominal hysterectomy    . Cholecystectomy    . Knee arthroscopy      L KNEE  . Carpal tunnel release      R&L  . Anterior fusion cervical spine    . Lumbar laminectomy    . Esophageal dilation    . Cesarean section  1983  . Septoplasty  10+yrs ago  . Lumbar laminectomy/decompression microdiscectomy  02/25/2011    Procedure: LUMBAR LAMINECTOMY/DECOMPRESSION MICRODISCECTOMY;  Surgeon: Karn Cassis;  Location: [facility];  Service: Neurosurgery;  Laterality: N/A;  Lumbar Four-Five Microdiscectomy  . Back surgery  2012  . Lumbar wound debridement  03/18/2011    Procedure: LUMBAR WOUND DEBRIDEMENT;  Surgeon: Karn Cassis;  Location: [facility];  Service: Neurosurgery;  Laterality: N/A;  Exploration of Lumbar wound    reports that she has never smoked. She has never used smokeless tobacco. She reports that she does not drink alcohol or use illicit drugs. family history includes  Arthritis in her father and mother; COPD in her mother; Cancer in her maternal grandfather and maternal grandmother; Diabetes in her father and maternal grandfather; Heart disease in her father; Heart disease (age of onset: 34) in her mother; Hypertension in her mother; Kidney disease in her mother; and Mental illness in her mother.  There is no history of Anesthesia problems, and Hypotension, and Malignant hyperthermia, and Pseudochol deficiency, . Allergies  Allergen Reactions  . Codeine Hives  . Morphine And Related Hives and Itching  . Other Other (See Comments)    Nylon sutures had to remove to allow healing  Vicryl sutures  . Vesicare (Solifenacin Succinate) Itching  . Dye Fdc Red (Red Dye)     Scan dye, broke out in hives all over once with a neck scan  . Percocet (Oxycodone-Acetaminophen)     "makes her crazy"      Review of Systems  Constitutional: Negative for fever and chills.  Skin: Negative for rash.  Hematological: Negative for adenopathy.       Objective:   Physical Exam  Constitutional: She appears well-developed and well-nourished.  Cardiovascular: Normal rate and regular rhythm.   Pulmonary/Chest: Effort normal and breath sounds normal. No respiratory distress. She has no wheezes. She has no rales.  Patient has large breasts. No distinct masses. No nipple discharge.  No skin changes such as erythema or dimpling. No ecchymosis. No axillary adenopathy. No chest wall adenopathy or mass          Assessment & Plan:  Left lateral breast pain. Nonfocal exam. Proceed with her yearly screening mammography.

## 2012-08-09 NOTE — Addendum Note (Signed)
Addended by: Melchor Amour on: 08/09/2012 02:29 PM   Modules accepted: Orders

## 2012-08-10 ENCOUNTER — Telehealth: Payer: Self-pay | Admitting: Family Medicine

## 2012-08-10 DIAGNOSIS — N644 Mastodynia: Secondary | ICD-10-CM

## 2012-08-10 NOTE — Telephone Encounter (Signed)
Order placed

## 2012-08-10 NOTE — Telephone Encounter (Signed)
Cara @ [facility] needs you to change the type of mammogram for pt to a "Diagnostic Bilateral" due to the reason for mammogram is "left breast pain."

## 2012-08-22 ENCOUNTER — Ambulatory Visit
Admission: RE | Admit: 2012-08-22 | Discharge: 2012-08-22 | Disposition: A | Discharge status: Home / Self Care | Payer: Medicare Other | Source: Ambulatory Visit | Attending: Family Medicine | Admitting: Family Medicine

## 2012-08-22 DIAGNOSIS — N644 Mastodynia: Secondary | ICD-10-CM

## 2012-09-05 ENCOUNTER — Encounter: Payer: Self-pay | Admitting: Family Medicine

## 2012-09-27 ENCOUNTER — Other Ambulatory Visit: Payer: Self-pay | Admitting: Family Medicine

## 2012-09-27 NOTE — Telephone Encounter (Signed)
Refill for 6 months. 

## 2012-10-09 ENCOUNTER — Other Ambulatory Visit: Payer: Self-pay | Admitting: Family Medicine

## 2012-12-07 ENCOUNTER — Telehealth: Payer: Self-pay | Admitting: Family Medicine

## 2012-12-07 ENCOUNTER — Ambulatory Visit (INDEPENDENT_AMBULATORY_CARE_PROVIDER_SITE_OTHER): Payer: Medicare Other | Admitting: Family Medicine

## 2012-12-07 ENCOUNTER — Other Ambulatory Visit: Payer: Self-pay

## 2012-12-07 ENCOUNTER — Encounter: Payer: Self-pay | Admitting: Family Medicine

## 2012-12-07 VITALS — BP 130/80 | HR 89 | Temp 99.0°F | Wt 272.0 lb

## 2012-12-07 DIAGNOSIS — M5417 Radiculopathy, lumbosacral region: Secondary | ICD-10-CM

## 2012-12-07 DIAGNOSIS — IMO0002 Reserved for concepts with insufficient information to code with codable children: Secondary | ICD-10-CM

## 2012-12-07 MED ORDER — METHYLPREDNISOLONE ACETATE 80 MG/ML IJ SUSP
80.0000 mg | Freq: Once | INTRAMUSCULAR | Status: AC
  Administered 2012-12-07: 80 mg via INTRAMUSCULAR

## 2012-12-07 MED ORDER — VALACYCLOVIR HCL 500 MG PO TABS: ORAL_TABLET | ORAL | Status: DC

## 2012-12-07 NOTE — Telephone Encounter (Signed)
Patient Information:  Caller Name: [place]  Phone: [phone]  Patient: Taylor Ritter, Taylor Ritter  Gender: Female  DOB: [birth year]  Age: 52 Years  PCP: Evelena Peat [facility])  Pregnant: No  Office Follow Up:  Does the office need to follow up with this patient?: No  Instructions For The Office: N/A  Symptoms  Reason For Call & Symptoms: Pain Left hip started Fri 9/12, pain radiating down Left leg.  Pain so severe today 9/19 having problems putting on socks or shoes.  Leg buckled  but was able to catchself, problems walkiing.  Some back pain also .  Reviewed Health History In EMR: Yes  Reviewed Medications In EMR: Yes  Reviewed Allergies In EMR: Yes  Reviewed Surgeries / Procedures: Yes  Date of Onset of Symptoms: 11/30/2012  Treatments Tried: Relaxing. Motrin 800mg  30 minutes ago  Treatments Tried Worked: No OB / GYN:  LMP: Unknown  Guideline(s) Used:  Back Pain  Disposition Per Guideline:   See Today in Office  Reason For Disposition Reached:   Can't walk or can barely walk  Advice Given:  N/A  Patient Will Follow Care Advice:  YES  Appointment Scheduled:  12/07/2012 14:30:00 Appointment Scheduled Provider:  Evelena Peat (Family Practice)

## 2012-12-07 NOTE — Progress Notes (Signed)
Subjective:    Patient ID: Taylor Ritter, female    DOB: 10/10/1960, 52 y.o.   MRN: [id number]  HPI  Acute visit. Patient presents with left lower back pain. Onset last Friday. No specific injury. Pain radiates from her lower lumbar area into her left buttock and down toward her left knee. She's had multiple previous back surgeries. She's had question of some increased weakness left lower extremity. No significant numbness. She had left over Flexeril took one last night without much improvement. Pain is at rest and also with changing positions. No loss of bladder or bowel control  Past Medical History  Diagnosis Date  . Hypertension     followed by [facility]  . Asthma     daily inhalers allegy shots.followed by [facility]  . Neuromuscular disorder     numbness arms legs,feet   . Fibromyalgia   . Arthritis     osteoporosis  . Depression   . Anxiety     uses xanax as needed   . Headache(784.0)   . Shortness of breath   . Complication of anesthesia   . Sleep apnea   . Chronic kidney disease     see's kidney doctor for urine retention  . GERD (gastroesophageal reflux disease)     ,hx of stricture   Past Surgical History  Procedure Laterality Date  . Abdominal hysterectomy    . Cholecystectomy    . Knee arthroscopy      L KNEE  . Carpal tunnel release      R&L  . Anterior fusion cervical spine    . Lumbar laminectomy    . Esophageal dilation    . Cesarean section  1983  . Septoplasty  10+yrs ago  . Lumbar laminectomy/decompression microdiscectomy  02/25/2011    Procedure: LUMBAR LAMINECTOMY/DECOMPRESSION MICRODISCECTOMY;  Surgeon: Karn Cassis;  Location: [facility];  Service: Neurosurgery;  Laterality: N/A;  Lumbar Four-Five Microdiscectomy  . Back surgery  2012  . Lumbar wound debridement  03/18/2011    Procedure: LUMBAR WOUND DEBRIDEMENT;  Surgeon: Karn Cassis;  Location: [facility];  Service: Neurosurgery;  Laterality:  N/A;  Exploration of Lumbar wound    reports that she has never smoked. She has never used smokeless tobacco. She reports that she does not drink alcohol or use illicit drugs. family history includes Arthritis in her father and mother; COPD in her mother; Cancer in her maternal grandfather and maternal grandmother; Diabetes in her father and maternal grandfather; Heart disease in her father; Heart disease (age of onset: 47) in her mother; Hypertension in her mother; Kidney disease in her mother; Mental illness in her mother. There is no history of Anesthesia problems, Hypotension, Malignant hyperthermia, or Pseudochol deficiency. Allergies  Allergen Reactions  . Codeine Hives  . Morphine And Related Hives and Itching  . Other Other (See Comments)    Nylon sutures had to remove to allow healing  Vicryl sutures  . Vesicare [Solifenacin Succinate] Itching  . Dye Fdc Red [Red Dye]     Scan dye, broke out in hives all over once with a neck scan  . Percocet [Oxycodone-Acetaminophen]     "makes her crazy"     Review of Systems  Respiratory: Negative for cough and shortness of breath.   Genitourinary: Negative for dysuria and difficulty urinating.  Musculoskeletal: Positive for back pain.  Neurological: Negative for numbness.       Objective:   Physical Exam  Constitutional: She appears  well-developed and well-nourished.  Cardiovascular: Normal rate and regular rhythm.   Pulmonary/Chest: Effort normal and breath sounds normal. No respiratory distress. She has no wheezes. She has no rales.  Musculoskeletal:  Straight leg raise is positive for malaise. No edema. She has some nonspecific mild tenderness to palpation left lower lumbar region  Neurological:  Diminished left ankle reflex compared to the right and is apparently chronic. Knee reflexes are symmetric. No focal weakness.          Assessment & Plan:  Left lumbar radiculopathy. Patient has scheduled followup with neurosurgeon  but this is a couple weeks from now. She is requesting corticosteroids which have helped in the past. Depo-Medrol 80 mg intramuscular. She'll continue Flexeril as needed.

## 2012-12-07 NOTE — Patient Instructions (Addendum)

## 2012-12-24 ENCOUNTER — Telehealth: Payer: Self-pay

## 2012-12-24 NOTE — Telephone Encounter (Signed)
Pt has been sch

## 2012-12-24 NOTE — Telephone Encounter (Signed)
Can you please call and schedule this patient for a office visit to fill out DMV papers. If there are  Two 15 mins slots together that will be fine.

## 2013-01-02 ENCOUNTER — Ambulatory Visit (INDEPENDENT_AMBULATORY_CARE_PROVIDER_SITE_OTHER): Payer: Medicare Other | Admitting: Family Medicine

## 2013-01-02 ENCOUNTER — Other Ambulatory Visit: Payer: Self-pay

## 2013-01-02 ENCOUNTER — Encounter: Payer: Self-pay | Admitting: Family Medicine

## 2013-01-02 VITALS — BP 120/70 | HR 105 | Temp 98.2°F | Wt 263.0 lb

## 2013-01-02 DIAGNOSIS — M47812 Spondylosis without myelopathy or radiculopathy, cervical region: Secondary | ICD-10-CM

## 2013-01-02 DIAGNOSIS — Z23 Encounter for immunization: Secondary | ICD-10-CM

## 2013-01-02 MED ORDER — HYDROCHLOROTHIAZIDE 25 MG PO TABS: 25.0000 mg | ORAL_TABLET | Freq: Every day | ORAL | Status: DC

## 2013-01-02 MED ORDER — ESOMEPRAZOLE MAGNESIUM 40 MG PO CPDR: 40.0000 mg | DELAYED_RELEASE_CAPSULE | Freq: Two times a day (BID) | ORAL | Status: DC

## 2013-01-02 NOTE — Progress Notes (Signed)
Subjective:    Patient ID: Taylor Ritter, female    DOB: 08-29-1960, 52 y.o.   MRN: [id number]  HPI Patient is here to have DMV forms completed. She has history of multiple back surgeries including cervical and lumbar spine. Most recent surgery was 2012. She has regular orthopedic followup. She does not have any major chronic pain issues. She is on Topamax and has been followed by pain management but symptoms are well controlled She does have some limited range of motion with rotation to the right and left with cervical spine but is able to compensate by turning her body.  She does not have any major weakness involving upper or lower extremities and no major or paresthesias. Patient still requests flu vaccine.  Past Medical History  Diagnosis Date  . Hypertension     followed by [facility]  . Asthma     daily inhalers allegy shots.followed by [facility]  . Neuromuscular disorder     numbness arms legs,feet   . Fibromyalgia   . Arthritis     osteoporosis  . Depression   . Anxiety     uses xanax as needed   . Headache(784.0)   . Shortness of breath   . Complication of anesthesia   . Sleep apnea   . Chronic kidney disease     see's kidney doctor for urine retention  . GERD (gastroesophageal reflux disease)     ,hx of stricture   Past Surgical History  Procedure Laterality Date  . Abdominal hysterectomy    . Cholecystectomy    . Knee arthroscopy      L KNEE  . Carpal tunnel release      R&L  . Anterior fusion cervical spine    . Lumbar laminectomy    . Esophageal dilation    . Cesarean section  1983  . Septoplasty  10+yrs ago  . Lumbar laminectomy/decompression microdiscectomy  02/25/2011    Procedure: LUMBAR LAMINECTOMY/DECOMPRESSION MICRODISCECTOMY;  Surgeon: Karn Cassis;  Location: [facility];  Service: Neurosurgery;  Laterality: N/A;  Lumbar Four-Five Microdiscectomy  . Back surgery  2012  . Lumbar wound debridement   03/18/2011    Procedure: LUMBAR WOUND DEBRIDEMENT;  Surgeon: Karn Cassis;  Location: [facility];  Service: Neurosurgery;  Laterality: N/A;  Exploration of Lumbar wound    reports that she has never smoked. She has never used smokeless tobacco. She reports that she does not drink alcohol or use illicit drugs. family history includes Arthritis in her father and mother; COPD in her mother; Cancer in her maternal grandfather and maternal grandmother; Diabetes in her father and maternal grandfather; Heart disease in her father; Heart disease (age of onset: 12) in her mother; Hypertension in her mother; Kidney disease in her mother; Mental illness in her mother. There is no history of Anesthesia problems, Hypotension, Malignant hyperthermia, or Pseudochol deficiency. Allergies  Allergen Reactions  . Codeine Hives  . Morphine And Related Hives and Itching  . Other Other (See Comments)    Nylon sutures had to remove to allow healing  Vicryl sutures  . Vesicare [Solifenacin Succinate] Itching  . Dye Fdc Red [Red Dye]     Scan dye, broke out in hives all over once with a neck scan  . Percocet [Oxycodone-Acetaminophen]     "makes her crazy"      Review of Systems  Constitutional: Negative for fever, appetite change and unexpected weight change.  Respiratory: Negative for shortness of breath.  Cardiovascular: Negative for chest pain.  Gastrointestinal: Negative for abdominal pain.  Musculoskeletal: Positive for back pain, neck pain and neck stiffness. Negative for myalgias.  Neurological: Negative for dizziness, weakness and numbness.       Objective:   Physical Exam  Constitutional: She appears well-developed and well-nourished.  Neck: Neck supple.  Cardiovascular: Normal rate and regular rhythm.   Pulmonary/Chest: Effort normal and breath sounds normal. No respiratory distress. She has no wheezes. She has no rales.  Musculoskeletal: She exhibits no edema.  Patient has limitation  of rotation cervical spine right and left about 15. She is able to flex her neck fairly well but has limited extension.  Deep tendon reflexes are symmetric upper extremities. She had difficulty relaxing left ankle to check lower extremity reflexes otherwise symmetric. She has no focal strength deficits. No muscle atrophy  Psychiatric: She has a normal mood and affect. Her behavior is normal.          Assessment & Plan:  Patient has chronic degenerative disc disease involving cervical and lumbar spine. DMV forms are completed. We do not see any contraindications to driving at this time. She'll continue close orthopedic followup. Flu vaccine given. Over 25 minutes evaluating patient and completing her forms.

## 2013-04-23 ENCOUNTER — Telehealth: Payer: Self-pay | Admitting: Family Medicine

## 2013-04-23 NOTE — Telephone Encounter (Signed)
Husband name is Lilyian Quayle and his meds is venlafaxine 300 mg, busdirone 15 mg, and xanax 1mg .  Wife meds are wellbutrin 350 mg, VIIBRYD 40 mg, and xanax 1mg . Pt states they have been on all of the meds for years with dr.burchette

## 2013-04-23 NOTE — Telephone Encounter (Signed)
Who is the patient husband and what rx do they need?

## 2013-04-23 NOTE — Telephone Encounter (Signed)
Pt is calling wanted to know if dr.burchette can write her rx for control substance medications due to dr.lugo returning to [facility] rather than finding a new psychologist. Both her and her husband are both pt of dr burchette and both are needed the rx.

## 2013-05-01 ENCOUNTER — Telehealth: Payer: Self-pay

## 2013-05-01 MED ORDER — BUPROPION HCL ER (XL) 150 MG PO TB24: 150.0000 mg | ORAL_TABLET | Freq: Every day | ORAL | Status: DC

## 2013-05-01 MED ORDER — VILAZODONE HCL 40 MG PO TABS: 40.0000 mg | ORAL_TABLET | Freq: Every day | ORAL | Status: DC

## 2013-05-01 NOTE — Telephone Encounter (Signed)
dr.burchette can write her rx for control substance medications due to dr.lugo returning to [facility] [place] rather than finding a new psychologist  Xanax Last visit 01/02/13 Dr. Sabra Heck was prescribing her the medication 1 po tid daily as needed.  Dr. Sabra Heck prescribed the Xanax for 3 month [facility]

## 2013-05-05 NOTE — Telephone Encounter (Signed)
With any controlled substance we need to get records and clear directive from previous provider that they will no longer be prescribing.

## 2013-05-06 NOTE — Telephone Encounter (Signed)
Pt informed and she will be sending her records over to the office

## 2013-07-29 ENCOUNTER — Telehealth: Payer: Self-pay | Admitting: Family Medicine

## 2013-07-29 NOTE — Telephone Encounter (Signed)
Patient is requesting a refill for ALPRAZolam (Tab) XANAX 1 MG

## 2013-07-29 NOTE — Telephone Encounter (Signed)
We haven't patient was getting the medication through her psychiatrist but her and the husband no longer go there.

## 2013-07-29 NOTE — Telephone Encounter (Signed)
Clarify.  i dont think we have prescribed in past.

## 2013-07-29 NOTE — Telephone Encounter (Signed)
Last visit 01/02/13

## 2013-07-30 MED ORDER — ALPRAZOLAM 1 MG PO TABS: 1.0000 mg | ORAL_TABLET | Freq: Three times a day (TID) | ORAL | Status: DC | PRN

## 2013-07-30 NOTE — Telephone Encounter (Signed)
Called in pharmacy. Pt is aware to make appointment, Pt is going to have records sent over to office.

## 2013-07-30 NOTE — Telephone Encounter (Signed)
In order to transition controlled med from one provider to another, we need records from previous prescriber to review indications, current dosing, etc.  May refill once until we can get and schedule follow up.

## 2013-08-01 ENCOUNTER — Ambulatory Visit (INDEPENDENT_AMBULATORY_CARE_PROVIDER_SITE_OTHER): Payer: Medicare Other | Admitting: Family Medicine

## 2013-08-01 ENCOUNTER — Encounter: Payer: Self-pay | Admitting: Family Medicine

## 2013-08-01 VITALS — BP 130/82 | HR 108 | Temp 98.2°F | Wt 269.0 lb

## 2013-08-01 DIAGNOSIS — F329 Major depressive disorder, single episode, unspecified: Secondary | ICD-10-CM

## 2013-08-01 DIAGNOSIS — J40 Bronchitis, not specified as acute or chronic: Secondary | ICD-10-CM

## 2013-08-01 DIAGNOSIS — R5383 Other fatigue: Secondary | ICD-10-CM

## 2013-08-01 DIAGNOSIS — Z0000 Encounter for general adult medical examination without abnormal findings: Secondary | ICD-10-CM

## 2013-08-01 DIAGNOSIS — R5381 Other malaise: Secondary | ICD-10-CM

## 2013-08-01 DIAGNOSIS — F3289 Other specified depressive episodes: Secondary | ICD-10-CM

## 2013-08-01 DIAGNOSIS — F32A Depression, unspecified: Secondary | ICD-10-CM

## 2013-08-01 MED ORDER — METHYLPREDNISOLONE ACETATE 80 MG/ML IJ SUSP
80.0000 mg | Freq: Once | INTRAMUSCULAR | Status: AC
  Administered 2013-08-01: 80 mg via INTRAMUSCULAR

## 2013-08-01 MED ORDER — ESOMEPRAZOLE MAGNESIUM 40 MG PO CPDR: 40.0000 mg | DELAYED_RELEASE_CAPSULE | Freq: Two times a day (BID) | ORAL | Status: DC

## 2013-08-01 NOTE — Patient Instructions (Signed)
Acute Bronchitis Bronchitis is inflammation of the airways that extend from the windpipe into the lungs (bronchi). The inflammation often causes mucus to develop. This leads to a cough, which is the most common symptom of bronchitis.  In acute bronchitis, the condition usually develops suddenly and goes away over time, usually in a couple weeks. Smoking, allergies, and asthma can make bronchitis worse. Repeated episodes of bronchitis may cause further lung problems.  CAUSES Acute bronchitis is most often caused by the same virus that causes a cold. The virus can spread from person to person (contagious).  SIGNS AND SYMPTOMS   Cough.   Fever.   Coughing up mucus.   Body aches.   Chest congestion.   Chills.   Shortness of breath.   Sore throat.  DIAGNOSIS  Acute bronchitis is usually diagnosed through a physical exam. Tests, such as chest X-rays, are sometimes done to rule out other conditions.  TREATMENT  Acute bronchitis usually goes away in a couple weeks. Often times, no medical treatment is necessary. Medicines are sometimes given for relief of fever or cough. Antibiotics are usually not needed but may be prescribed in certain situations. In some cases, an inhaler may be recommended to help reduce shortness of breath and control the cough. A cool mist vaporizer may also be used to help thin bronchial secretions and make it easier to clear the chest.  HOME CARE INSTRUCTIONS  Get plenty of rest.   Drink enough fluids to keep your urine clear or pale yellow (unless you have a medical condition that requires fluid restriction). Increasing fluids may help thin your secretions and will prevent dehydration.   Only take over-the-counter or prescription medicines as directed by your health care provider.   Avoid smoking and secondhand smoke. Exposure to cigarette smoke or irritating chemicals will make bronchitis worse. If you are a smoker, consider using nicotine gum or skin  patches to help control withdrawal symptoms. Quitting smoking will help your lungs heal faster.   Reduce the chances of another bout of acute bronchitis by washing your hands frequently, avoiding people with cold symptoms, and trying not to touch your hands to your mouth, nose, or eyes.   Follow up with your health care provider as directed.  SEEK MEDICAL CARE IF: Your symptoms do not improve after 1 week of treatment.  SEEK IMMEDIATE MEDICAL CARE IF:  You develop an increased fever or chills.   You have chest pain.   You have severe shortness of breath.  You have bloody sputum.   You develop dehydration.  You develop fainting.  You develop repeated vomiting.  You develop a severe headache. MAKE SURE YOU:   Understand these instructions.  Will watch your condition.  Will get help right away if you are not doing well or get worse. Document Released: 04/14/2004 Document Revised: 11/07/2012 Document Reviewed: 08/28/2012 Exit[employer] Patient Information 2014 [employer].  

## 2013-08-01 NOTE — Progress Notes (Signed)
Subjective:    Patient ID: Taylor Ritter, female    DOB: [birth year], 53 y.o.   MRN: [id number]  HPI Patient is here for the following issues  She has a history of recurrent depression and chronic anxiety. She seems psychiatrist for years but is recently requested we take over those prescriptions. She states her depression is been stable. She takes a combination of Wellbutrin and vibrid.  Check Xanax 1 mg 3 times a day apparently has been on this for many years.  She has acute issue of cough laryngitis and intermittent wheezing for the past 5 or 6 days. No fevers or chills. He states the only thing that helps in situations prednisone the past. She takes do layer a but is currently only taking one puff twice daily instead of standard 2 puffs. She uses intermittent albuterol is not helping. She also takes nasal steroid.  She complains of general fatigue issues. She is worried about possible low B12. No history of low B12. She does take regular Nexium for GERD. She is not vegetarian  Past Medical History  Diagnosis Date  . Hypertension     followed by [facility]  . Asthma     daily inhalers allegy shots.followed by [facility]  . Neuromuscular disorder     numbness arms legs,feet   . Fibromyalgia   . Arthritis     osteoporosis  . Depression   . Anxiety     uses xanax as needed   . Headache(784.0)   . Shortness of breath   . Complication of anesthesia   . Sleep apnea   . Chronic kidney disease     see's kidney doctor for urine retention  . GERD (gastroesophageal reflux disease)     ,hx of stricture   Past Surgical History  Procedure Laterality Date  . Abdominal hysterectomy    . Cholecystectomy    . Knee arthroscopy      L KNEE  . Carpal tunnel release      R&L  . Anterior fusion cervical spine    . Lumbar laminectomy    . Esophageal dilation    . Cesarean section  1983  . Septoplasty  10+yrs ago  . Lumbar laminectomy/decompression  microdiscectomy  02/25/2011    Procedure: LUMBAR LAMINECTOMY/DECOMPRESSION MICRODISCECTOMY;  Surgeon: Floyce Stakes;  Location: [facility];  Service: Neurosurgery;  Laterality: N/A;  Lumbar Four-Five Microdiscectomy  . Back surgery  2012  . Lumbar wound debridement  03/18/2011    Procedure: LUMBAR WOUND DEBRIDEMENT;  Surgeon: Floyce Stakes;  Location: [facility];  Service: Neurosurgery;  Laterality: N/A;  Exploration of Lumbar wound    reports that she has never smoked. She has never used smokeless tobacco. She reports that she does not drink alcohol or use illicit drugs. family history includes Arthritis in her father and mother; COPD in her mother; Cancer in her maternal grandfather and maternal grandmother; Diabetes in her father and maternal grandfather; Heart disease in her father; Heart disease (age of onset: 62) in her mother; Hypertension in her mother; Kidney disease in her mother; Mental illness in her mother. There is no history of Anesthesia problems, Hypotension, Malignant hyperthermia, or Pseudochol deficiency. Allergies  Allergen Reactions  . Codeine Hives  . Morphine And Related Hives and Itching  . Other Other (See Comments)    Nylon sutures had to remove to allow healing  Vicryl sutures  . Vesicare [Solifenacin Succinate] Itching  . Dye Fdc Red [Red  Dye]     Scan dye, broke out in hives all over once with a neck scan  . Percocet [Oxycodone-Acetaminophen]     "makes her crazy"      Review of Systems  Constitutional: Positive for fatigue. Negative for fever and chills.  Respiratory: Positive for cough and wheezing. Negative for shortness of breath.   Cardiovascular: Negative for chest pain and leg swelling.       Objective:   Physical Exam  Constitutional: She appears well-developed and well-nourished.  HENT:  Right Ear: External ear normal.  Left Ear: External ear normal.  Mouth/Throat: Oropharynx is clear and moist.  Neck: Neck supple. No thyromegaly  present.  Cardiovascular: Normal rate and regular rhythm.   Pulmonary/Chest: Effort normal and breath sounds normal. No respiratory distress. She has no wheezes. She has no rales.          Assessment & Plan:  #1 acute bronchitis/laryngitis. Suspect viral. No indication for antibiotic at this time. We have suggested that she increase her Dulera to 2 puffs twice daily which is standard dose. Continue albuterol as needed. Depo-Medrol 80 mg IM. #2 history of recurrent depression. Clinically stable. Patient requesting that we take over her psychotropic medications. We still need to get records from her psychiatrist. #3 fatigue issues. Patient requesting B12 level which is probably reasonable since she has chronic proton pump inhibitor use. Schedule future labs for complete physical including B12.

## 2013-08-01 NOTE — Progress Notes (Signed)
Pre visit review using our clinic review tool, if applicable. No additional management support is needed unless otherwise documented below in the visit note. 

## 2013-08-07 ENCOUNTER — Encounter: Payer: Self-pay | Admitting: *Deleted

## 2013-08-07 ENCOUNTER — Other Ambulatory Visit (INDEPENDENT_AMBULATORY_CARE_PROVIDER_SITE_OTHER): Payer: Medicare Other

## 2013-08-07 DIAGNOSIS — Z0000 Encounter for general adult medical examination without abnormal findings: Secondary | ICD-10-CM

## 2013-08-07 DIAGNOSIS — I10 Essential (primary) hypertension: Secondary | ICD-10-CM

## 2013-08-07 LAB — BASIC METABOLIC PANEL
BUN: 14 mg/dL (ref 6–23)
CO2: 26 mEq/L (ref 19–32)
CREATININE: 1.2 mg/dL (ref 0.4–1.2)
Calcium: 9.5 mg/dL (ref 8.4–10.5)
Chloride: 107 mEq/L (ref 96–112)
GFR: 51.27 mL/min — AB (ref >60.00)
Glucose, Bld: 91 mg/dL (ref 70–99)
Potassium: 3.8 mEq/L (ref 3.5–5.1)
SODIUM: 142 meq/L (ref 135–145)

## 2013-08-07 LAB — HEPATIC FUNCTION PANEL
ALK PHOS: 85 U/L (ref 39–117)
ALT: 15 U/L (ref 0–35)
AST: 21 U/L (ref 0–37)
Albumin: 3.9 g/dL (ref 3.5–5.2)
Bilirubin, Direct: 0.1 mg/dL (ref 0.0–0.3)
TOTAL PROTEIN: 7.2 g/dL (ref 6.0–8.3)
Total Bilirubin: 0.6 mg/dL (ref 0.2–1.2)

## 2013-08-07 LAB — CBC WITH DIFFERENTIAL/PLATELET
BASOS ABS: 0 10*3/uL (ref 0.0–0.1)
Basophils Relative: 0.3 % (ref 0.0–3.0)
EOS PCT: 3.9 % (ref 0.0–5.0)
Eosinophils Absolute: 0.3 10*3/uL (ref 0.0–0.7)
HEMATOCRIT: 36.7 % (ref 36.0–46.0)
HEMOGLOBIN: 12.3 g/dL (ref 12.0–15.0)
LYMPHS ABS: 2.4 10*3/uL (ref 0.7–4.0)
Lymphocytes Relative: 29.5 % (ref 12.0–46.0)
MCHC: 33.4 g/dL (ref 30.0–36.0)
MCV: 90.4 fl (ref 78.0–100.0)
MONO ABS: 0.5 10*3/uL (ref 0.1–1.0)
MONOS PCT: 6.4 % (ref 3.0–12.0)
NEUTROS ABS: 4.8 10*3/uL (ref 1.4–7.7)
Neutrophils Relative %: 59.9 % (ref 43.0–77.0)
PLATELETS: 251 10*3/uL (ref 150.0–400.0)
RBC: 4.06 Mil/uL (ref 3.87–5.11)
RDW: 13.5 % (ref 11.5–15.5)
WBC: 8 10*3/uL (ref 4.0–10.5)

## 2013-08-07 LAB — LIPID PANEL
CHOL/HDL RATIO: 4
Cholesterol: 198 mg/dL (ref 0–200)
HDL: 45.8 mg/dL (ref >39.00)
LDL Cholesterol: 111 mg/dL — ABNORMAL HIGH (ref 0–99)
TRIGLYCERIDES: 205 mg/dL — AB (ref 0.0–149.0)
VLDL: 41 mg/dL — ABNORMAL HIGH (ref 0.0–40.0)

## 2013-08-07 LAB — VITAMIN B12: Vitamin B-12: 184 pg/mL — ABNORMAL LOW (ref 211–911)

## 2013-08-07 LAB — TSH: TSH: 4.76 u[IU]/mL — ABNORMAL HIGH (ref 0.35–4.50)

## 2013-08-08 ENCOUNTER — Other Ambulatory Visit: Payer: Self-pay | Admitting: Family Medicine

## 2013-08-08 ENCOUNTER — Ambulatory Visit (INDEPENDENT_AMBULATORY_CARE_PROVIDER_SITE_OTHER): Payer: Medicare Other | Admitting: Family Medicine

## 2013-08-08 DIAGNOSIS — E538 Deficiency of other specified B group vitamins: Secondary | ICD-10-CM

## 2013-08-08 DIAGNOSIS — E039 Hypothyroidism, unspecified: Secondary | ICD-10-CM

## 2013-08-08 MED ORDER — CYANOCOBALAMIN 1000 MCG/ML IJ SOLN
1000.0000 ug | Freq: Once | INTRAMUSCULAR | Status: AC
  Administered 2013-08-08: 1000 ug via INTRAMUSCULAR

## 2013-08-20 ENCOUNTER — Telehealth: Payer: Self-pay | Admitting: Family Medicine

## 2013-08-20 NOTE — Telephone Encounter (Signed)
Pt would like a 90 day refill of ALPRAZolam (XANAX) 1 MG tablet [facility]

## 2013-08-20 NOTE — Telephone Encounter (Signed)
Last visit 08/01/13 Last refill 07/30/13 #60 0 refill

## 2013-08-21 ENCOUNTER — Other Ambulatory Visit: Payer: Self-pay | Admitting: Family Medicine

## 2013-08-21 MED ORDER — ALPRAZOLAM 1 MG PO TABS: 1.0000 mg | ORAL_TABLET | Freq: Three times a day (TID) | ORAL | Status: DC | PRN

## 2013-08-21 NOTE — Telephone Encounter (Signed)
Rx called in to pharmacy. 

## 2013-08-21 NOTE — Telephone Encounter (Signed)
Refill for 3 months. 

## 2013-08-22 ENCOUNTER — Ambulatory Visit: Payer: Medicare Other | Admitting: Family Medicine

## 2013-08-23 ENCOUNTER — Encounter: Payer: Self-pay | Admitting: Family Medicine

## 2013-08-23 ENCOUNTER — Ambulatory Visit: Payer: Medicare Other | Admitting: Family Medicine

## 2013-08-23 ENCOUNTER — Ambulatory Visit (INDEPENDENT_AMBULATORY_CARE_PROVIDER_SITE_OTHER): Payer: Medicare Other | Admitting: Family Medicine

## 2013-08-23 VITALS — BP 130/78 | HR 83 | Temp 98.6°F | Ht 66.0 in | Wt 271.0 lb

## 2013-08-23 DIAGNOSIS — Z0000 Encounter for general adult medical examination without abnormal findings: Secondary | ICD-10-CM

## 2013-08-23 DIAGNOSIS — E538 Deficiency of other specified B group vitamins: Secondary | ICD-10-CM

## 2013-08-23 MED ORDER — CYANOCOBALAMIN 1000 MCG/ML IJ SOLN
1000.0000 ug | Freq: Once | INTRAMUSCULAR | Status: AC
  Administered 2013-08-23: 1000 ug via INTRAMUSCULAR

## 2013-08-23 NOTE — Patient Instructions (Signed)
Hypertriglyceridemia  Diet for High blood levels of Triglycerides Most fats in food are triglycerides. Triglycerides in your blood are stored as fat in your body. High levels of triglycerides in your blood may put you at a greater risk for heart disease and stroke.  Normal triglyceride levels are less than 150 mg/dL. Borderline high levels are 150-199 mg/dl. High levels are 200 - 499 mg/dL, and very high triglyceride levels are greater than 500 mg/dL. The decision to treat high triglycerides is generally based on the level. For people with borderline or high triglyceride levels, treatment includes weight loss and exercise. Drugs are recommended for people with very high triglyceride levels. Many people who need treatment for high triglyceride levels have metabolic syndrome. This syndrome is a collection of disorders that often include: insulin resistance, high blood pressure, blood clotting problems, high cholesterol and triglycerides. TESTING PROCEDURE FOR TRIGLYCERIDES  You should not eat 4 hours before getting your triglycerides measured. The normal range of triglycerides is between 10 and 250 milligrams per deciliter (mg/dl). Some people may have extreme levels (1000 or above), but your triglyceride level may be too high if it is above 150 mg/dl, depending on what other risk factors you have for heart disease.  People with high blood triglycerides may also have high blood cholesterol levels. If you have high blood cholesterol as well as high blood triglycerides, your risk for heart disease is probably greater than if you only had high triglycerides. High blood cholesterol is one of the main risk factors for heart disease. CHANGING YOUR DIET  Your weight can affect your blood triglyceride level. If you are more than 20% above your ideal body weight, you may be able to lower your blood triglycerides by losing weight. Eating less and exercising regularly is the best way to combat this. Fat provides more  calories than any other food. The best way to lose weight is to eat less fat. Only 30% of your total calories should come from fat. Less than 7% of your diet should come from saturated fat. A diet low in fat and saturated fat is the same as a diet to decrease blood cholesterol. By eating a diet lower in fat, you may lose weight, lower your blood cholesterol, and lower your blood triglyceride level.  Eating a diet low in fat, especially saturated fat, may also help you lower your blood triglyceride level. Ask your dietitian to help you figure how much fat you can eat based on the number of calories your caregiver has prescribed for you.  Exercise, in addition to helping with weight loss may also help lower triglyceride levels.   Alcohol can increase blood triglycerides. You may need to stop drinking alcoholic beverages.  Too much carbohydrate in your diet may also increase your blood triglycerides. Some complex carbohydrates are necessary in your diet. These may include bread, rice, potatoes, other starchy vegetables and cereals.  Reduce "simple" carbohydrates. These may include pure sugars, candy, honey, and jelly without losing other nutrients. If you have the kind of high blood triglycerides that is affected by the amount of carbohydrates in your diet, you will need to eat less sugar and less high-sugar foods. Your caregiver can help you with this.  Adding 2-4 grams of fish oil (EPA+ DHA) may also help lower triglycerides. Speak with your caregiver before adding any supplements to your regimen. Following the Diet  Maintain your ideal weight. Your caregivers can help you with a diet. Generally, eating less food and getting more   exercise will help you lose weight. Joining a weight control group may also help. Ask your caregivers for a good weight control group in your area.  Eat low-fat foods instead of high-fat foods. This can help you lose weight too.  These foods are lower in fat. Eat MORE of these:    Dried beans, peas, and lentils.  Egg whites.  Low-fat cottage cheese.  Fish.  Lean cuts of meat, such as round, sirloin, rump, and flank (cut extra fat off meat you fix).  Whole grain breads, cereals and pasta.  Skim and nonfat dry milk.  Low-fat yogurt.  Poultry without the skin.  Cheese made with skim or part-skim milk, such as mozzarella, parmesan, farmers', ricotta, or pot cheese. These are higher fat foods. Eat LESS of these:   Whole milk and foods made from whole milk, such as American, blue, cheddar, monterey jack, and swiss cheese  High-fat meats, such as luncheon meats, sausages, knockwurst, bratwurst, hot dogs, ribs, corned beef, ground pork, and regular ground beef.  Fried foods. Limit saturated fats in your diet. Substituting unsaturated fat for saturated fat may decrease your blood triglyceride level. You will need to read package labels to know which products contain saturated fats.  These foods are high in saturated fat. Eat LESS of these:   Fried pork skins.  Whole milk.  Skin and fat from poultry.  Palm oil.  Butter.  Shortening.  Cream cheese.  Bacon.  Margarines and baked goods made from listed oils.  Vegetable shortenings.  Chitterlings.  Fat from meats.  Coconut oil.  Palm kernel oil.  Lard.  Cream.  Sour cream.  Fatback.  Coffee whiteners and non-dairy creamers made with these oils.  Cheese made from whole milk. Use unsaturated fats (both polyunsaturated and monounsaturated) moderately. Remember, even though unsaturated fats are better than saturated fats; you still want a diet low in total fat.  These foods are high in unsaturated fat:   Canola oil.  Sunflower oil.  Mayonnaise.  Almonds.  Peanuts.  Pine nuts.  Margarines made with these oils.  Safflower oil.  Olive oil.  Avocados.  Cashews.  Peanut butter.  Sunflower seeds.  Soybean oil.  Peanut  oil.  Olives.  Pecans.  Walnuts.  Pumpkin seeds. Avoid sugar and other high-sugar foods. This will decrease carbohydrates without decreasing other nutrients. Sugar in your food goes rapidly to your blood. When there is excess sugar in your blood, your liver may use it to make more triglycerides. Sugar also contains calories without other important nutrients.  Eat LESS of these:   Sugar, brown sugar, powdered sugar, jam, jelly, preserves, honey, syrup, molasses, pies, candy, cakes, cookies, frosting, pastries, colas, soft drinks, punches, fruit drinks, and regular gelatin.  Avoid alcohol. Alcohol, even more than sugar, may increase blood triglycerides. In addition, alcohol is high in calories and low in nutrients. Ask for sparkling water, or a diet soft drink instead of an alcoholic beverage. Suggestions for planning and preparing meals   Bake, broil, grill or roast meats instead of frying.  Remove fat from meats and skin from poultry before cooking.  Add spices, herbs, lemon juice or vinegar to vegetables instead of salt, rich sauces or gravies.  Use a non-stick skillet without fat or use no-stick sprays.  Cool and refrigerate stews and broth. Then remove the hardened fat floating on the surface before serving.  Refrigerate meat drippings and skim off fat to make low-fat gravies.  Serve more fish.  Use less butter,   margarine and other high-fat spreads on bread or vegetables.  Use skim or reconstituted non-fat dry milk for cooking.  Cook with low-fat cheeses.  Substitute low-fat yogurt or cottage cheese for all or part of the sour cream in recipes for sauces, dips or congealed salads.  Use half yogurt/half mayonnaise in salad recipes.  Substitute evaporated skim milk for cream. Evaporated skim milk or reconstituted non-fat dry milk can be whipped and substituted for whipped cream in certain recipes.  Choose fresh fruits for dessert instead of high-fat foods such as pies or  cakes. Fruits are naturally low in fat. When Dining Out   Order low-fat appetizers such as fruit or vegetable juice, pasta with vegetables or tomato sauce.  Select clear, rather than cream soups.  Ask that dressings and gravies be served on the side. Then use less of them.  Order foods that are baked, broiled, poached, steamed, stir-fried, or roasted.  Ask for margarine instead of butter, and use only a small amount.  Drink sparkling water, unsweetened tea or coffee, or diet soft drinks instead of alcohol or other sweet beverages. QUESTIONS AND ANSWERS ABOUT OTHER FATS IN THE BLOOD: SATURATED FAT, TRANS FAT, AND CHOLESTEROL What is trans fat? Trans fat is a type of fat that is formed when vegetable oil is hardened through a process called hydrogenation. This process helps makes foods more solid, gives them shape, and prolongs their shelf life. Trans fats are also called hydrogenated or partially hydrogenated oils.  What do saturated fat, trans fat, and cholesterol in foods have to do with heart disease? Saturated fat, trans fat, and cholesterol in the diet all raise the level of LDL "bad" cholesterol in the blood. The higher the LDL cholesterol, the greater the risk for coronary heart disease (CHD). Saturated fat and trans fat raise LDL similarly.  What foods contain saturated fat, trans fat, and cholesterol? High amounts of saturated fat are found in animal products, such as fatty cuts of meat, chicken skin, and full-fat dairy products like butter, whole milk, cream, and cheese, and in tropical vegetable oils such as palm, palm kernel, and coconut oil. Trans fat is found in some of the same foods as saturated fat, such as vegetable shortening, some margarines (especially hard or stick margarine), crackers, cookies, baked goods, fried foods, salad dressings, and other processed foods made with partially hydrogenated vegetable oils. Small amounts of trans fat also occur naturally in some animal  products, such as milk products, beef, and lamb. Foods high in cholesterol include liver, other organ meats, egg yolks, shrimp, and full-fat dairy products. How can I use the new food label to make heart-healthy food choices? Check the Nutrition Facts panel of the food label. Choose foods lower in saturated fat, trans fat, and cholesterol. For saturated fat and cholesterol, you can also use the Percent Daily Value (%DV): 5% DV or less is low, and 20% DV or more is high. (There is no %DV for trans fat.) Use the Nutrition Facts panel to choose foods low in saturated fat and cholesterol, and if the trans fat is not listed, read the ingredients and limit products that list shortening or hydrogenated or partially hydrogenated vegetable oil, which tend to be high in trans fat. POINTS TO REMEMBER:   Discuss your risk for heart disease with your caregivers, and take steps to reduce risk factors.  Change your diet. Choose foods that are low in saturated fat, trans fat, and cholesterol.  Add exercise to your daily routine if   it is not already being done. Participate in physical activity of moderate intensity, like brisk walking, for at least 30 minutes on most, and preferably all days of the week. No time? Break the 30 minutes into three, 10-minute segments during the day.  Stop smoking. If you do smoke, contact your caregiver to discuss ways in which they can help you quit.  Do not use street drugs.  Maintain a normal weight.  Maintain a healthy blood pressure.  Keep up with your blood work for checking the fats in your blood as directed by your caregiver. Document Released: 12/24/2003 Document Revised: 09/06/2011 Document Reviewed: 07/21/2008 [employer] Patient Information 2014 [employer].  

## 2013-08-23 NOTE — Progress Notes (Signed)
Pre visit review using our clinic review tool, if applicable. No additional management support is needed unless otherwise documented below in the visit note. 

## 2013-08-23 NOTE — Progress Notes (Signed)
Subjective:    Patient ID: Taylor Ritter, female    DOB: July 17, 1960, 53 y.o.   MRN: [id number]  HPI Here for complete physical. She sees gynecologist regularly. She is due for repeat mammogram. Recent lab significant for slightly elevated TSH and low B12. She has started IM replacement of B12. She's had some occasional burning sensation in her mouth. No neuropathy symptoms. Gen. fatigue issues. Weight has been stable for several years. Immunizations are up to date. Nonsmoker  She is very limited in exercise secondary to some chronic back pains.  Past Medical History  Diagnosis Date  . Hypertension     followed by [facility]  . Asthma     daily inhalers allegy shots.followed by [facility]  . Neuromuscular disorder     numbness arms legs,feet   . Fibromyalgia   . Arthritis     osteoporosis  . Depression   . Anxiety     uses xanax as needed   . Headache(784.0)   . Shortness of breath   . Complication of anesthesia   . Sleep apnea   . Chronic kidney disease     see's kidney doctor for urine retention  . GERD (gastroesophageal reflux disease)     ,hx of stricture   Past Surgical History  Procedure Laterality Date  . Abdominal hysterectomy    . Cholecystectomy    . Knee arthroscopy      L KNEE  . Carpal tunnel release      R&L  . Anterior fusion cervical spine    . Lumbar laminectomy    . Esophageal dilation    . Cesarean section  1983  . Septoplasty  10+yrs ago  . Lumbar laminectomy/decompression microdiscectomy  02/25/2011    Procedure: LUMBAR LAMINECTOMY/DECOMPRESSION MICRODISCECTOMY;  Surgeon: Floyce Stakes;  Location: [place] [facility];  Service: Neurosurgery;  Laterality: N/A;  Lumbar Four-Five Microdiscectomy  . Back surgery  2012  . Lumbar wound debridement  03/18/2011    Procedure: LUMBAR WOUND DEBRIDEMENT;  Surgeon: Floyce Stakes;  Location: [place] [facility];  Service: Neurosurgery;  Laterality: N/A;  Exploration of Lumbar wound     reports that she has never smoked. She has never used smokeless tobacco. She reports that she does not drink alcohol or use illicit drugs. family history includes Arthritis in her father and mother; COPD in her mother; Cancer in her maternal grandfather and maternal grandmother; Diabetes in her father and maternal grandfather; Heart disease in her father; Heart disease (age of onset: 50) in her mother; Hypertension in her mother; Kidney disease in her mother; Mental illness in her mother. There is no history of Anesthesia problems, Hypotension, Malignant hyperthermia, or Pseudochol deficiency. Allergies  Allergen Reactions  . Codeine Hives  . Morphine And Related Hives and Itching  . Other Other (See Comments)    Nylon sutures had to remove to allow healing  Vicryl sutures  . Vesicare [Solifenacin Succinate] Itching  . Dye Fdc Red [Red Dye]     Scan dye, broke out in hives all over once with a neck scan  . Percocet [Oxycodone-Acetaminophen]     "makes her crazy"      Review of Systems  Constitutional: Positive for fatigue. Negative for fever, activity change, appetite change and unexpected weight change.  HENT: Negative for ear pain, hearing loss, sore throat and trouble swallowing.   Eyes: Negative for visual disturbance.  Respiratory: Negative for cough and shortness of breath.   Cardiovascular: Negative for  chest pain and palpitations.  Gastrointestinal: Negative for abdominal pain, diarrhea, constipation and blood in stool.  Endocrine: Negative for polydipsia and polyuria.  Genitourinary: Negative for dysuria and hematuria.  Musculoskeletal: Positive for back pain. Negative for arthralgias and myalgias.  Skin: Negative for rash.  Neurological: Negative for dizziness, syncope and headaches.  Hematological: Negative for adenopathy.  Psychiatric/Behavioral: Negative for confusion and dysphoric mood.       Objective:   Physical Exam  Constitutional: She is oriented to  person, place, and time. She appears well-developed and well-nourished.  HENT:  Head: Normocephalic and atraumatic.  Mouth/Throat: Oropharynx is clear and moist.  Eyes: EOM are normal. Pupils are equal, round, and reactive to light.  Neck: Normal range of motion. Neck supple. No thyromegaly present.  Cardiovascular: Normal rate, regular rhythm and normal heart sounds.   No murmur heard. Pulmonary/Chest: Breath sounds normal. No respiratory distress. She has no wheezes. She has no rales.  Abdominal: Soft. Bowel sounds are normal. She exhibits no distension and no mass. There is no tenderness. There is no rebound and no guarding.  Genitourinary:  Per GYN  Musculoskeletal: Normal range of motion. She exhibits no edema.  Lymphadenopathy:    She has no cervical adenopathy.  Neurological: She is alert and oriented to person, place, and time. She displays normal reflexes. No cranial nerve deficit.  Skin: No rash noted.  Psychiatric: She has a normal mood and affect. Her behavior is normal. Judgment and thought content normal.          Assessment & Plan:  Health maintenance. Continue GYN followup. Continue yearly mammograms. We've recommended that she try to lose weight. She gets very limited exercise. We have suggested trying something such as recumbent bike. Restrict overall calories especially in beverages. Check future labs with free T4, TSH, B12 level in 2-3 months

## 2013-08-28 ENCOUNTER — Ambulatory Visit: Payer: Medicare Other | Admitting: Family Medicine

## 2013-09-05 ENCOUNTER — Ambulatory Visit (INDEPENDENT_AMBULATORY_CARE_PROVIDER_SITE_OTHER): Payer: Medicare Other | Admitting: Family Medicine

## 2013-09-05 DIAGNOSIS — E538 Deficiency of other specified B group vitamins: Secondary | ICD-10-CM

## 2013-09-05 MED ORDER — CYANOCOBALAMIN 1000 MCG/ML IJ SOLN
1000.0000 ug | Freq: Once | INTRAMUSCULAR | Status: AC
  Administered 2013-09-05: 1000 ug via INTRAMUSCULAR

## 2013-09-19 ENCOUNTER — Ambulatory Visit (INDEPENDENT_AMBULATORY_CARE_PROVIDER_SITE_OTHER): Payer: Medicare Other | Admitting: Family Medicine

## 2013-09-19 DIAGNOSIS — E538 Deficiency of other specified B group vitamins: Secondary | ICD-10-CM

## 2013-09-19 MED ORDER — CYANOCOBALAMIN 1000 MCG/ML IJ SOLN
1000.0000 ug | Freq: Once | INTRAMUSCULAR | Status: AC
  Administered 2013-09-19: 1000 ug via INTRAMUSCULAR

## 2013-10-14 ENCOUNTER — Other Ambulatory Visit: Payer: Self-pay | Admitting: Dermatology

## 2013-10-24 ENCOUNTER — Encounter: Payer: Self-pay | Admitting: *Deleted

## 2013-10-24 ENCOUNTER — Other Ambulatory Visit (INDEPENDENT_AMBULATORY_CARE_PROVIDER_SITE_OTHER): Payer: Medicare Other

## 2013-10-24 ENCOUNTER — Ambulatory Visit (INDEPENDENT_AMBULATORY_CARE_PROVIDER_SITE_OTHER): Payer: Medicare Other | Admitting: Family Medicine

## 2013-10-24 ENCOUNTER — Other Ambulatory Visit: Payer: Medicare Other

## 2013-10-24 DIAGNOSIS — E039 Hypothyroidism, unspecified: Secondary | ICD-10-CM

## 2013-10-24 DIAGNOSIS — E538 Deficiency of other specified B group vitamins: Secondary | ICD-10-CM

## 2013-10-24 LAB — VITAMIN B12: Vitamin B-12: >1500 pg/mL — ABNORMAL HIGH (ref 211–911)

## 2013-10-24 LAB — TSH: TSH: 3.75 u[IU]/mL (ref 0.35–4.50)

## 2013-10-24 LAB — T4, FREE: Free T4: 0.68 ng/dL (ref 0.60–1.60)

## 2013-10-24 MED ORDER — CYANOCOBALAMIN 1000 MCG/ML IJ SOLN
1000.0000 ug | Freq: Once | INTRAMUSCULAR | Status: AC
  Administered 2013-10-24: 1000 ug via INTRAMUSCULAR

## 2013-10-25 ENCOUNTER — Other Ambulatory Visit: Payer: Self-pay | Admitting: Family Medicine

## 2013-10-25 DIAGNOSIS — E538 Deficiency of other specified B group vitamins: Secondary | ICD-10-CM

## 2013-11-04 ENCOUNTER — Other Ambulatory Visit: Payer: Self-pay | Admitting: Neurosurgery

## 2013-11-04 DIAGNOSIS — M5412 Radiculopathy, cervical region: Secondary | ICD-10-CM

## 2013-11-12 ENCOUNTER — Ambulatory Visit
Admission: RE | Admit: 2013-11-12 | Discharge: 2013-11-12 | Disposition: A | Discharge status: Home / Self Care | Payer: 59 | Source: Ambulatory Visit | Attending: Neurosurgery | Admitting: Neurosurgery

## 2013-11-12 VITALS — BP 101/54 | HR 65

## 2013-11-12 DIAGNOSIS — M5412 Radiculopathy, cervical region: Secondary | ICD-10-CM

## 2013-11-12 DIAGNOSIS — M47812 Spondylosis without myelopathy or radiculopathy, cervical region: Secondary | ICD-10-CM

## 2013-11-12 MED ORDER — DIAZEPAM 5 MG PO TABS
10.0000 mg | ORAL_TABLET | Freq: Once | ORAL | Status: AC
Start: 2013-11-12 — End: 2013-11-12
  Administered 2013-11-12: 10 mg via ORAL

## 2013-11-12 MED ORDER — ONDANSETRON HCL 4 MG/2ML IJ SOLN
4.0000 mg | Freq: Once | INTRAMUSCULAR | Status: AC
  Administered 2013-11-12: 4 mg via INTRAMUSCULAR

## 2013-11-12 MED ORDER — HYDROMORPHONE HCL PF 1 MG/ML IJ SOLN
1.0000 mg | Freq: Once | INTRAMUSCULAR | Status: AC
  Administered 2013-11-12: 1 mg via INTRAMUSCULAR

## 2013-11-12 MED ORDER — IOHEXOL 300 MG/ML  SOLN
10.0000 mL | Freq: Once | INTRAMUSCULAR | Status: AC | PRN
  Administered 2013-11-12: 10 mL via INTRATHECAL

## 2013-11-12 NOTE — Discharge Instructions (Signed)
Myelogram Discharge Instructions  1. Go home and rest quietly for the next 24 hours.  It is important to lie flat for the next 24 hours.  Get up only to go to the restroom.  You may lie in the bed or on a couch on your back, your stomach, your left side or your right side.  You may have one pillow under your head.  You may have pillows between your knees while you are on your side or under your knees while you are on your back.  2. DO NOT drive today.  Recline the seat as far back as it will go, while still wearing your seat belt, on the way home.  3. You may get up to go to the bathroom as needed.  You may sit up for 10 minutes to eat.  You may resume your normal diet and medications unless otherwise indicated.  Drink lots of extra fluids today and tomorrow.  4. The incidence of headache, nausea, or vomiting is about 5% (one in 20 patients).  If you develop a headache, lie flat and drink plenty of fluids until the headache goes away.  Caffeinated beverages may be helpful.  If you develop severe nausea and vomiting or a headache that does not go away with flat bed rest, call [phone].  5. You may resume normal activities after your 24 hours of bed rest is over; however, do not exert yourself strongly or do any heavy lifting tomorrow. If when you get up you have a headache when standing, go back to bed and force fluids for another 24 hours.  6. Call your physician for a follow-up appointment.  The results of your myelogram will be sent directly to your physician by the following day.  7. If you have any questions or if complications develop after you arrive home, please call [phone].  Discharge instructions have been explained to the patient.  The patient, or the person responsible for the patient, fully understands these instructions.      May resume Viibryd and Wellbutrin on Aug. 26, 2015, after 11:00 am.

## 2013-11-12 NOTE — Progress Notes (Signed)
Patient states she has been off Viibryd and Wellbutrin for at least the past two days.  jkl

## 2013-11-13 ENCOUNTER — Telehealth: Payer: Self-pay | Admitting: Radiology

## 2013-11-13 NOTE — Telephone Encounter (Signed)
Dr. Gerilyn Pilgrim called about 9 am this morning. He had talked to this myelo pt yesterday evening and wanted me to check on her this morning. Pt called c/o itching, she took benadryl with good relief. Still has some itching this am but was better than last night. Encouraged her to take the benadryl every 6 hours if stilling itching. Possible that the dilaudid she received for pain was the cause, other pain meds also make her itch.

## 2013-11-28 ENCOUNTER — Telehealth: Payer: Self-pay | Admitting: Family Medicine

## 2013-11-28 MED ORDER — ALPRAZOLAM 1 MG PO TABS: 1.0000 mg | ORAL_TABLET | Freq: Three times a day (TID) | ORAL | Status: DC | PRN

## 2013-11-28 NOTE — Telephone Encounter (Signed)
Last visit 08/23/13 Last refill 08/21/13 #90 2  refill

## 2013-11-28 NOTE — Telephone Encounter (Signed)
Pt needs refill on alprazolam call into [facility]

## 2013-11-28 NOTE — Telephone Encounter (Signed)
Refill for three months.  Try tapering back, if possible.

## 2013-11-28 NOTE — Telephone Encounter (Signed)
Called in pharmacy 

## 2013-12-23 ENCOUNTER — Telehealth: Payer: Self-pay | Admitting: Family Medicine

## 2013-12-23 NOTE — Telephone Encounter (Signed)
Has she had prior sleep study?  Any dx of OSA?  What are her symptoms?  Probably needs follow up to discuss

## 2013-12-23 NOTE — Telephone Encounter (Signed)
Was Dr Joya Salm aware of sleep study 2013?  Not sure insurance will cover again this soon.

## 2013-12-23 NOTE — Telephone Encounter (Signed)
Pt stated that she does not have the flexibility in her neck and she has more bulging in her neck. Pt is not getting enough sleep at night. Pt does have a sleep study report in her chart back 2013.

## 2013-12-23 NOTE — Telephone Encounter (Signed)
Pt needs referral for a new sleep study. The neuro md has found more bulging in her neck. Dr. Leeroy Cha  tried to put in the referal but the insurance denied. Must come for PCP. Pt would like to go through [facility] if possible.

## 2013-12-24 NOTE — Telephone Encounter (Signed)
Pt stated that Dr. Joya Salm is aware. And that they recommend her to have another done.

## 2013-12-24 NOTE — Telephone Encounter (Signed)
OK to set up sleep study.

## 2013-12-25 ENCOUNTER — Other Ambulatory Visit: Payer: Self-pay | Admitting: Family Medicine

## 2013-12-25 DIAGNOSIS — G4733 Obstructive sleep apnea (adult) (pediatric): Secondary | ICD-10-CM

## 2013-12-25 DIAGNOSIS — M542 Cervicalgia: Secondary | ICD-10-CM

## 2013-12-25 NOTE — Telephone Encounter (Signed)
Referral is ordered

## 2013-12-27 ENCOUNTER — Other Ambulatory Visit: Payer: Self-pay | Admitting: Family Medicine

## 2014-01-10 ENCOUNTER — Telehealth: Payer: Self-pay | Admitting: Family Medicine

## 2014-01-10 NOTE — Telephone Encounter (Signed)
Already done

## 2014-01-10 NOTE — Telephone Encounter (Signed)
Pt says that Taylor Ritter in [place] faxed [place] paperwork Wednesday that needed to be signed and sent back to set the patient up on a machine. Patient wants to know if we have seen it or signed it and sent it in.

## 2014-02-24 ENCOUNTER — Other Ambulatory Visit: Payer: Self-pay | Admitting: Family Medicine

## 2014-02-24 NOTE — Telephone Encounter (Signed)
Last visit 08/23/13 Last refill 11/28/13 #90 2 refill

## 2014-02-24 NOTE — Telephone Encounter (Signed)
Refill for 3 months. 

## 2014-03-24 ENCOUNTER — Other Ambulatory Visit: Payer: Self-pay | Admitting: Family Medicine

## 2014-03-26 ENCOUNTER — Other Ambulatory Visit: Payer: Self-pay | Admitting: Family Medicine

## 2014-03-26 ENCOUNTER — Other Ambulatory Visit (INDEPENDENT_AMBULATORY_CARE_PROVIDER_SITE_OTHER): Payer: Medicare Other

## 2014-03-26 ENCOUNTER — Other Ambulatory Visit: Payer: Self-pay

## 2014-03-26 DIAGNOSIS — E538 Deficiency of other specified B group vitamins: Secondary | ICD-10-CM

## 2014-03-26 MED ORDER — BUPROPION HCL ER (XL) 150 MG PO TB24: 150.0000 mg | ORAL_TABLET | Freq: Every day | ORAL | Status: DC

## 2014-03-26 MED ORDER — VALACYCLOVIR HCL 500 MG PO TABS: ORAL_TABLET | ORAL | Status: DC

## 2014-03-26 MED ORDER — HYDROCHLOROTHIAZIDE 25 MG PO TABS: ORAL_TABLET | ORAL | Status: DC

## 2014-03-26 MED ORDER — VILAZODONE HCL 40 MG PO TABS: 40.0000 mg | ORAL_TABLET | Freq: Every day | ORAL | Status: DC

## 2014-03-26 MED ORDER — CYCLOBENZAPRINE HCL 10 MG PO TABS: 10.0000 mg | ORAL_TABLET | Freq: Three times a day (TID) | ORAL | Status: DC | PRN

## 2014-03-27 LAB — HEMOGLOBIN A1C: HEMOGLOBIN A1C: 6 % (ref 4.6–6.5)

## 2014-03-27 LAB — VITAMIN B12: Vitamin B-12: 519 pg/mL (ref 211–911)

## 2014-03-28 ENCOUNTER — Telehealth: Payer: Self-pay

## 2014-03-28 NOTE — Telephone Encounter (Signed)
Pt wants to get Hemoglobin checked. Pt states that she is always cold.

## 2014-03-29 NOTE — Telephone Encounter (Signed)
We can check hgb but this would not likely explain her cold intolerance.

## 2014-03-31 ENCOUNTER — Other Ambulatory Visit: Payer: Self-pay | Admitting: Family Medicine

## 2014-03-31 DIAGNOSIS — I10 Essential (primary) hypertension: Secondary | ICD-10-CM

## 2014-03-31 NOTE — Telephone Encounter (Signed)
Can you please call patient for lab appt. Lab is ordered.

## 2014-04-01 NOTE — Telephone Encounter (Signed)
Done

## 2014-04-02 ENCOUNTER — Other Ambulatory Visit (INDEPENDENT_AMBULATORY_CARE_PROVIDER_SITE_OTHER): Payer: Medicare Other

## 2014-04-02 DIAGNOSIS — E538 Deficiency of other specified B group vitamins: Secondary | ICD-10-CM

## 2014-04-02 DIAGNOSIS — I10 Essential (primary) hypertension: Secondary | ICD-10-CM

## 2014-04-02 LAB — HEMOGLOBIN: Hemoglobin: 12.6 g/dL (ref 12.0–15.0)

## 2014-04-15 ENCOUNTER — Telehealth: Payer: Self-pay | Admitting: Family Medicine

## 2014-04-15 NOTE — Telephone Encounter (Signed)
Informed patient per lab results that B12 was normal and to just take the oral supplement. Pt states that she is tired and her legs are hurting her.

## 2014-04-15 NOTE — Telephone Encounter (Signed)
Pt would like for you to  ask Dr Elease Hashimoto if she can start her B12 injections again

## 2014-04-16 NOTE — Telephone Encounter (Signed)
Would not administer IM B12 since her levels are normal.

## 2014-05-07 ENCOUNTER — Telehealth: Payer: Self-pay | Admitting: Family Medicine

## 2014-05-07 MED ORDER — BUPROPION HCL ER (XL) 150 MG PO TB24: 150.0000 mg | ORAL_TABLET | Freq: Every day | ORAL | Status: DC

## 2014-05-07 NOTE — Telephone Encounter (Signed)
Patient states [facility] is advising her that she doesn't have buPROPion (WELLBUTRIN XL) 150 MG 24 hr tablet  On file for her to fill.  They need another RX sent.  [place], [place] - [address]

## 2014-05-07 NOTE — Telephone Encounter (Signed)
Rx sent to pharmacy   

## 2014-05-12 ENCOUNTER — Other Ambulatory Visit: Payer: Self-pay

## 2014-05-12 MED ORDER — BUPROPION HCL ER (XL) 150 MG PO TB24: 150.0000 mg | ORAL_TABLET | Freq: Three times a day (TID) | ORAL | Status: DC

## 2014-05-13 ENCOUNTER — Other Ambulatory Visit: Payer: Self-pay

## 2014-05-20 ENCOUNTER — Other Ambulatory Visit: Payer: Self-pay | Admitting: Family Medicine

## 2014-05-21 NOTE — Telephone Encounter (Signed)
Last seen 08/23/13  Last refilled 02/25/14 #90 with 2 refills

## 2014-05-21 NOTE — Telephone Encounter (Signed)
May refill by March 7th for 3 months.

## 2014-05-30 ENCOUNTER — Telehealth: Payer: Self-pay | Admitting: Family Medicine

## 2014-05-30 MED ORDER — BUPROPION HCL ER (XL) 150 MG PO TB24: ORAL_TABLET | ORAL | Status: DC

## 2014-05-30 NOTE — Telephone Encounter (Signed)
If patient is taking the 24hr she is suppose to be taking 1 tablet. Is that correct?

## 2014-05-30 NOTE — Telephone Encounter (Signed)
Pt states her rx buPROPion (WELLBUTRIN XL) 150 MG 24 hr tablet is still not the correct.amount.  [facility] shipped 1/ day  and pt refused Pt reicieved only 1 /day 150 mg. Pt is on 3 tabs/day for 150 mg. Pt is now out, so in addition to sending 90 day, buPROPion (WELLBUTRIN XL) 150 MG 24 hr tablet to [facility], She would like a 30 day (180) sent to [facility]

## 2014-05-30 NOTE — Telephone Encounter (Signed)
I'm not sure how this got changed on med list to once daily.  When looking back on old refills she clearly got 3 per day We need to clarify but if she has been taking 3 daily we need to correct on med list and send in correct amount.

## 2014-05-30 NOTE — Telephone Encounter (Signed)
Pt states that she does take 3 tablets at once. Rx sent to local pharmacy and mail order.

## 2014-08-21 ENCOUNTER — Other Ambulatory Visit: Payer: Self-pay | Admitting: Family Medicine

## 2014-08-21 NOTE — Telephone Encounter (Signed)
Last visit 05/21/14 Last refill 08/23/13 #90 2 refill

## 2014-08-21 NOTE — Telephone Encounter (Signed)
Refill OK

## 2014-09-24 ENCOUNTER — Other Ambulatory Visit: Payer: Self-pay | Admitting: Family Medicine

## 2014-09-24 ENCOUNTER — Telehealth: Payer: Self-pay | Admitting: Family Medicine

## 2014-09-24 NOTE — Telephone Encounter (Signed)
Pt has sch an appt on 10-03-14. Pt only want alprazolam call into [facility]. Pt will wait for other rxs

## 2014-09-24 NOTE — Telephone Encounter (Signed)
Patient needs office visit has not been seen since 08/23/13. I can do #30 to a local pharmacy.

## 2014-09-24 NOTE — Telephone Encounter (Signed)
Pt needs refill on nexium 40 mg#180, wellbutrin xl 150 mg#90, hctz 25 mg#90 and valtrex 500 mg #180 sent with refills to [facility]. Pt also needs alprazolam call into [facility] [place]

## 2014-09-25 MED ORDER — ALPRAZOLAM 1 MG PO TABS: ORAL_TABLET | ORAL | Status: DC

## 2014-09-25 NOTE — Telephone Encounter (Signed)
Refill once 

## 2014-09-25 NOTE — Telephone Encounter (Signed)
Last refill 08/22/14 #90 0 refill

## 2014-09-25 NOTE — Telephone Encounter (Signed)
RX called into pharmacy

## 2014-10-03 ENCOUNTER — Encounter: Payer: Self-pay | Admitting: Family Medicine

## 2014-10-03 ENCOUNTER — Other Ambulatory Visit: Payer: Self-pay

## 2014-10-03 ENCOUNTER — Ambulatory Visit (INDEPENDENT_AMBULATORY_CARE_PROVIDER_SITE_OTHER): Payer: Medicare Other | Admitting: Family Medicine

## 2014-10-03 VITALS — BP 124/86 | HR 81 | Temp 98.1°F | Ht 66.0 in | Wt 260.2 lb

## 2014-10-03 DIAGNOSIS — B001 Herpesviral vesicular dermatitis: Secondary | ICD-10-CM

## 2014-10-03 DIAGNOSIS — K219 Gastro-esophageal reflux disease without esophagitis: Secondary | ICD-10-CM

## 2014-10-03 DIAGNOSIS — E538 Deficiency of other specified B group vitamins: Secondary | ICD-10-CM

## 2014-10-03 DIAGNOSIS — F329 Major depressive disorder, single episode, unspecified: Secondary | ICD-10-CM

## 2014-10-03 DIAGNOSIS — F32A Depression, unspecified: Secondary | ICD-10-CM

## 2014-10-03 DIAGNOSIS — Z1231 Encounter for screening mammogram for malignant neoplasm of breast: Secondary | ICD-10-CM

## 2014-10-03 MED ORDER — BUPROPION HCL ER (XL) 150 MG PO TB24: ORAL_TABLET | ORAL | Status: DC

## 2014-10-03 MED ORDER — VALACYCLOVIR HCL 500 MG PO TABS: ORAL_TABLET | ORAL | Status: DC

## 2014-10-03 MED ORDER — ESOMEPRAZOLE MAGNESIUM 40 MG PO CPDR: 40.0000 mg | DELAYED_RELEASE_CAPSULE | Freq: Two times a day (BID) | ORAL | Status: DC

## 2014-10-03 MED ORDER — VILAZODONE HCL 40 MG PO TABS: 40.0000 mg | ORAL_TABLET | Freq: Every day | ORAL | Status: DC

## 2014-10-03 NOTE — Progress Notes (Signed)
Subjective:    Patient ID: Taylor Ritter, female    DOB: [birth year], 54 y.o.   MRN: [id number]  HPI Medical follow-up. Chronic medical problems include history of obesity, chronic back pain followed by pain management, recurrent depression, history of GERD with esophageal stricture, asthma, hypertension. She's been on regimen of Wellbutrin and Vibrid for depression for quite some time and it seems to be working well for her. Need refills of those medications  GERD. Stable on Nexium. Occasional breakthrough symptoms. No recent dysphagia. No appetite or weight changes. Requesting refills  Recurrent cold sores. She takes regular Valtrex. We reviewed regimen of 2 g 12 hours apart for acute flareups but she wishes to stay on prophylaxis  B12 deficiency. Probably related to chronic PPI use. She is currently not taking any oral replacement is encouraged back on this  Past Medical History  Diagnosis Date  . Hypertension     followed by [facility]  . Asthma     daily inhalers allegy shots.followed by [facility]  . Neuromuscular disorder     numbness arms legs,feet   . Fibromyalgia   . Arthritis     osteoporosis  . Depression   . Anxiety     uses xanax as needed   . Headache(784.0)   . Shortness of breath   . Complication of anesthesia   . Sleep apnea   . Chronic kidney disease     see's kidney doctor for urine retention  . GERD (gastroesophageal reflux disease)     ,hx of stricture   Past Surgical History  Procedure Laterality Date  . Abdominal hysterectomy    . Cholecystectomy    . Knee arthroscopy      L KNEE  . Carpal tunnel release      R&L  . Anterior fusion cervical spine    . Lumbar laminectomy    . Esophageal dilation    . Cesarean section  1983  . Septoplasty  10+yrs ago  . Lumbar laminectomy/decompression microdiscectomy  02/25/2011    Procedure: LUMBAR LAMINECTOMY/DECOMPRESSION MICRODISCECTOMY;  Surgeon: Floyce Stakes;   Location: [place] [facility];  Service: Neurosurgery;  Laterality: N/A;  Lumbar Four-Five Microdiscectomy  . Back surgery  2012  . Lumbar wound debridement  03/18/2011    Procedure: LUMBAR WOUND DEBRIDEMENT;  Surgeon: Floyce Stakes;  Location: [place] [facility];  Service: Neurosurgery;  Laterality: N/A;  Exploration of Lumbar wound    reports that she has never smoked. She has never used smokeless tobacco. She reports that she does not drink alcohol or use illicit drugs. family history includes Arthritis in her father and mother; COPD in her mother; Cancer in her maternal grandfather and maternal grandmother; Diabetes in her father and maternal grandfather; Heart disease in her father; Heart disease (age of onset: 9) in her mother; Hypertension in her mother; Kidney disease in her mother; Mental illness in her mother. There is no history of Anesthesia problems, Hypotension, Malignant hyperthermia, or Pseudochol deficiency. Allergies  Allergen Reactions  . Other Other (See Comments)    Nylon sutures had to remove to allow healing  Vicryl sutures  . Percocet [Oxycodone-Acetaminophen] Other (See Comments)    "makes her crazy"  . Codeine Hives  . Dye Fdc Red [Red Dye] Hives    Scan dye, broke out in hives all over once with a neck scan  . Morphine And Related Hives and Itching  . Vesicare [Solifenacin Succinate] Itching      Review of  Systems  Constitutional: Negative for fatigue.  HENT: Negative for trouble swallowing.   Eyes: Negative for visual disturbance.  Respiratory: Negative for cough, chest tightness, shortness of breath and wheezing.   Cardiovascular: Negative for chest pain, palpitations and leg swelling.  Genitourinary: Negative for dysuria.  Musculoskeletal: Positive for back pain.  Neurological: Negative for dizziness, seizures, syncope, weakness, light-headedness and headaches.  Psychiatric/Behavioral: Negative for suicidal ideas and dysphoric mood.       Objective:    Physical Exam  Constitutional: She appears well-developed and well-nourished. No distress.  Neck: Neck supple. No thyromegaly present.  Cardiovascular: Normal rate and regular rhythm.   Pulmonary/Chest: Effort normal and breath sounds normal. No respiratory distress. She has no wheezes. She has no rales.  Musculoskeletal: She exhibits no edema.  Lymphadenopathy:    She has no cervical adenopathy.  Psychiatric: She has a normal mood and affect. Her behavior is normal.          Assessment & Plan:  #1 recurrent depression. Stable. Refill medications for one year #2 chronic GERD. Stable on Nexium. Refills given for one year #3 recurrent cold sores. Refill Valtrex for prophylaxis-per her request. We reviewed regimen for acute treatment as an alternative but she wishes to stay on prophylaxis #4 history of B12 deficiency probably related to chronic PPI use. She is strongly encouraged to get back on over-the-counter oral replacement 500 micro-grams once daily and recheck B12 level follow-up in 6 months

## 2014-10-03 NOTE — Progress Notes (Signed)
Pre visit review using our clinic review tool, if applicable. No additional management support is needed unless otherwise documented below in the visit note. 

## 2014-10-03 NOTE — Patient Instructions (Signed)
Get back on daily OTC B12 500 mcg daily.

## 2014-10-26 ENCOUNTER — Other Ambulatory Visit: Payer: Self-pay | Admitting: Family Medicine

## 2014-10-27 NOTE — Telephone Encounter (Signed)
Last visit 10/03/14 Last refill 09/25/14 #90 0 refill

## 2014-10-28 NOTE — Telephone Encounter (Signed)
Refill with 3 additional refills   

## 2014-11-07 ENCOUNTER — Ambulatory Visit
Admission: RE | Admit: 2014-11-07 | Discharge: 2014-11-07 | Disposition: A | Discharge status: Home / Self Care | Payer: Medicare Other | Source: Ambulatory Visit

## 2014-11-07 DIAGNOSIS — Z1231 Encounter for screening mammogram for malignant neoplasm of breast: Secondary | ICD-10-CM

## 2014-11-17 ENCOUNTER — Encounter: Payer: Self-pay | Admitting: Family Medicine

## 2014-12-22 ENCOUNTER — Telehealth: Payer: Self-pay | Admitting: Family Medicine

## 2014-12-22 NOTE — Telephone Encounter (Signed)
Pt said the last 2 times she has gotten the following med refill it has been for 30 days ALPRAZolam (XANAX) 1 MG tablet  Pt said it is usually for 90 days and is asking why the change from 90 pills

## 2014-12-23 MED ORDER — ALPRAZOLAM 1 MG PO TABS: 1.0000 mg | ORAL_TABLET | Freq: Three times a day (TID) | ORAL | Status: DC | PRN

## 2014-12-23 NOTE — Addendum Note (Signed)
Addended by: Ailene Rud E on: 12/23/2014 10:34 AM   Modules accepted: Orders

## 2014-12-23 NOTE — Telephone Encounter (Signed)
Last Rx refill was 10/28/14 #90 i dont know why pt said she only got 30 pills. Pt last visit 10/03/14. i will contact the pharmacy to make sure

## 2014-12-23 NOTE — Telephone Encounter (Signed)
Refill for 3 months. 

## 2014-12-23 NOTE — Telephone Encounter (Signed)
Rx was call in  

## 2015-03-06 ENCOUNTER — Other Ambulatory Visit: Payer: Self-pay | Admitting: Family Medicine

## 2015-04-06 ENCOUNTER — Ambulatory Visit: Payer: Medicare Other | Admitting: Family Medicine

## 2015-04-06 ENCOUNTER — Ambulatory Visit (INDEPENDENT_AMBULATORY_CARE_PROVIDER_SITE_OTHER): Payer: Medicare Other | Admitting: Family Medicine

## 2015-04-06 ENCOUNTER — Encounter: Payer: Self-pay | Admitting: Family Medicine

## 2015-04-06 VITALS — BP 90/70 | HR 99 | Temp 98.1°F | Ht 66.0 in | Wt 244.7 lb

## 2015-04-06 DIAGNOSIS — E538 Deficiency of other specified B group vitamins: Secondary | ICD-10-CM

## 2015-04-06 DIAGNOSIS — Z0000 Encounter for general adult medical examination without abnormal findings: Secondary | ICD-10-CM | POA: Diagnosis not present

## 2015-04-06 DIAGNOSIS — I10 Essential (primary) hypertension: Secondary | ICD-10-CM

## 2015-04-06 LAB — CBC WITH DIFFERENTIAL/PLATELET
Basophils Absolute: 0 10*3/uL (ref 0.0–0.1)
Basophils Relative: 0.6 % (ref 0.0–3.0)
EOS PCT: 2.1 % (ref 0.0–5.0)
Eosinophils Absolute: 0.1 10*3/uL (ref 0.0–0.7)
HCT: 36.8 % (ref 36.0–46.0)
Hemoglobin: 12.2 g/dL (ref 12.0–15.0)
LYMPHS ABS: 1.9 10*3/uL (ref 0.7–4.0)
Lymphocytes Relative: 30.2 % (ref 12.0–46.0)
MCHC: 33.1 g/dL (ref 30.0–36.0)
MCV: 90.3 fl (ref 78.0–100.0)
MONOS PCT: 6 % (ref 3.0–12.0)
Monocytes Absolute: 0.4 10*3/uL (ref 0.1–1.0)
NEUTROS ABS: 3.9 10*3/uL (ref 1.4–7.7)
NEUTROS PCT: 61.1 % (ref 43.0–77.0)
PLATELETS: 238 10*3/uL (ref 150.0–400.0)
RBC: 4.07 Mil/uL (ref 3.87–5.11)
RDW: 13.1 % (ref 11.5–15.5)
WBC: 6.3 10*3/uL (ref 4.0–10.5)

## 2015-04-06 LAB — HEPATIC FUNCTION PANEL
ALK PHOS: 102 U/L (ref 39–117)
ALT: 11 U/L (ref 0–35)
AST: 14 U/L (ref 0–37)
Albumin: 4 g/dL (ref 3.5–5.2)
BILIRUBIN DIRECT: 0.1 mg/dL (ref 0.0–0.3)
BILIRUBIN TOTAL: 0.4 mg/dL (ref 0.2–1.2)
TOTAL PROTEIN: 7.4 g/dL (ref 6.0–8.3)

## 2015-04-06 LAB — LIPID PANEL
CHOL/HDL RATIO: 4
Cholesterol: 214 mg/dL — ABNORMAL HIGH (ref 0–200)
HDL: 50.9 mg/dL (ref >39.00)
LDL Cholesterol: 133 mg/dL — ABNORMAL HIGH (ref 0–99)
NonHDL: 163.06
TRIGLYCERIDES: 148 mg/dL (ref 0.0–149.0)
VLDL: 29.6 mg/dL (ref 0.0–40.0)

## 2015-04-06 LAB — BASIC METABOLIC PANEL
BUN: 22 mg/dL (ref 6–23)
CALCIUM: 9.2 mg/dL (ref 8.4–10.5)
CO2: 24 meq/L (ref 19–32)
Chloride: 107 mEq/L (ref 96–112)
Creatinine, Ser: 1.05 mg/dL (ref 0.40–1.20)
GFR: 57.73 mL/min — AB (ref >60.00)
Glucose, Bld: 83 mg/dL (ref 70–99)
POTASSIUM: 3.8 meq/L (ref 3.5–5.1)
SODIUM: 144 meq/L (ref 135–145)

## 2015-04-06 LAB — VITAMIN B12: VITAMIN B 12: 263 pg/mL (ref 211–911)

## 2015-04-06 LAB — HEMOGLOBIN A1C: HEMOGLOBIN A1C: 5.8 % (ref 4.6–6.5)

## 2015-04-06 LAB — TSH: TSH: 2.85 u[IU]/mL (ref 0.35–4.50)

## 2015-04-06 NOTE — Progress Notes (Signed)
Pre visit review using our clinic review tool, if applicable. No additional management support is needed unless otherwise documented below in the visit note. 

## 2015-04-06 NOTE — Progress Notes (Signed)
Subjective:    Patient ID: Taylor Ritter, female    DOB: June 10, 1960, 55 y.o.   MRN: [id number]  HPI  patient here for complete physical. She has lost some weight during this past year due to her efforts. She has restricted colas and bread intake. She's had previous hysterectomy for benign disease. She thinks she had flu vaccine per her allergist. She is followed by multiple specialists including allergist, pain management, and psychiatry. She has chronic cervical and lumbar back pain.   Very little exercise. Nonsmoker.  Colonoscopy and mammogram up-to-date.  Past Medical History  Diagnosis Date  . Hypertension     followed by [facility]  . Asthma     daily inhalers allegy shots.followed by [facility]  . Neuromuscular disorder (HCC)     numbness arms legs,feet   . Fibromyalgia   . Arthritis     osteoporosis  . Depression   . Anxiety     uses xanax as needed   . Headache(784.0)   . Shortness of breath   . Complication of anesthesia   . Sleep apnea   . Chronic kidney disease     see's kidney doctor for urine retention  . GERD (gastroesophageal reflux disease)     ,hx of stricture   Past Surgical History  Procedure Laterality Date  . Abdominal hysterectomy    . Cholecystectomy    . Knee arthroscopy      L KNEE  . Carpal tunnel release      R&L  . Anterior fusion cervical spine    . Lumbar laminectomy    . Esophageal dilation    . Cesarean section  1983  . Septoplasty  10+yrs ago  . Lumbar laminectomy/decompression microdiscectomy  02/25/2011    Procedure: LUMBAR LAMINECTOMY/DECOMPRESSION MICRODISCECTOMY;  Surgeon: Floyce Stakes;  Location: [place] [facility];  Service: Neurosurgery;  Laterality: N/A;  Lumbar Four-Five Microdiscectomy  . Back surgery  2012  . Lumbar wound debridement  03/18/2011    Procedure: LUMBAR WOUND DEBRIDEMENT;  Surgeon: Floyce Stakes;  Location: [place] [facility];  Service: Neurosurgery;  Laterality: N/A;  Exploration  of Lumbar wound    reports that she has never smoked. She has never used smokeless tobacco. She reports that she does not drink alcohol or use illicit drugs. family history includes Arthritis in her father and mother; COPD in her mother; Cancer in her father, maternal grandfather, and maternal grandmother; Diabetes in her father, maternal grandfather, and mother; Early death in her brother; Heart disease in her father; Heart disease (age of onset: 33) in her mother; Hypertension in her mother; Kidney disease in her mother; Mental illness in her father and mother. There is no history of Anesthesia problems, Hypotension, Malignant hyperthermia, or Pseudochol deficiency. Allergies  Allergen Reactions  . Other Other (See Comments)    Nylon sutures had to remove to allow healing  Vicryl sutures  . Percocet [Oxycodone-Acetaminophen] Other (See Comments)    "makes her crazy"  . Codeine Hives  . Dye Fdc Red [Red Dye] Hives    Scan dye, broke out in hives all over once with a neck scan  . Morphine And Related Hives and Itching  . Vesicare [Solifenacin Succinate] Itching      Review of Systems  Constitutional: Positive for fatigue. Negative for fever, activity change, appetite change and unexpected weight change.  HENT: Negative for ear pain, hearing loss, sore throat and trouble swallowing.   Eyes: Negative for visual disturbance.  Respiratory: Negative for cough and shortness of breath.   Cardiovascular: Negative for chest pain, palpitations and leg swelling.  Gastrointestinal: Negative for abdominal pain, diarrhea, constipation and blood in stool.  Endocrine: Negative for polydipsia and polyuria.  Genitourinary: Negative for dysuria and hematuria.  Musculoskeletal: Positive for back pain and neck pain. Negative for myalgias and arthralgias.  Skin: Negative for rash.  Neurological: Negative for dizziness, syncope and headaches.  Hematological: Negative for adenopathy.    Psychiatric/Behavioral: Negative for confusion and dysphoric mood.       Objective:   Physical Exam  Constitutional: She is oriented to person, place, and time. She appears well-developed and well-nourished.  HENT:  Head: Normocephalic and atraumatic.  Eyes: EOM are normal. Pupils are equal, round, and reactive to light.  Neck: Normal range of motion. Neck supple. No thyromegaly present.  Cardiovascular: Normal rate, regular rhythm and normal heart sounds.   No murmur heard. Pulmonary/Chest: Breath sounds normal. No respiratory distress. She has no wheezes. She has no rales.  Abdominal: Soft. Bowel sounds are normal. She exhibits no distension and no mass. There is no tenderness. There is no rebound and no guarding.  Genitourinary:  No indication for Pap smear  Musculoskeletal: Normal range of motion. She exhibits no edema.  Lymphadenopathy:    She has no cervical adenopathy.  Neurological: She is alert and oriented to person, place, and time. She displays normal reflexes. No cranial nerve deficit.  Skin: No rash noted.  Psychiatric: She has a normal mood and affect. Her behavior is normal. Judgment and thought content normal.          Assessment & Plan:  Physical exam. Obtain screening labs. Add B12 with prior history of replacement. She is currently not taking any replacement therapy. Include hemoglobin A1c as both parents have type 2 diabetes. Confirm whether she's had flu vaccine. Other screenings are up-to-date.

## 2015-04-06 NOTE — Patient Instructions (Signed)

## 2015-04-07 ENCOUNTER — Other Ambulatory Visit: Payer: Self-pay | Admitting: Family Medicine

## 2015-04-07 MED ORDER — VITAMIN B-12 1000 MCG PO TABS: 1000.0000 ug | ORAL_TABLET | Freq: Every day | ORAL | Status: DC

## 2015-05-19 ENCOUNTER — Other Ambulatory Visit: Payer: Self-pay | Admitting: Family Medicine

## 2015-05-19 NOTE — Telephone Encounter (Signed)
Refill with 3 additional refills.  Try to scale back, if possible.

## 2015-05-19 NOTE — Telephone Encounter (Signed)
Last seen on 04-06-2015 Last filled 12/23/2014 #90, 3rf Please advise on refill

## 2015-05-20 ENCOUNTER — Emergency Department (HOSPITAL_BASED_OUTPATIENT_CLINIC_OR_DEPARTMENT_OTHER)
Admission: EM | Admit: 2015-05-20 | Discharge: 2015-05-20 | Disposition: A | Discharge status: Home / Self Care | Payer: Medicare Other | Attending: Emergency Medicine | Admitting: Emergency Medicine

## 2015-05-20 ENCOUNTER — Encounter (HOSPITAL_BASED_OUTPATIENT_CLINIC_OR_DEPARTMENT_OTHER): Payer: Self-pay

## 2015-05-20 ENCOUNTER — Emergency Department (HOSPITAL_BASED_OUTPATIENT_CLINIC_OR_DEPARTMENT_OTHER): Payer: Medicare Other

## 2015-05-20 ENCOUNTER — Emergency Department (HOSPITAL_COMMUNITY): Payer: Medicare Other

## 2015-05-20 DIAGNOSIS — R42 Dizziness and giddiness: Secondary | ICD-10-CM | POA: Insufficient documentation

## 2015-05-20 DIAGNOSIS — I129 Hypertensive chronic kidney disease with stage 1 through stage 4 chronic kidney disease, or unspecified chronic kidney disease: Secondary | ICD-10-CM | POA: Insufficient documentation

## 2015-05-20 DIAGNOSIS — N189 Chronic kidney disease, unspecified: Secondary | ICD-10-CM | POA: Diagnosis not present

## 2015-05-20 LAB — COMPREHENSIVE METABOLIC PANEL
ALBUMIN: 3.8 g/dL (ref 3.5–5.0)
ALT: 12 U/L — AB (ref 14–54)
AST: 18 U/L (ref 15–41)
Alkaline Phosphatase: 87 U/L (ref 38–126)
Anion gap: 9 (ref 5–15)
BILIRUBIN TOTAL: 0.5 mg/dL (ref 0.3–1.2)
BUN: 15 mg/dL (ref 6–20)
CO2: 24 mmol/L (ref 22–32)
CREATININE: 1.07 mg/dL — AB (ref 0.44–1.00)
Calcium: 8.5 mg/dL — ABNORMAL LOW (ref 8.9–10.3)
Chloride: 109 mmol/L (ref 101–111)
GFR calc Af Amer: >60 mL/min (ref >60)
GFR, EST NON AFRICAN AMERICAN: 57 mL/min — AB (ref >60)
GLUCOSE: 100 mg/dL — AB (ref 65–99)
POTASSIUM: 3.6 mmol/L (ref 3.5–5.1)
Sodium: 142 mmol/L (ref 135–145)
TOTAL PROTEIN: 7 g/dL (ref 6.5–8.1)

## 2015-05-20 LAB — CBC WITH DIFFERENTIAL/PLATELET
BASOS ABS: 0 10*3/uL (ref 0.0–0.1)
BASOS PCT: 0 %
Eosinophils Absolute: 0.2 10*3/uL (ref 0.0–0.7)
Eosinophils Relative: 3 %
HEMATOCRIT: 34.7 % — AB (ref 36.0–46.0)
HEMOGLOBIN: 11.3 g/dL — AB (ref 12.0–15.0)
LYMPHS PCT: 33 %
Lymphs Abs: 2.1 10*3/uL (ref 0.7–4.0)
MCH: 30.5 pg (ref 26.0–34.0)
MCHC: 32.6 g/dL (ref 30.0–36.0)
MCV: 93.8 fL (ref 78.0–100.0)
Monocytes Absolute: 0.4 10*3/uL (ref 0.1–1.0)
Monocytes Relative: 7 %
NEUTROS ABS: 3.6 10*3/uL (ref 1.7–7.7)
NEUTROS PCT: 57 %
Platelets: 245 10*3/uL (ref 150–400)
RBC: 3.7 MIL/uL — AB (ref 3.87–5.11)
RDW: 13 % (ref 11.5–15.5)
WBC: 6.3 10*3/uL (ref 4.0–10.5)

## 2015-05-20 MED ORDER — MECLIZINE HCL 25 MG PO TABS
25.0000 mg | ORAL_TABLET | Freq: Once | ORAL | Status: AC
  Administered 2015-05-20: 25 mg via ORAL
  Filled 2015-05-20: qty 1

## 2015-05-20 MED ORDER — LORAZEPAM 2 MG/ML IJ SOLN
1.0000 mg | Freq: Once | INTRAMUSCULAR | Status: AC
Start: 2015-05-20 — End: 2015-05-20
  Administered 2015-05-20: 1 mg via INTRAVENOUS
  Filled 2015-05-20: qty 1

## 2015-05-20 MED ORDER — SODIUM CHLORIDE 0.9 % IV SOLN: 1000.0000 mL | INTRAVENOUS | Status: DC

## 2015-05-20 MED ORDER — SODIUM CHLORIDE 0.9 % IV SOLN
1000.0000 mL | Freq: Once | INTRAVENOUS | Status: AC
  Administered 2015-05-20: 1000 mL via INTRAVENOUS

## 2015-05-20 MED ORDER — MECLIZINE HCL 25 MG PO TABS: 25.0000 mg | ORAL_TABLET | Freq: Three times a day (TID) | ORAL | Status: DC | PRN

## 2015-05-20 NOTE — Discharge Instructions (Signed)

## 2015-05-20 NOTE — ED Provider Notes (Signed)
History as reviewed with the patient again and reviewed from Dr. Venora Maples note. She does describe acute onset of vertiginous dizziness yesterday evening. The symptoms continued and today her daughter noted an appearance of drooping on the right side of her face. She has not had any focal extremity weakness numbness or tingling. She does continue to be off balance with walking. The quality of dizziness as spinning in nature. No double vision. He did not have any headache at time of onset. She reports this morning she had a mild posterior headache.  On exam the patient is alert and well in appearance. Neurologic examination is normal including bilateral finger-nose examination and bilateral heel shin exam. Her strength is intact, cognitive function is normal.  MRI has been obtained as discussed with Dr. Venora Maples. MRI does not show any aneurysm or stroke area. This time patient will be prescribed Antivert for vertiginous quality of dizziness. She is advised for follow-up both with her family physician as well as [facility].  Charlesetta Shanks, MD 05/20/15 2039

## 2015-05-20 NOTE — ED Notes (Signed)
Pt reports yesterday started having dizziness and unsteady on feet.  Reports felt like her bp was low at this time.  States she had HA, blurred vision and "felt drunk".  Reports daughter at one point stated that he face on R side was drooped. Currently symmetrical bilateral.

## 2015-05-20 NOTE — ED Provider Notes (Signed)
CSN: [id number]     Arrival date & time 05/20/15  1320 History   First MD Initiated Contact with Patient 05/20/15 1359     Chief Complaint  Patient presents with  . Dizziness      HPI Patient presents the emergency department with complaints of unsteady gait since yesterday.  This occurred suddenly.  She's felt off balance since then.  She reports "dizziness".  She denies significant vertiginous symptoms.  She went and saw her daughter today who is a nurse at an urgent care who is concerned that maybe the right side of her face was drooping.  She denies weakness of her arms or legs.  She continues to be off balance and thus was brought to the emergency department for evaluation.  No prior history of stroke.  She does have a history of hypertension.  Denies chest pain shortness of breath.  No neck pain.  No recent injury or fall.  No trauma.  No nausea vomiting or diarrhea.    Past Medical History  Diagnosis Date  . Hypertension     followed by [facility]  . Asthma     daily inhalers allegy shots.followed by [facility]  . Neuromuscular disorder (HCC)     numbness arms legs,feet   . Fibromyalgia   . Arthritis     osteoporosis  . Depression   . Anxiety     uses xanax as needed   . Headache(784.0)   . Shortness of breath   . Complication of anesthesia   . Sleep apnea   . Chronic kidney disease     see's kidney doctor for urine retention  . GERD (gastroesophageal reflux disease)     ,hx of stricture   Past Surgical History  Procedure Laterality Date  . Abdominal hysterectomy    . Cholecystectomy    . Knee arthroscopy      L KNEE  . Carpal tunnel release      R&L  . Anterior fusion cervical spine    . Lumbar laminectomy    . Esophageal dilation    . Cesarean section  1983  . Septoplasty  10+yrs ago  . Lumbar laminectomy/decompression microdiscectomy  02/25/2011    Procedure: LUMBAR LAMINECTOMY/DECOMPRESSION MICRODISCECTOMY;  Surgeon: Floyce Stakes;  Location: [place] [facility];  Service: Neurosurgery;  Laterality: N/A;  Lumbar Four-Five Microdiscectomy  . Back surgery  2012  . Lumbar wound debridement  03/18/2011    Procedure: LUMBAR WOUND DEBRIDEMENT;  Surgeon: Floyce Stakes;  Location: [place] [facility];  Service: Neurosurgery;  Laterality: N/A;  Exploration of Lumbar wound   Family History  Problem Relation Age of Onset  . Anesthesia problems Neg Hx   . Hypotension Neg Hx   . Malignant hyperthermia Neg Hx   . Pseudochol deficiency Neg Hx   . Arthritis Mother   . Hypertension Mother   . Kidney disease Mother   . COPD Mother   . Heart disease Mother 62    CAD  . Diabetes Mother   . Mental illness Mother   . Arthritis Father   . Diabetes Father   . Heart disease Father   . Cancer Father     prostate cancer  . Mental illness Father     Alzheimers Dementia  . Cancer Maternal Grandmother     brain tumor  . Cancer Maternal Grandfather     lung  . Diabetes Maternal Grandfather   . Early death Brother    Social History  Substance Use Topics  . Smoking status: Never Smoker   . Smokeless tobacco: Never Used  . Alcohol Use: No   OB History    No data available     Review of Systems  All other systems reviewed and are negative.     Allergies  Other; Percocet; Codeine; Dye fdc red; Morphine and related; and Vesicare  Home Medications   Prior to Admission medications   Medication Sig Start Date End Date Taking? Authorizing Provider  albuterol (PROVENTIL HFA;VENTOLIN HFA) 108 (90 BASE) MCG/ACT inhaler Inhale 1 puff into the lungs every 4 (four) hours as needed for wheezing or shortness of breath.    Historical Provider, MD  ALPRAZolam Duanne Moron) 1 MG tablet TAKE 1 TABLET BY MOUTH 3 TIMES A DAY AS NEEDED 05/19/15   Eulas Post, MD  Azelastine HCl (ASTEPRO) 0.15 % SOLN Place 1 spray into the nose 2 (two) times daily.      Historical Provider, MD  Bepotastine Besilate (BEPREVE OP) Place 1 drop into both eyes 2  (two) times daily.     Historical Provider, MD  budesonide-formoterol (SYMBICORT) 160-4.5 MCG/ACT inhaler Inhale 1 puff into the lungs 2 (two) times daily.    Historical Provider, MD  buPROPion (WELLBUTRIN XL) 150 MG 24 hr tablet Take 3 tablets by mouth at once daily. 10/03/14   Eulas Post, MD  cyclobenzaprine (FLEXERIL) 10 MG tablet Take 1 tablet (10 mg total) by mouth 3 (three) times daily as needed. For muscle spasms 03/26/14   Eulas Post, MD  diphenhydrAMINE (BENADRYL) 50 MG capsule Take 50 mg by mouth every 6 (six) hours as needed.      Historical Provider, MD  esomeprazole (NEXIUM) 40 MG capsule Take 1 capsule (40 mg total) by mouth 2 (two) times daily. Brand name only 10/03/14   Eulas Post, MD  fexofenadine (ALLEGRA) 180 MG tablet Take 180 mg by mouth daily.      Historical Provider, MD  fluticasone (VERAMYST) 27.5 MCG/SPRAY nasal spray Place 2 sprays into the nose daily.      Historical Provider, MD  hydrochlorothiazide (HYDRODIURIL) 25 MG tablet Take 1 tablet by mouth  daily 03/06/15   Eulas Post, MD  levETIRAcetam (KEPPRA) 250 MG tablet Take 500 mg by mouth 2 (two) times daily.    Historical Provider, MD  Mometasone Furo-Formoterol Fum (DULERA) 200-5 MCG/ACT AERO Inhale 1 puff into the lungs 2 (two) times daily.      Historical Provider, MD  montelukast (SINGULAIR) 10 MG tablet Take 10 mg by mouth at bedtime.    Historical Provider, MD  nystatin (MYCOSTATIN) powder Apply topically as needed.    Historical Provider, MD  pilocarpine (SALAGEN) 5 MG tablet Take 5 mg by mouth as needed.    Historical Provider, MD  PRESCRIPTION MEDICATION Inject 1 vial into the muscle every 14 (fourteen) days. Allergy shot     Historical Provider, MD  topiramate (TOPAMAX) 50 MG tablet 50 mg. One tab in AM, three tabs in PM  Per Dr Maryjean Ka, pain management 01/20/12   Historical Provider, MD  valACYclovir (VALTREX) 500 MG tablet Take 1 tablet by mouth two  times daily 10/03/14   Eulas Post, MD  Vilazodone HCl (VIIBRYD) 40 MG TABS Take 1 tablet (40 mg total) by mouth daily. 10/03/14   Eulas Post, MD  vitamin B-12 (CYANOCOBALAMIN) 1000 MCG tablet Take 1 tablet (1,000 mcg total) by mouth daily. 04/07/15   Eulas Post, MD  BP 115/75 mmHg  Pulse 73  Temp(Src) 99.2 F (37.3 C) (Oral)  Resp 16  Ht 5\' 5"  (1.651 m)  Wt 250 lb (113.399 kg)  BMI 41.60 kg/m2  SpO2 97% Physical Exam  Constitutional: She is oriented to person, place, and time. She appears well-developed and well-nourished. No distress.  HENT:  Head: Normocephalic and atraumatic.  Eyes: EOM are normal. Pupils are equal, round, and reactive to light.  Neck: Normal range of motion.  Cardiovascular: Normal rate, regular rhythm and normal heart sounds.   Pulmonary/Chest: Effort normal and breath sounds normal.  Abdominal: Soft. She exhibits no distension. There is no tenderness.  Musculoskeletal: Normal range of motion.  Neurological: She is alert and oriented to person, place, and time.  5/5 strength in major muscle groups of  bilateral upper and lower extremities. Speech normal. No facial asymetry.  Ataxic gait  Skin: Skin is warm and dry.  Psychiatric: She has a normal mood and affect. Judgment normal.  Nursing note and vitals reviewed.   ED Course  Procedures (including critical care time) Labs Review Labs Reviewed  CBC WITH DIFFERENTIAL/PLATELET - Abnormal; Notable for the following:    RBC 3.70 (*)    Hemoglobin 11.3 (*)    HCT 34.7 (*)    All other components within normal limits  COMPREHENSIVE METABOLIC PANEL - Abnormal; Notable for the following:    Glucose, Bld 100 (*)    Creatinine, Ser 1.07 (*)    Calcium 8.5 (*)    ALT 12 (*)    GFR calc non Af Amer 57 (*)    All other components within normal limits    Imaging Review Ct Head Wo Contrast  05/20/2015  CLINICAL DATA:  Posterior headache and dizziness. EXAM: CT HEAD WITHOUT CONTRAST TECHNIQUE: Contiguous axial images were  obtained from the base of the skull through the vertex without intravenous contrast. COMPARISON:  None. FINDINGS: No evidence of parenchymal hemorrhage or extra-axial fluid collection. No mass lesion, mass effect, or midline shift. No CT evidence of acute infarction. Mild intracranial atherosclerosis. Cerebral volume is age appropriate. No ventriculomegaly. The visualized paranasal sinuses are essentially clear. The mastoid air cells are unopacified. No evidence of calvarial fracture. IMPRESSION: No evidence of acute intracranial abnormality. Mild intracranial atherosclerosis. Electronically Signed   By: Ilona Sorrel M.D.   On: 05/20/2015 15:00   I have personally reviewed and evaluated these images and lab results as part of my medical decision-making.   EKG Interpretation   Date/Time:  Wednesday May 20 2015 13:31:10 EST Ventricular Rate:  83 PR Interval:  154 QRS Duration: 84 QT Interval:  368 QTC Calculation: 432 R Axis:   52 Text Interpretation:  Normal sinus rhythm Low voltage QRS Borderline ECG  No significant change was found Confirmed by Tyrianna Lightle  MD, Lennette Bihari ([id number]) on  05/20/2015 2:51:00 PM      MDM   Final diagnoses:  None    Mild ataxia.  Some of her symptoms are more than likely atypical vertigo.  Despite medication she continues to have symptoms at this time.  The family is concerned about the possibility of stroke given the concern for possible right-sided facial drooping earlier.  Patient be transferred to the emergency department at [facility] for an MRI of her brain.  I spoke with Dr. Vallery Ridge who accepts the patient in transfer.  If her MRI is negative she can be sent home to follow-up with her primary care physician and neurology.    Jola Schmidt, MD  05/20/15 1616 

## 2015-05-20 NOTE — ED Notes (Signed)
Pt left at this time with all belongings.  

## 2015-05-20 NOTE — ED Notes (Signed)
Pt placed on auto vitals Q30. Patient placed on cardiac monitor.  

## 2015-05-22 ENCOUNTER — Ambulatory Visit (INDEPENDENT_AMBULATORY_CARE_PROVIDER_SITE_OTHER): Payer: Medicare Other | Admitting: Family Medicine

## 2015-05-22 VITALS — BP 120/88 | HR 96 | Temp 97.6°F | Ht 65.0 in | Wt 251.1 lb

## 2015-05-22 DIAGNOSIS — R42 Dizziness and giddiness: Secondary | ICD-10-CM | POA: Diagnosis not present

## 2015-05-22 NOTE — Progress Notes (Signed)
Pre visit review using our clinic review tool, if applicable. No additional management support is needed unless otherwise documented below in the visit note. 

## 2015-05-22 NOTE — Progress Notes (Signed)
Subjective:    Patient ID: Taylor Ritter, female    DOB: Feb 13, 1961, 55 y.o.   MRN: [id number]  HPI Patient seen for ER follow-up.  around February 28 she felt "off-balance ". There are some question of right facial droop but no slurred speech. She initially went to emergency department in [facility] and had CT head which is unremarkable. She did not have any focal weakness. Denied any extremity weakness.   She was referred to emergency department at [facility]. MRI and MR angiogram there were unremarkable. She had CBC and comprehensive metabolic panel which showed no acute abnormalities. Family initially had some concern regarding Bell's palsy but this was not confirmed. There is no evidence for aneurysm, stroke or other acute abnormality. It was felt that she had some atypical vertigo type symptoms. Patient denied having any headaches, visual changes, focal weakness, cognitive changes , or any clear ataxia symptoms. She was prescribed meclizine which is helped slightly with her mild nausea. She still feels off balance at times but slightly improved.   She has not noted any directional component or dizziness. No clear triggers or alleviating factors.  Past Medical History  Diagnosis Date  . Hypertension     followed by [facility]  . Asthma     daily inhalers allegy shots.followed by [facility]  . Neuromuscular disorder (HCC)     numbness arms legs,feet   . Fibromyalgia   . Arthritis     osteoporosis  . Depression   . Anxiety     uses xanax as needed   . Headache(784.0)   . Shortness of breath   . Complication of anesthesia   . Sleep apnea   . Chronic kidney disease     see's kidney doctor for urine retention  . GERD (gastroesophageal reflux disease)     ,hx of stricture   Past Surgical History  Procedure Laterality Date  . Abdominal hysterectomy    . Cholecystectomy    . Knee arthroscopy      L KNEE  . Carpal tunnel release     R&L  . Anterior fusion cervical spine    . Lumbar laminectomy    . Esophageal dilation    . Cesarean section  1983  . Septoplasty  10+yrs ago  . Lumbar laminectomy/decompression microdiscectomy  02/25/2011    Procedure: LUMBAR LAMINECTOMY/DECOMPRESSION MICRODISCECTOMY;  Surgeon: Floyce Stakes;  Location: [place] [facility];  Service: Neurosurgery;  Laterality: N/A;  Lumbar Four-Five Microdiscectomy  . Back surgery  2012  . Lumbar wound debridement  03/18/2011    Procedure: LUMBAR WOUND DEBRIDEMENT;  Surgeon: Floyce Stakes;  Location: [place] [facility];  Service: Neurosurgery;  Laterality: N/A;  Exploration of Lumbar wound    reports that she has never smoked. She has never used smokeless tobacco. She reports that she does not drink alcohol or use illicit drugs. family history includes Arthritis in her father and mother; COPD in her mother; Cancer in her father, maternal grandfather, and maternal grandmother; Diabetes in her father, maternal grandfather, and mother; Early death in her brother; Heart disease in her father; Heart disease (age of onset: 67) in her mother; Hypertension in her mother; Kidney disease in her mother; Mental illness in her father and mother. There is no history of Anesthesia problems, Hypotension, Malignant hyperthermia, or Pseudochol deficiency. Allergies  Allergen Reactions  . Other Other (See Comments)    Nylon sutures had to remove to allow healing  Vicryl sutures  .  Percocet [Oxycodone-Acetaminophen] Other (See Comments)    "makes her crazy"  . Codeine Hives  . Dye Fdc Red [Red Dye] Hives    Scan dye, broke out in hives all over once with a neck scan  . Morphine And Related Hives and Itching  . Vesicare [Solifenacin Succinate] Itching       Review of Systems  Constitutional: Negative for fatigue and unexpected weight change.  Eyes: Negative for visual disturbance.  Respiratory: Negative for cough, chest tightness, shortness of breath and wheezing.     Cardiovascular: Negative for chest pain, palpitations and leg swelling.  Genitourinary: Negative for dysuria.  Neurological: Positive for dizziness. Negative for seizures, syncope, weakness, light-headedness and headaches.  Psychiatric/Behavioral: Negative for confusion.       Objective:   Physical Exam  Constitutional: She is oriented to person, place, and time. She appears well-developed and well-nourished.  HENT:  Right Ear: External ear normal.  Left Ear: External ear normal.  Neck: Neck supple.  No carotid bruit  Cardiovascular: Normal rate and regular rhythm.   Pulmonary/Chest: Effort normal and breath sounds normal. No respiratory distress. She has no wheezes. She has no rales.  Neurological: She is alert and oriented to person, place, and time. No cranial nerve deficit.  No focal weakness. Gait normal. Cerebellar function normal by finger to nose testing and heel-to-shin testing. No facial weakness.  Psychiatric: She has a normal mood and affect. Her behavior is normal.          Assessment & Plan:   atypical vertigo symptoms. Recent workup including CT head, MRI brain, and MR angiogram unremarkable. No evidence for recent stroke. No obvious Bell's palsy. Non-focal neuro exam at this time. We recommended observation. Continue meclizine as needed for any nausea. Would not consider vestibular rehabilitation at this point she's had previous cervical neck surgery and manipulation of spine would not be recommended. Also, current symptoms are not clearly related to benign peripheral positional vertigo. Recommend neurology follow-up if symptoms persist

## 2015-05-22 NOTE — Patient Instructions (Signed)

## 2015-07-21 ENCOUNTER — Other Ambulatory Visit: Payer: Self-pay | Admitting: General Practice

## 2015-07-21 MED ORDER — ESOMEPRAZOLE MAGNESIUM 40 MG PO CPDR: 40.0000 mg | DELAYED_RELEASE_CAPSULE | Freq: Two times a day (BID) | ORAL | Status: DC

## 2015-07-22 ENCOUNTER — Telehealth: Payer: Self-pay | Admitting: Family Medicine

## 2015-07-22 NOTE — Telephone Encounter (Signed)
[facility] [phone] Option 1   Ref [id number]  [facility] would like to make you aware this current dose of  esomeprazole (NEXIUM) 40 MG capsule BID  exceeds the recommended dose. Only 1 x day is what is covered. Do you want to change rx or proceed with a prior authorization?

## 2015-07-23 NOTE — Telephone Encounter (Signed)
Prior Auth.  Pt has not been able to reduce to qd in recent past.

## 2015-07-24 NOTE — Telephone Encounter (Signed)
Please do Prior Auth per Dr.Burchette.

## 2015-08-04 ENCOUNTER — Telehealth: Payer: Self-pay

## 2015-08-04 NOTE — Telephone Encounter (Signed)
Pt is a candidate for an AWV for 2017

## 2015-08-05 ENCOUNTER — Telehealth: Payer: Self-pay | Admitting: Family Medicine

## 2015-08-05 NOTE — Telephone Encounter (Signed)
Received a fax from [facility].  Needed pa for Nexium 40 mg capsule for the pt to take bid.  Submitted by CoverMyMeds on 08/05/15.  Waiting on a response.

## 2015-08-07 ENCOUNTER — Other Ambulatory Visit: Payer: Self-pay | Admitting: Family Medicine

## 2015-08-07 MED ORDER — ESOMEPRAZOLE MAGNESIUM 40 MG PO CPDR: 40.0000 mg | DELAYED_RELEASE_CAPSULE | Freq: Two times a day (BID) | ORAL | Status: DC

## 2015-08-07 NOTE — Telephone Encounter (Signed)
No PA needed.  Spoke to the pt and informed her.  She has picked up her prescription from the local pharmacy.  Requesting a new prescription be sent to [facility].  Request sent to [place].

## 2015-08-07 NOTE — Telephone Encounter (Signed)
Pt requesting medication be sent to [facility] instead of local pharmacy.

## 2015-10-07 ENCOUNTER — Telehealth: Payer: Self-pay

## 2015-10-07 NOTE — Telephone Encounter (Signed)
Call to Ms. Gray and agreed to come in for AWV 8/25 at 11:30 as she is having allergy testing prior.

## 2015-10-12 ENCOUNTER — Other Ambulatory Visit: Payer: Self-pay | Admitting: *Deleted

## 2015-10-13 MED ORDER — ALPRAZOLAM 1 MG PO TABS: 1.0000 mg | ORAL_TABLET | Freq: Three times a day (TID) | ORAL | 3 refills | Status: DC | PRN

## 2015-10-13 NOTE — Telephone Encounter (Signed)
Refill with 3 additional refills   

## 2015-11-04 ENCOUNTER — Other Ambulatory Visit: Payer: Self-pay | Admitting: Family Medicine

## 2015-11-11 ENCOUNTER — Other Ambulatory Visit: Payer: Self-pay | Admitting: Anesthesiology

## 2015-11-12 NOTE — Progress Notes (Addendum)
Subjective:   Taylor Ritter is a 55 y.o. female who presents for an Initial Medicare Annual Wellness Visit.  HRA assessment completed during this visit with Taylor Ritter Last seen for acute visit in March/ labs drawn 03/2015;   The Patient was informed that the wellness visit is to identify future health risk and educate and initiate measures that can reduce risk for increased disease through the lifespan.    Feels health is good;  Having surgery the 29th of Sept Will have TENs unit implanted for pain; has trail now and has helped her pain.  Pain in legs all the time; implant helped there as well  1st surgery was in 2000;    NO ROS; Medicare Wellness Visit  Psychosocial: family Hx; (mother OA, htn; HD; DM; MI; fathre had OA; DM; HD; Cancer (prostate) and MI) 2 children  57 yo; 26 yo  living with her; Mother lives in [place];  Other Dtr is 73 and has 2 grand children Expecting one great grand- from grand dtr who lives with them  Tobacco; never smoked ETOH ; no   Medications reviewed for issues; compliance; otc meds No issues but does not like mail order / has to make several calls to get her meds; alerted to file medicare c/o  B12 po makes her sick; does not take/ has not taken in a couple of months;  Will discuss with Dr. Elease Hashimoto taking B12 injections; will send basket note and get back to ms. Mcavoy/ Understands she needs th B12 but states she is used to being anemic   BMI: 42  A1c 5.8 / HDL 50; Trig 148  Educated regarding elevated A1c   Diet: Cooks at home Breakfast; this am [employer]; Strawberry and creme pie today Generally toast; slice of tomato Lunch; sandwich Supper; full course cooked meal Bake, broil and fry;   Jan 280 lb and now dropped to 240; dizzy spells; affected bowels Put on linzess etc  Weight at 255 now Tea at supper / states she modified her diet to drop weight  Dental work: has Transport planner  Given information regarding [place] at  [place];   Walking is limited; if on pavement;  Pool is recommended by pain doctor and chiro. Can check on pool since [facility] does cover Y Agreed to try this    [place] term plan reviewed  Has 2 to 3 steps back door;  Fall hx; misses steps on occasion;  Tries to avoid steps  Can't feel feet Can't move her neck up and down and can't see where she going chiro loosens muscles at neck;  ADls ok;  IADL; uses cart to buy groceries  Home bathrooms are accessible for bathing  Has commode seat etc   Given education on "Fall Prevention in the Home" for more safety tips the patient can apply as appropriate.   Personal safety issues reviewed:  1.  for risk such as safe community/ yes 2. Just moved into a safer place now  3.  smoke detector/ yes 4.  firearms safety if applicable  5. protection when in the sun; no;  6. driving safety for seniors or any recent accidents.   Risk for Depression reviewed: Any emotional problems? Anxious, depressed, irritable, sad or blue? no Denies feeling depressed or hopeless; voices pleasure in daily life How many social activities have you been engaged in within the last 2 weeks? no   Cognitive; no issues  at this time  Manages checkbook, medications; no failures of task Ad8 score reviewed for  issues;  Issues making decisions; no  Less interest in hobbies / activities" no  Repeats questions, stories; family complaining: NO  Trouble using ordinary gadgets; microwave; computer: no  Forgets the month or year: no  Mismanaging finances: no  Missing apt: no but does write them down  Daily problems with thinking of memory NO Ad8 score is 0  MMSE not appropriate unless AD8 score is > 2   Advanced Directive addressed;  Given infor and has completed was helping father to completed his   Counseling Health Maintenance Gaps: Hep C and HIV; educated on screenings; will defer to medical Flu / Agreed to take today as it si 4 weeks out  from her planned surgery  Colonoscopy; 08/2012; due  08/2017/ not 2024 EKG: 05/2015 Mammogram: 08/2012/ due and goes to the breast center Dexa/ < 65  PAP: educated regarding the need for GYN exam and pap q 3 years   Hearing: 4000hz  both ears  Ophthalmology exam/ had 2 years ago Dr. Alois Cliche; Wears glasses  / no other issues     Immunizations Due: (Vaccines reviewed and educated regarding any overdue)    Established and updated Risk reviewed and appropriate referral made or health recommendations:  Current Care Team reviewed and updated       Objective:    Today's Vitals   11/13/15 1135  BP: 120/80  Pulse: 78  SpO2: 97%  Weight: 255 lb 4 oz (115.8 kg)  Height: 5\' 6"  (1.676 m)   Body mass index is 41.2 kg/m.   Current Medications (verified) Outpatient Encounter Prescriptions as of 11/13/2015  Medication Sig  . albuterol (PROVENTIL HFA;VENTOLIN HFA) 108 (90 BASE) MCG/ACT inhaler Inhale 1 puff into the lungs every 4 (four) hours as needed for wheezing or shortness of breath.  . ALPRAZolam (XANAX) 1 MG tablet Take 1 tablet (1 mg total) by mouth 3 (three) times daily as needed.  . Azelastine HCl (ASTEPRO) 0.15 % SOLN Place 1 spray into the nose 2 (two) times daily.    . Bepotastine Besilate (BEPREVE OP) Place 1 drop into both eyes 2 (two) times daily.   . budesonide-formoterol (SYMBICORT) 160-4.5 MCG/ACT inhaler Inhale 1 puff into the lungs 2 (two) times daily.  Marland Kitchen buPROPion (WELLBUTRIN XL) 150 MG 24 hr tablet Take 3 tablets by mouth  once a day  . cyclobenzaprine (FLEXERIL) 10 MG tablet Take 1 tablet (10 mg total) by mouth 3 (three) times daily as needed. For muscle spasms  . diphenhydrAMINE (BENADRYL) 50 MG capsule Take 50 mg by mouth every 6 (six) hours as needed for allergies.   Marland Kitchen esomeprazole (NEXIUM) 40 MG capsule Take 1 capsule (40 mg total) by mouth 2 (two) times daily. Brand name only  . fexofenadine (ALLEGRA) 180 MG tablet Take 180 mg by mouth daily.    .  fluticasone (VERAMYST) 27.5 MCG/SPRAY nasal spray Place 2 sprays into the nose daily.    . hydrochlorothiazide (HYDRODIURIL) 25 MG tablet Take 1 tablet by mouth  daily  . levETIRAcetam (KEPPRA) 250 MG tablet Take 500 mg by mouth 2 (two) times daily.  Marland Kitchen linaclotide (LINZESS) 145 MCG CAPS capsule Take 145 mcg by mouth 2 (two) times daily.  . meclizine (ANTIVERT) 25 MG tablet Take 1 tablet (25 mg total) by mouth 3 (three) times daily as needed for dizziness.  . Mometasone Furo-Formoterol Fum (DULERA) 200-5 MCG/ACT AERO Inhale 1 puff into the lungs 2 (two) times  daily.    . montelukast (SINGULAIR) 10 MG tablet Take 10 mg by mouth at bedtime.  . pilocarpine (SALAGEN) 5 MG tablet Take 5 mg by mouth as needed (dryness).   Marland Kitchen PRESCRIPTION MEDICATION Inject 1 vial into the muscle every 14 (fourteen) days. Allergy shot   . rifaximin (XIFAXAN) 550 MG TABS tablet Take 550 mg by mouth.  . topiramate (TOPAMAX) 50 MG tablet 50 mg. One tab in AM, three tabs in PM  Per Dr Maryjean Ka, pain management  . valACYclovir (VALTREX) 500 MG tablet Take 1 tablet by mouth two  times daily  . VIIBRYD 40 MG TABS Take 1 tablet by mouth  daily  . vitamin B-12 (CYANOCOBALAMIN) 1000 MCG tablet Take 1 tablet (1,000 mcg total) by mouth daily.   No facility-administered encounter medications on file as of 11/13/2015.     Allergies (verified) Other; Percocet [oxycodone-acetaminophen]; Codeine; Dye fdc red [red dye]; Morphine and related; and Vesicare [solifenacin succinate]   History: Past Medical History:  Diagnosis Date  . Anxiety    uses xanax as needed   . Arthritis    osteoporosis  . Asthma    daily inhalers allegy shots.followed by [facility]  . Chronic kidney disease    see's kidney doctor for urine retention  . Complication of anesthesia   . Depression   . Fibromyalgia   . GERD (gastroesophageal reflux disease)    ,hx of stricture  . Headache(784.0)   . Hypertension    [facility] by [facility]  . Neuromuscular disorder (HCC)    numbness arms legs,feet   . Shortness of breath   . Sleep apnea    Past Surgical History:  Procedure Laterality Date  . ABDOMINAL HYSTERECTOMY    . ANTERIOR FUSION CERVICAL SPINE    . BACK SURGERY  2012  . CARPAL TUNNEL RELEASE     R&L  . CESAREAN SECTION  1983  . CHOLECYSTECTOMY    . ESOPHAGEAL DILATION    . KNEE ARTHROSCOPY     L KNEE  . LUMBAR LAMINECTOMY    . LUMBAR LAMINECTOMY/DECOMPRESSION MICRODISCECTOMY  02/25/2011   Procedure: LUMBAR LAMINECTOMY/DECOMPRESSION MICRODISCECTOMY;  Surgeon: Floyce Stakes;  Location: [place] [facility];  Service: Neurosurgery;  Laterality: N/A;  Lumbar Four-Five Microdiscectomy  . LUMBAR WOUND DEBRIDEMENT  03/18/2011   Procedure: LUMBAR WOUND DEBRIDEMENT;  Surgeon: Floyce Stakes;  Location: [place] [facility];  Service: Neurosurgery;  Laterality: N/A;  Exploration of Lumbar wound  . SEPTOPLASTY  10+yrs ago   Family History  Problem Relation Age of Onset  . Arthritis Mother   . Hypertension Mother   . Kidney disease Mother   . COPD Mother   . Heart disease Mother 28    CAD  . Diabetes Mother   . Mental illness Mother   . Arthritis Father   . Diabetes Father   . Heart disease Father   . Cancer Father     prostate cancer  . Mental illness Father     Alzheimers Dementia  . Cancer Maternal Grandmother     brain tumor  . Cancer Maternal Grandfather     lung  . Diabetes Maternal Grandfather   . Early death Brother   . Anesthesia problems Neg Hx   . Hypotension Neg Hx   . Malignant hyperthermia Neg Hx   . Pseudochol deficiency Neg Hx    Social History   Occupational History  . Not on file.   Social History Main Topics  .  Smoking status: Never Smoker  . Smokeless tobacco: Never Used  . Alcohol use No  . Drug use: No  . Sexual activity: Yes    Birth control/ protection: Other-see comments     Comment: HYST    Tobacco Counseling Counseling given: Yes   Activities of Daily  Living In your present state of health, do you have any difficulty performing the following activities: 11/13/2015  Hearing? N  Some recent data might be hidden    Immunizations and Health Maintenance Immunization History  Administered Date(s) Administered  . Influenza Split 03/19/2011  . Influenza,inj,Quad PF,36+ Mos 01/02/2013, 11/13/2015  . Pneumococcal Polysaccharide-23 08/02/2012  . Tdap 08/09/2012   Health Maintenance Due  Topic Date Due  . Hepatitis C Screening  1961/01/13  . HIV Screening  10/17/1974  . INFLUENZA VACCINE  10/20/2015    Patient Care Team: Eulas Post, MD as PCP - General (Family Medicine)  Indicate any recent Medical Services you may have received from other than [facility] providers in the past year (date may be approximate).     Assessment:   This is a routine wellness examination for Mylasia.   Hearing/Vision screen  Hearing Screening   125Hz  250Hz  500Hz  1000Hz  2000Hz  3000Hz  4000Hz  6000Hz  8000Hz   Right ear:     100      Left ear:     100        Dietary issues and exercise activities discussed: Hx of weight loss;     Goals    . Exercise 150 minutes per week (moderate activity)          Both doctors she sees is promoting pool exercise; Try it!!!  [facility] does pay for silver sneaker;     . patient          To get her dogs certified as service dogs to feel safer;  Manuela Schwartz will call with more information       Depression Screen No flowsheet data found.  Fall Risk Fall Risk  11/13/2015  Falls in the past year? Yes  Number falls in past yr: 2 or more  Injury with Fall? Yes  Follow up Education provided    Cognitive Function: No flowsheet data found.  Ad8 score 0   Screening Tests Health Maintenance  Topic Date Due  . Hepatitis C Screening  1960-07-19  . HIV Screening  10/17/1974  . INFLUENZA VACCINE  10/20/2015  . PAP SMEAR  04/06/2035 (Originally 08/03/2014)  . MAMMOGRAM  11/06/2016  . TETANUS/TDAP  08/10/2022  . COLONOSCOPY   08/24/2022      Plan:   colonscopy due 08/2017; Dr; Carol Ada Will call the office and confirm   Mammogram due at the breast center  Eye exam due   Pap smears recommended q 3 years 55;  Has GYN across [facility]   Feels Dr. Harold Hedge (allergist) feels like she had this over there  Goes to [place] next week to get allergy;    Will outreach Dr.Burchette about the B12 and switching to IM  Will try the pool for exercise     During the course of the visit, Ernisha was educated and counseled about the following appropriate screening and preventive services:   Vaccines to include Pneumoccal, Influenza, Hepatitis B, Td, Zostavax, HCV  Electrocardiogram  Cardiovascular disease screening/ a1c spiking; obesity  Colorectal cancer screening; Dr. Benson Norway; states due 2019   Bone density screening/ deferred   Diabetes screening; A1c 5.8   Glaucoma screening/neg  Mammography/ to have  soon   Nutrition counseling/ educated   Smoking cessation counseling /does not smoke  Patient Instructions (the written plan) were given to the patient.    [id number], RN   11/13/2015    Above reviewed. I would make sure she is taking her oral B12 with food.  If still having upset with that would schedule her to start B12 injections.  Eulas Post MD [facility]

## 2015-11-13 ENCOUNTER — Telehealth: Payer: Self-pay

## 2015-11-13 ENCOUNTER — Ambulatory Visit (INDEPENDENT_AMBULATORY_CARE_PROVIDER_SITE_OTHER): Payer: Medicare Other

## 2015-11-13 DIAGNOSIS — Z0000 Encounter for general adult medical examination without abnormal findings: Secondary | ICD-10-CM

## 2015-11-13 DIAGNOSIS — Z23 Encounter for immunization: Secondary | ICD-10-CM | POA: Diagnosis not present

## 2015-11-13 NOTE — Telephone Encounter (Signed)
The patient in for AWV. Dr. Elease Hashimoto; I will try to get report for colonoscopy; States it is due in 5 years; not 10  Also states she is not taking her B12 po. States it hurts her stomach; Do you want to transition to IM? Or other Please advise  Tks

## 2015-11-13 NOTE — Patient Instructions (Addendum)
Taylor Ritter , Thank you for taking time to come for your Medicare Wellness Visit. I appreciate your ongoing commitment to your health goals. Please review the following plan we discussed and let me know if I can assist you in the future.   For dental care for children on medicaid; fup website [email or web address]  colonscopy due 08/2017; Dr; Carol Ada  Mammogram due at the [facility]  Eye exam due   Pap smears recommended q 3 years 3;  [place] [facility]   Feels Dr. Harold Hedge (allergist) feels like she had this over there  Goes to [place] next week to get allergy;    Will outreach Dr.Burchette about the B12 and switching to IM  Will try the pool   These are the goals we discussed: Goals    . patient          To get her dogs certified as service dogs to feel safer;  Taylor Ritter will call with more information        This is a list of the screening recommended for you and due dates:  Health Maintenance  Topic Date Due  .  Hepatitis C: One time screening is recommended by Center for Disease Control  (CDC) for  adults born from [birth year] through 1966.   Sep 13, 1960  . HIV Screening  10/17/1974  . Flu Shot  10/20/2015  . Pap Smear  04/06/2035*  . Mammogram  11/06/2016  . Tetanus Vaccine  08/10/2022  . Colon Cancer Screening  08/24/2022  *Topic was postponed. The date shown is not the original due date.  '    Fall Prevention in the [place] can cause injuries. They can happen to people of all ages. There are many things you can do to make your home safe and to help prevent falls.  WHAT CAN I DO ON THE OUTSIDE OF MY HOME?  Regularly fix the edges of walkways and driveways and fix any cracks.  Remove anything that might make you trip as you walk through a door, such as a raised step or threshold.  Trim any bushes or trees on the path to your home.  Use bright outdoor lighting.  Clear any walking paths of anything that might  make someone trip, such as rocks or tools.  Regularly check to see if handrails are loose or broken. Make sure that both sides of any steps have handrails.  Any raised decks and porches should have guardrails on the edges.  Have any leaves, snow, or ice cleared regularly.  Use sand or salt on walking paths during winter.  Clean up any spills in your garage right away. This includes oil or grease spills. WHAT CAN I DO IN THE BATHROOM?   Use night lights.  Install grab bars by the toilet and in the tub and shower. Do not use towel bars as grab bars.  Use non-skid mats or decals in the tub or shower.  If you need to sit down in the shower, use a plastic, non-slip stool.  Keep the floor dry. Clean up any water that spills on the floor as soon as it happens.  Remove soap buildup in the tub or shower regularly.  Attach bath mats securely with double-sided non-slip rug tape.  Do not have throw rugs and other things on the floor that can make you trip. WHAT CAN I DO IN THE BEDROOM?  Use night lights.  Make sure that you have a light by your bed that  is easy to reach.  Do not use any sheets or blankets that are too big for your bed. They should not hang down onto the floor.  Have a firm chair that has side arms. You can use this for support while you get dressed.  Do not have throw rugs and other things on the floor that can make you trip. WHAT CAN I DO IN THE KITCHEN?  Clean up any spills right away.  Avoid walking on wet floors.  Keep items that you use a lot in easy-to-reach places.  If you need to reach something above you, use a strong step stool that has a grab bar.  Keep electrical cords out of the way.  Do not use floor polish or wax that makes floors slippery. If you must use wax, use non-skid floor wax.  Do not have throw rugs and other things on the floor that can make you trip. WHAT CAN I DO WITH MY STAIRS?  Do not leave any items on the stairs.  Make sure  that there are handrails on both sides of the stairs and use them. Fix handrails that are broken or loose. Make sure that handrails are as long as the stairways.  Check any carpeting to make sure that it is firmly attached to the stairs. Fix any carpet that is loose or worn.  Avoid having throw rugs at the top or bottom of the stairs. If you do have throw rugs, attach them to the floor with carpet tape.  Make sure that you have a light switch at the top of the stairs and the bottom of the stairs. If you do not have them, ask someone to add them for you. WHAT ELSE CAN I DO TO HELP PREVENT FALLS?  Wear shoes that:  Do not have high heels.  Have rubber bottoms.  Are comfortable and fit you well.  Are closed at the toe. Do not wear sandals.  If you use a stepladder:  Make sure that it is fully opened. Do not climb a closed stepladder.  Make sure that both sides of the stepladder are locked into place.  Ask someone to hold it for you, if possible.  Clearly mark and make sure that you can see:  Any grab bars or handrails.  First and last steps.  Where the edge of each step is.  Use tools that help you move around (mobility aids) if they are needed. These include:  Canes.  Walkers.  Scooters.  Crutches.  Turn on the lights when you go into a dark area. Replace any light bulbs as soon as they burn out.  Set up your furniture so you have a clear path. Avoid moving your furniture around.  If any of your floors are uneven, fix them.  If there are any pets around you, be aware of where they are.  Review your medicines with your doctor. Some medicines can make you feel dizzy. This can increase your chance of falling. Ask your doctor what other things that you can do to help prevent falls.   This information is not intended to replace advice given to you by your health care provider. Make sure you discuss any questions you have with your health care provider.   Document  Released: 01/01/2009 Document Revised: 07/22/2014 Document Reviewed: 04/11/2014 Elsevier Interactive Patient Education 2016 [place] Maintenance, Female Adopting a healthy lifestyle and getting preventive care can go a long way to promote health and wellness. Talk with  your health care provider about what schedule of regular examinations is right for you. This is a good chance for you to check in with your provider about disease prevention and staying healthy. In between checkups, there are plenty of things you can do on your own. Experts have done a lot of research about which lifestyle changes and preventive measures are most likely to keep you healthy. Ask your health care provider for more information. WEIGHT AND DIET  Eat a healthy diet  Be sure to include plenty of vegetables, fruits, low-fat dairy products, and lean protein.  Do not eat a lot of foods high in solid fats, added sugars, or salt.  Get regular exercise. This is one of the most important things you can do for your health.  Most adults should exercise for at least 150 minutes each week. The exercise should increase your heart rate and make you sweat (moderate-intensity exercise).  Most adults should also do strengthening exercises at least twice a week. This is in addition to the moderate-intensity exercise.  Maintain a healthy weight  Body mass index (BMI) is a measurement that can be used to identify possible weight problems. It estimates body fat based on height and weight. Your health care provider can help determine your BMI and help you achieve or maintain a healthy weight.  For females 58 years of age and older:   A BMI below 18.5 is considered underweight.  A BMI of 18.5 to 24.9 is normal.  A BMI of 25 to 29.9 is considered overweight.  A BMI of 30 and above is considered obese.  Watch levels of cholesterol and blood lipids  You should start having your blood tested for lipids and  cholesterol at 56 years of age, then have this test every 5 years.  You may need to have your cholesterol levels checked more often if:  Your lipid or cholesterol levels are high.  You are older than 55 years of age.  You are at high risk for heart disease.  CANCER SCREENING   Lung Cancer  Lung cancer screening is recommended for adults 41-92 years old who are at high risk for lung cancer because of a history of smoking.  A yearly low-dose CT scan of the lungs is recommended for people who:  Currently smoke.  Have quit within the past 15 years.  Have at least a 30-pack-year history of smoking. A pack year is smoking an average of one pack of cigarettes a day for 1 year.  Yearly screening should continue until it has been 15 years since you quit.  Yearly screening should stop if you develop a health problem that would prevent you from having lung cancer treatment.  Breast Cancer  Practice breast self-awareness. This means understanding how your breasts normally appear and feel.  It also means doing regular breast self-exams. Let your health care provider know about any changes, no matter how small.  If you are in your 20s or 30s, you should have a clinical breast exam (CBE) by a health care provider every 1-3 years as part of a regular health exam.  If you are 11 or older, have a CBE every year. Also consider having a breast X-ray (mammogram) every year.  If you have a family history of breast cancer, talk to your health care provider about genetic screening.  If you are at high risk for breast cancer, talk to your health care provider about having an MRI and a mammogram every year.  Breast cancer gene (BRCA) assessment is recommended for women who have family members with BRCA-related cancers. BRCA-related cancers include:  Breast.  Ovarian.  Tubal.  Peritoneal cancers.  Results of the assessment will determine the need for genetic counseling and BRCA1 and BRCA2  testing. Cervical Cancer Your health care provider may recommend that you be screened regularly for cancer of the pelvic organs (ovaries, uterus, and vagina). This screening involves a pelvic examination, including checking for microscopic changes to the surface of your cervix (Pap test). You may be encouraged to have this screening done every 3 years, beginning at age 39.  For women ages 13-65, health care providers may recommend pelvic exams and Pap testing every 3 years, or they may recommend the Pap and pelvic exam, combined with testing for human papilloma virus (HPV), every 5 years. Some types of HPV increase your risk of cervical cancer. Testing for HPV may also be done on women of any age with unclear Pap test results.  Other health care providers may not recommend any screening for nonpregnant women who are considered low risk for pelvic cancer and who do not have symptoms. Ask your health care provider if a screening pelvic exam is right for you.  If you have had past treatment for cervical cancer or a condition that could lead to cancer, you need Pap tests and screening for cancer for at least 20 years after your treatment. If Pap tests have been discontinued, your risk factors (such as having a new sexual partner) need to be reassessed to determine if screening should resume. Some women have medical problems that increase the chance of getting cervical cancer. In these cases, your health care provider may recommend more frequent screening and Pap tests. Colorectal Cancer  This type of cancer can be detected and often prevented.  Routine colorectal cancer screening usually begins at 56 years of age and continues through 56 years of age.  Your health care provider may recommend screening at an earlier age if you have risk factors for colon cancer.  Your health care provider may also recommend using home test kits to check for hidden blood in the stool.  A small camera at the end of a  tube can be used to examine your colon directly (sigmoidoscopy or colonoscopy). This is done to check for the earliest forms of colorectal cancer.  Routine screening usually begins at age 38.  Direct examination of the colon should be repeated every 5-10 years through 56 years of age. However, you may need to be screened more often if early forms of precancerous polyps or small growths are found. Skin Cancer  Check your skin from head to toe regularly.  Tell your health care provider about any new moles or changes in moles, especially if there is a change in a mole's shape or color.  Also tell your health care provider if you have a mole that is larger than the size of a pencil eraser.  Always use sunscreen. Apply sunscreen liberally and repeatedly throughout the day.  Protect yourself by wearing long sleeves, pants, a wide-brimmed hat, and sunglasses whenever you are outside. HEART DISEASE, DIABETES, AND HIGH BLOOD PRESSURE   High blood pressure causes heart disease and increases the risk of stroke. High blood pressure is more likely to develop in:  People who have blood pressure in the high end of the normal range (130-139/85-89 mm Hg).  People who are overweight or obese.  People who are African American.  If you  are 86-28 years of age, have your blood pressure checked every 3-5 years. If you are 50 years of age or older, have your blood pressure checked every year. You should have your blood pressure measured twice--once when you are at a hospital or clinic, and once when you are not at a hospital or clinic. Record the average of the two measurements. To check your blood pressure when you are not at a hospital or clinic, you can use:  An automated blood pressure machine at a pharmacy.  A home blood pressure monitor.  If you are between 24 years and 42 years old, ask your health care provider if you should take aspirin to prevent strokes.  Have regular diabetes screenings. This  involves taking a blood sample to check your fasting blood sugar level.  If you are at a normal weight and have a low risk for diabetes, have this test once every three years after 56 years of age.  If you are overweight and have a high risk for diabetes, consider being tested at a younger age or more often. PREVENTING INFECTION  Hepatitis B  If you have a higher risk for hepatitis B, you should be screened for this virus. You are considered at high risk for hepatitis B if:  You were born in a country where hepatitis B is common. Ask your health care provider which countries are considered high risk.  Your parents were born in a high-risk country, and you have not been immunized against hepatitis B (hepatitis B vaccine).  You have HIV or AIDS.  You use needles to inject street drugs.  You live with someone who has hepatitis B.  You have had sex with someone who has hepatitis B.  You get hemodialysis treatment.  You take certain medicines for conditions, including cancer, organ transplantation, and autoimmune conditions. Hepatitis C  Blood testing is recommended for:  Everyone born from [birth year] through 1966.  Anyone with known risk factors for hepatitis C. Sexually transmitted infections (STIs)  You should be screened for sexually transmitted infections (STIs) including gonorrhea and chlamydia if:  You are sexually active and are younger than 56 years of age.  You are older than 56 years of age and your health care provider tells you that you are at risk for this type of infection.  Your sexual activity has changed since you were last screened and you are at an increased risk for chlamydia or gonorrhea. Ask your health care provider if you are at risk.  If you do not have HIV, but are at risk, it may be recommended that you take a prescription medicine daily to prevent HIV infection. This is called pre-exposure prophylaxis (PrEP). You are considered at risk if:  You are  sexually active and do not regularly use condoms or know the HIV status of your partner(s).  You take drugs by injection.  You are sexually active with a partner who has HIV. Talk with your health care provider about whether you are at high risk of being infected with HIV. If you choose to begin PrEP, you should first be tested for HIV. You should then be tested every 3 months for as long as you are taking PrEP.  PREGNANCY   If you are premenopausal and you may become pregnant, ask your health care provider about preconception counseling.  If you may become pregnant, take 400 to 800 micrograms (mcg) of folic acid every day.  If you want to prevent pregnancy, talk to your  health care provider about birth control (contraception). OSTEOPOROSIS AND MENOPAUSE   Osteoporosis is a disease in which the bones lose minerals and strength with aging. This can result in serious bone fractures. Your risk for osteoporosis can be identified using a bone density scan.  If you are 52 years of age or older, or if you are at risk for osteoporosis and fractures, ask your health care provider if you should be screened.  Ask your health care provider whether you should take a calcium or vitamin D supplement to lower your risk for osteoporosis.  Menopause may have certain physical symptoms and risks.  Hormone replacement therapy may reduce some of these symptoms and risks. Talk to your health care provider about whether hormone replacement therapy is right for you.  HOME CARE INSTRUCTIONS   Schedule regular health, dental, and eye exams.  Stay current with your immunizations.   Do not use any tobacco products including cigarettes, chewing tobacco, or electronic cigarettes.  If you are pregnant, do not drink alcohol.  If you are breastfeeding, limit how much and how often you drink alcohol.  Limit alcohol intake to no more than 1 drink per day for nonpregnant women. One drink equals 12 ounces of beer, 5  ounces of wine, or 1 ounces of hard liquor.  Do not use street drugs.  Do not share needles.  Ask your health care provider for help if you need support or information about quitting drugs.  Tell your health care provider if you often feel depressed.  Tell your health care provider if you have ever been abused or do not feel safe at home.   This information is not intended to replace advice given to you by your health care provider. Make sure you discuss any questions you have with your health care provider.   Document Released: 09/20/2010 Document Revised: 03/28/2014 Document Reviewed: 02/06/2013 Elsevier Interactive Patient Education 2016 [employer].  Mammogram A mammogram is an X-ray of the breasts that is done to check for abnormal changes. This procedure can screen for and detect any changes that may suggest breast cancer. A mammogram can also identify other changes and variations in the breast, such as:  Inflammation of the breast tissue (mastitis).  An infected area that contains a collection of pus (abscess).  A fluid-filled sac (cyst).  Fibrocystic changes. This is when breast tissue becomes denser, which can make the tissue feel rope-like or uneven under the skin.  Tumors that are not cancerous (benign). LET [facility] CARE PROVIDER KNOW ABOUT:  Any allergies you have.  If you have breast implants.  If you have had previous breast disease, biopsy, or surgery.  If you are breastfeeding.  Any possibility that you could be pregnant, if this applies.  If you are younger than age 62.  If you have a family history of breast cancer. RISKS AND COMPLICATIONS Generally, this is a safe procedure. However, problems may occur, including:  Exposure to radiation. Radiation levels are very low with this test.  The results being misinterpreted.  The need for further tests.  The inability of the mammogram to detect certain cancers. BEFORE THE PROCEDURE  Schedule  your test about 1-2 weeks after your menstrual period. This is usually when your breasts are the least tender.  If you have had a mammogram done at a different facility in the past, get the mammogram X-rays or have them sent to your current exam facility in order to compare them.  Wash your breasts  and under your arms the day of the test.  Do not wear deodorants, perfumes, lotions, or powders anywhere on your body on the day of the test.  Remove any jewelry from your neck.  Wear clothes that you can change into and out of easily. PROCEDURE  You will undress from the waist up and put on a gown.  You will stand in front of the X-ray machine.  Each breast will be placed between two plastic or glass plates. The plates will compress your breast for a few seconds. Try to stay as relaxed as possible during the procedure. This does not cause any harm to your breasts and any discomfort you feel will be very brief.  X-rays will be taken from different angles of each breast. The procedure may vary among health care providers and hospitals. AFTER THE PROCEDURE  The mammogram will be examined by a specialist (radiologist).  You may need to repeat certain parts of the test, depending on the quality of the images. This is commonly done if the radiologist needs a better view of the breast tissue.  Ask when your test results will be ready. Make sure you get your test results.  You may resume your normal activities.   This information is not intended to replace advice given to you by your health care provider. Make sure you discuss any questions you have with your health care provider.   Document Released: 03/04/2000 Document Revised: 11/26/2014 Document Reviewed: 05/16/2014 Elsevier Interactive Patient Education [employer].

## 2015-11-14 NOTE — Telephone Encounter (Signed)
I would first make sure she is taking with food.  If she has upset taking with food would have her schedule follow up to discuss starting IM B12.

## 2015-11-16 NOTE — Progress Notes (Unsigned)
Colonoscopy report faxed from Dr. Benson [place] office Last colonoscopy 08/23/2012/ was normal  The next one is due in 5 years; 08/2017

## 2015-11-16 NOTE — Telephone Encounter (Signed)
Call to Taylor Ritter States she was taking the B12 with food. Agreed to schedule apt; fup made for 8:30 on 9/12.

## 2015-11-19 ENCOUNTER — Ambulatory Visit (INDEPENDENT_AMBULATORY_CARE_PROVIDER_SITE_OTHER): Payer: Medicare Other | Admitting: Family Medicine

## 2015-11-19 ENCOUNTER — Encounter: Payer: Self-pay | Admitting: Family Medicine

## 2015-11-19 VITALS — BP 136/80 | HR 92 | Temp 98.2°F | Resp 12 | Ht 66.0 in | Wt 255.2 lb

## 2015-11-19 DIAGNOSIS — N764 Abscess of vulva: Secondary | ICD-10-CM | POA: Diagnosis not present

## 2015-11-19 DIAGNOSIS — D4959 Neoplasm of unspecified behavior of other genitourinary organ: Secondary | ICD-10-CM | POA: Diagnosis not present

## 2015-11-19 MED ORDER — SULFAMETHOXAZOLE-TRIMETHOPRIM 800-160 MG PO TABS: 1.0000 | ORAL_TABLET | Freq: Two times a day (BID) | ORAL | 0 refills | Status: AC

## 2015-11-19 NOTE — Progress Notes (Signed)
Pre visit review using our clinic review tool, if applicable. No additional management support is needed unless otherwise documented below in the visit note. 

## 2015-11-19 NOTE — Patient Instructions (Signed)
  Taylor Ritter I have seen you today for an acute visit because your primary care provider was not available.   1. Vulvar neoplasm ? Sebaceous cyst, Bartholin gland cysts.     2. Abscess, vulva Local heat. Oral antibiotic treatment recommended. It is not ready to be lanced, please follow with your doctor or gynecologist in 3-7 days.  - sulfamethoxazole-trimethoprim (BACTRIM DS,SEPTRA DS) 800-160 MG tablet; Take 1 tablet by mouth 2 (two) times daily.  Dispense: 14 tablet; Refill: 0    Monitor for signs of worsening symptoms and seek immediate medical attention if any concerning/warning symptom as we discussed.

## 2015-11-19 NOTE — Progress Notes (Signed)
HPI:  ACUTE VISIT:  Chief Complaint  Patient presents with  . Cyst    paiful vaginal cyst    [name] is a 55 y.o. female, who is here today complaining of 7-10 days of intermittent painful lesion on external genital area, according to patient, started last week, started getting better but worse again this week.  Soreness is mild, constant, exacerbated by palpation and relieved by standing up/avoiding pressure.  She states that she always has a "knot" in this area, she has intermittent episodes of soreness and increase in size but usually resolves spontaneously. She denies any vaginal bleeding, vaginal discharge, trauma, or urinary symptoms. Denies sexual activity. She has not noted fever, chills, associated myalgias, arthralgias, or back pain. She has not used any OTC medication.    Review of Systems  Constitutional: Negative for activity change, appetite change, chills, fatigue, fever and unexpected weight change.  Respiratory: Negative for cough, shortness of breath and wheezing.   Cardiovascular: Negative for chest pain, palpitations and leg swelling.  Gastrointestinal: Negative for abdominal pain, nausea and vomiting.       No changes in bowel habits.  Genitourinary: Negative for decreased urine volume, dysuria, menstrual problem, vaginal bleeding and vaginal discharge.  Musculoskeletal:       No associated myalgias, lower back pain, or arthralgias.  Skin: Positive for rash. Negative for wound.  Neurological: Negative for syncope, weakness, numbness and headaches.  Hematological: Negative for adenopathy.      Current Outpatient Prescriptions on File Prior to Visit  Medication Sig Dispense Refill  . albuterol (PROVENTIL HFA;VENTOLIN HFA) 108 (90 BASE) MCG/ACT inhaler Inhale 1 puff into the lungs every 4 (four) hours as needed for wheezing or shortness of breath.    . ALPRAZolam (XANAX) 1 MG tablet Take 1 tablet (1 mg total) by mouth 3 (three) times daily  as needed. 90 tablet 3  . Azelastine HCl (ASTEPRO) 0.15 % SOLN Place 1 spray into the nose 2 (two) times daily.      . Bepotastine Besilate (BEPREVE OP) Place 1 drop into both eyes 2 (two) times daily.     . budesonide-formoterol (SYMBICORT) 160-4.5 MCG/ACT inhaler Inhale 1 puff into the lungs 2 (two) times daily.    Marland Kitchen buPROPion (WELLBUTRIN XL) 150 MG 24 hr tablet Take 3 tablets by mouth  once a day 270 tablet 2  . cyclobenzaprine (FLEXERIL) 10 MG tablet Take 1 tablet (10 mg total) by mouth 3 (three) times daily as needed. For muscle spasms 90 tablet 1  . diphenhydrAMINE (BENADRYL) 50 MG capsule Take 50 mg by mouth every 6 (six) hours as needed for allergies.     Marland Kitchen esomeprazole (NEXIUM) 40 MG capsule Take 1 capsule (40 mg total) by mouth 2 (two) times daily. Brand name only 180 capsule 1  . fexofenadine (ALLEGRA) 180 MG tablet Take 180 mg by mouth daily.      . fluticasone (VERAMYST) 27.5 MCG/SPRAY nasal spray Place 2 sprays into the nose daily.      . hydrochlorothiazide (HYDRODIURIL) 25 MG tablet Take 1 tablet by mouth  daily 90 tablet 2  . levETIRAcetam (KEPPRA) 250 MG tablet Take 500 mg by mouth 2 (two) times daily.    Marland Kitchen linaclotide (LINZESS) 145 MCG CAPS capsule Take 145 mcg by mouth 2 (two) times daily.    . meclizine (ANTIVERT) 25 MG tablet Take 1 tablet (25 mg total) by mouth 3 (three) times daily as needed for dizziness. 30 tablet 0  .  Mometasone Furo-Formoterol Fum (DULERA) 200-5 MCG/ACT AERO Inhale 1 puff into the lungs 2 (two) times daily.      . montelukast (SINGULAIR) 10 MG tablet Take 10 mg by mouth at bedtime.    . pilocarpine (SALAGEN) 5 MG tablet Take 5 mg by mouth as needed (dryness).     Marland Kitchen PRESCRIPTION MEDICATION Inject 1 vial into the muscle every 14 (fourteen) days. Allergy shot     . rifaximin (XIFAXAN) 550 MG TABS tablet Take 550 mg by mouth.    . topiramate (TOPAMAX) 50 MG tablet 50 mg. One tab in AM, three tabs in PM  Per Dr Maryjean Ka, pain management    . valACYclovir  (VALTREX) 500 MG tablet Take 1 tablet by mouth two  times daily 180 tablet 3  . VIIBRYD 40 MG TABS Take 1 tablet by mouth  daily 90 tablet 2  . vitamin B-12 (CYANOCOBALAMIN) 1000 MCG tablet Take 1 tablet (1,000 mcg total) by mouth daily. 30 tablet 0   No current facility-administered medications on file prior to visit.      Past Medical History:  Diagnosis Date  . Anxiety    uses xanax as needed   . Arthritis    osteoporosis  . Asthma    daily inhalers allegy shots.followed by [facility]  . Chronic kidney disease    see's kidney doctor for urine retention  . Complication of anesthesia   . Depression   . Fibromyalgia   . GERD (gastroesophageal reflux disease)    ,hx of stricture  . Headache(784.0)   . Hypertension    followed by [facility]  . Neuromuscular disorder (HCC)    numbness arms legs,feet   . Shortness of breath   . Sleep apnea    Allergies  Allergen Reactions  . Other Other (See Comments)    Nylon sutures had to remove to allow healing  Vicryl sutures  . Percocet [Oxycodone-Acetaminophen] Other (See Comments)    "makes her crazy"  . Codeine Hives  . Dye Fdc Red [Red Dye] Hives    Scan dye, broke out in hives all over once with a neck scan  . Morphine And Related Hives and Itching  . Vesicare [Solifenacin Succinate] Itching    Social History   Social History  . Marital status: Married    Spouse name: N/A  . Number of children: N/A  . Years of education: N/A   Social History Main Topics  . Smoking status: Never Smoker  . Smokeless tobacco: Never Used  . Alcohol use No  . Drug use: No  . Sexual activity: Yes    Birth control/ protection: Other-see comments     Comment: HYST   Other Topics Concern  . None   Social History Narrative  . None    Vitals:   11/19/15 1054  BP: 136/80  Pulse: 92  Resp: 12  Temp: 98.2 F (36.8 C)   Body mass index is 41.19 kg/m.      Physical Exam  Nursing note and  vitals reviewed. Constitutional: She appears well-developed. She does not appear ill. No distress.  HENT:  Head: Atraumatic.  Eyes: Conjunctivae are normal.  GI: Soft. She exhibits no mass. There is no tenderness.  Genitourinary:    There is tenderness on the right labia. There is no rash on the right labia. No erythema in the vagina. No vaginal discharge found.  Genitourinary Comments: Nodular lesions palpated in perineal area, right side, below major labium. No skin  changes appreciated, no edema, no fluctuant area. Lesion is mobile, tender, 2-2.5 cm.  Musculoskeletal: She exhibits no edema.  Skin: Skin is warm. No rash noted. No erythema.  Psychiatric: Her speech is normal.  Well groomed, good eye contact.      ASSESSMENT AND PLAN:     Srushti was seen today for cyst.  Diagnoses and all orders for this visit:  Vulvar neoplasm  ? Bartholin cyst , ? Sebaceous cyst. Lesion seems to be deep, it may need to be removed after acute process resolved.   Abscess, vulva  Not ready for I&D, no fluctuant area and mildly deep. ? Bartholin abscess. Oral abx recommended + local heat. I recommended following with PCP or gynecologists in 3-7 days to be sure it is resolving and to drain if appropriate.  -     sulfamethoxazole-trimethoprim (BACTRIM DS,SEPTRA DS) 800-160 MG tablet; Take 1 tablet by mouth 2 (two) times daily.      Return in about 5 days (around 11/24/2015) for vulvar abscess (PCP or gyn).     -Ms.VALESHA SKOTNICKI advised to return or notify a doctor immediately if symptoms worsen or persist or new concerns arise.       Tewana Bohlen G. [place], MD  [facility]. [place] office.

## 2015-11-20 ENCOUNTER — Telehealth: Payer: Self-pay | Admitting: Family Medicine

## 2015-11-20 DIAGNOSIS — N750 Cyst of Bartholin's gland: Secondary | ICD-10-CM

## 2015-11-20 NOTE — Telephone Encounter (Signed)
Pt needs a referral to see dr Everett Graff gyn .[phone] The pt has [facility] medic[employer] advantage ppo. Pt cyst bartholin gland cyst

## 2015-11-21 NOTE — Telephone Encounter (Signed)
OK to send referral

## 2015-11-24 NOTE — Telephone Encounter (Signed)
Referral was placed and patient notified.

## 2015-12-01 ENCOUNTER — Ambulatory Visit (INDEPENDENT_AMBULATORY_CARE_PROVIDER_SITE_OTHER): Payer: Medicare Other | Admitting: Family Medicine

## 2015-12-01 VITALS — BP 110/80 | HR 82 | Temp 97.8°F | Ht 66.0 in | Wt 252.0 lb

## 2015-12-01 DIAGNOSIS — E538 Deficiency of other specified B group vitamins: Secondary | ICD-10-CM | POA: Diagnosis not present

## 2015-12-01 DIAGNOSIS — F32A Depression, unspecified: Secondary | ICD-10-CM

## 2015-12-01 DIAGNOSIS — K219 Gastro-esophageal reflux disease without esophagitis: Secondary | ICD-10-CM | POA: Diagnosis not present

## 2015-12-01 DIAGNOSIS — I10 Essential (primary) hypertension: Secondary | ICD-10-CM

## 2015-12-01 DIAGNOSIS — F329 Major depressive disorder, single episode, unspecified: Secondary | ICD-10-CM

## 2015-12-01 MED ORDER — CYANOCOBALAMIN 1000 MCG/ML IJ SOLN
1000.0000 ug | Freq: Once | INTRAMUSCULAR | Status: AC
  Administered 2015-12-01: 1000 ug via INTRAMUSCULAR

## 2015-12-01 NOTE — Progress Notes (Signed)
Subjective:     Patient ID: Taylor Ritter, female   DOB: [birth year], 55 y.o.   MRN: [id number]  HPI Patient seen for routine medical follow-up. She has history of hypertension, obesity, asthma, GERD, past history of esophageal stricture, recurrent cold sores, B12 deficiency, chronic back pain followed by pain management, and history of recurrent depression. She's been followed by psychiatry in the past. Her depression is stable.  Patient also has chronic constipation and takes Linzess.  She is followed by pain management for chronic back pain is currently treated with Topamax and Breath.  History of hyperglycemia. Last A1c 5.8%. She has history of GERD which is stable on Nexium. No recent dysphagia. She feels that her oral B12 was worsening constipation and is requesting going back on intramuscular B12 replacement. She requests getting first injection here today.  She had flu vaccine 1 week ago  Past Medical History:  Diagnosis Date  . Anxiety    uses xanax as needed   . Arthritis    osteoporosis  . Asthma    daily inhalers allegy shots.followed by [facility]  . Chronic kidney disease    see's kidney doctor for urine retention  . Complication of anesthesia   . Depression   . Fibromyalgia   . GERD (gastroesophageal reflux disease)    ,hx of stricture  . Headache(784.0)   . Hypertension    followed by [facility]  . Neuromuscular disorder (HCC)    numbness arms legs,feet   . Shortness of breath   . Sleep apnea    Past Surgical History:  Procedure Laterality Date  . ABDOMINAL HYSTERECTOMY    . ANTERIOR FUSION CERVICAL SPINE    . BACK SURGERY  2012  . CARPAL TUNNEL RELEASE     R&L  . CESAREAN SECTION  1983  . CHOLECYSTECTOMY    . ESOPHAGEAL DILATION    . KNEE ARTHROSCOPY     L KNEE  . LUMBAR LAMINECTOMY    . LUMBAR LAMINECTOMY/DECOMPRESSION MICRODISCECTOMY  02/25/2011   Procedure: LUMBAR LAMINECTOMY/DECOMPRESSION MICRODISCECTOMY;  Surgeon:  Floyce Stakes;  Location: [facility];  Service: Neurosurgery;  Laterality: N/A;  Lumbar Four-Five Microdiscectomy  . LUMBAR WOUND DEBRIDEMENT  03/18/2011   Procedure: LUMBAR WOUND DEBRIDEMENT;  Surgeon: Floyce Stakes;  Location: [place] [facility];  Service: Neurosurgery;  Laterality: N/A;  Exploration of Lumbar wound  . SEPTOPLASTY  10+yrs ago    reports that she has never smoked. She has never used smokeless tobacco. She reports that she does not drink alcohol or use drugs. family history includes Arthritis in her father and mother; COPD in her mother; Cancer in her father, maternal grandfather, and maternal grandmother; Diabetes in her father, maternal grandfather, and mother; Early death in her brother; Heart disease in her father; Heart disease (age of onset: 39) in her mother; Hypertension in her mother; Kidney disease in her mother; Mental illness in her father and mother. Allergies  Allergen Reactions  . Other Other (See Comments)    Nylon sutures had to remove to allow healing  Vicryl sutures  . Percocet [Oxycodone-Acetaminophen] Other (See Comments)    "makes her crazy"  . Codeine Hives  . Dye Fdc Red [Red Dye] Hives    Scan dye, broke out in hives all over once with a neck scan  . Morphine And Related Hives and Itching  . Vesicare [Solifenacin Succinate] Itching     Review of Systems  Constitutional: Negative for fatigue and unexpected weight change.  Eyes: Negative for visual disturbance.  Respiratory: Negative for cough, chest tightness, shortness of breath and wheezing.   Cardiovascular: Negative for chest pain, palpitations and leg swelling.  Gastrointestinal: Positive for constipation.  Endocrine: Negative for polydipsia and polyuria.  Genitourinary: Negative for dysuria.  Musculoskeletal: Positive for arthralgias, back pain and neck pain.  Neurological: Negative for dizziness, seizures, syncope, weakness, light-headedness and headaches.       Objective:    Physical Exam  Constitutional: She is oriented to person, place, and time. She appears well-developed and well-nourished.  Neck: Neck supple. No thyromegaly present.  Cardiovascular: Normal rate and regular rhythm.   Pulmonary/Chest: Effort normal and breath sounds normal. No respiratory distress. She has no wheezes. She has no rales.  Musculoskeletal: She exhibits no edema.  Lymphadenopathy:    She has no cervical adenopathy.  Neurological: She is alert and oriented to person, place, and time.  Psychiatric: She has a normal mood and affect. Her behavior is normal.       Assessment:     #1 history of B12 deficiency probably related to chronic PPI use. Reported constipation with oral B12 replacement  #2 history of mild hyperglycemia. Patient's home blood sugars been very stable  #3 chronic back pain followed by pain management  #4 GERD stable on Nexium  #5 history of chronic anxiety and recurrent depression currently stable    Plan:     -B12 injection given and she'll get back on cycle of monthly B12. We gave her option of having someone give this at home if she prefers and she is undecided at this point -Continue current medications. -She is encouraged to lose some weight -Routine follow-up in 6 months  Eulas Post MD [facility]

## 2015-12-01 NOTE — Patient Instructions (Signed)
Get back on monthly B12 injections Let me know if you have any desire to have Christy do home injections. If so, we can prescribe B12 vial along with the necessary supplies.

## 2015-12-01 NOTE — Progress Notes (Signed)
Pre visit review using our clinic review tool, if applicable. No additional management support is needed unless otherwise documented below in the visit note. 

## 2015-12-08 NOTE — Pre-Procedure Instructions (Signed)
HALIMA DARRIGO  12/08/2015      [id number] - [place], [place] [address] [place] [address] Phone: [phone] Fax: [place], [facility] [address] [place] [address] [place] [address] Phone: [phone] Fax: [phone]    Your procedure is scheduled on Fri, Sept 22 @ 7:30 AM  Report to [facility] Admitting at 5:30 AM  Call this number if you have problems the morning of surgery:  [phone]   Remember:  Do not eat food or drink liquids after midnight.  Take these medicines the morning of surgery with A SIP OF WATER Albuterol<Bring Your Inhaler With You>,Alprazolam(Xanax),Nasal Spray,Eye Drops,Wellbutrin(Bupropion),Nexium (Esomeprazole),Allegra(Fexofenadine),Keppra (Levetiracetam),and Valtrex(Valacyclovir)              No Goody's,BC's,Aleve,Advil,Motrin,Ibuprofen,Fish Oil,or any Herbal Medications.    Do not wear jewelry, make-up or nail polish.  Do not wear lotions, powders,perfumes, or deoderant.  Do not shave 48 hours prior to surgery.    Do not bring valuables to the hospital.  [facility] is not responsible for any belongings or valuables.  Contacts, dentures or bridgework may not be worn into surgery.  Leave your suitcase in the car.  After surgery it may be brought to your room.  For patients admitted to the hospital, discharge time will be determined by your treatment team.  Patients discharged the day of surgery will not be allowed to drive home.    [facility]  Before surgery, you can play an important role.  Because skin is not sterile, your skin needs to be as free of germs as possible.  You can reduce the number of germs on you skin by washing with CHG (chlorahexidine gluconate) soap before surgery.  CHG is an antiseptic cleaner which kills germs and bonds with the skin to continue killing germs  even after washing.  Please DO NOT use if you have an allergy to CHG or antibacterial soaps.  If your skin becomes reddened/irritated stop using the CHG and inform your nurse when you arrive at Short Stay.  Do not shave (including legs and underarms) for at least 48 hours prior to the first CHG shower.  You may shave your face.  Please follow these instructions carefully:   1.  Shower with CHG Soap the night before surgery and the                                morning of Surgery.  2.  If you choose to wash your hair, wash your hair first as usual with your       normal shampoo.  3.  After you shampoo, rinse your hair and body thoroughly to remove the                      Shampoo.  4.  Use CHG as you would any other liquid soap.  You can apply chg directly       to the skin and wash gently with scrungie or a clean washcloth.  5.  Apply the CHG Soap to your body ONLY FROM THE NECK DOWN.        Do not use on open wounds or open sores.  Avoid contact with your eyes,       ears, mouth and genitals (private parts).  Wash genitals (private parts)       with your normal soap.  6.  Wash thoroughly, paying special attention to the area where your surgery        will be performed.  7.  Thoroughly rinse your body with warm water from the neck down.  8.  DO NOT shower/wash with your normal soap after using and rinsing off       the CHG Soap.  9.  Pat yourself dry with a clean towel.            10.  Wear clean pajamas.            11.  Place clean sheets on your bed the night of your first shower and do not        sleep with pets.  Day of Surgery  Do not apply any lotions/deoderants the morning of surgery.  Please wear clean clothes to the hospital/surgery center.    Please read over the following fact sheets that you were given. Pain Booklet, Coughing and Deep Breathing and MRSA Information

## 2015-12-08 NOTE — H&P (Signed)
Taylor Ritter is an 55 y.o. female.   Chief Complaint: back and leg pain HPI: Patient with history of multiple lumbar surgeries, still persistent back pain and radicular symptoms despite what appears to be surgical correction.  She Hasundergone multiple interventional approaches, and trials of different medicines without substantial benefit. In August, she underwent SCS trial. She reported better than 50% improvement in her back pain and leg symptoms.  She was able to do routine housecleaning activities, and other activities that she was previously unable to because of exacerbations of pain.  he was able to reduce her medication somewhat.  Overall though she was quite pleased with her outcome, and is requesting permanent implantation.   Past Medical History:  Diagnosis Date  . Anxiety    uses xanax as needed   . Arthritis    osteoporosis  . Asthma    daily inhalers allegy shots.followed by [facility]  . Chronic kidney disease    see's kidney doctor for urine retention  . Complication of anesthesia   . Depression   . Fibromyalgia   . GERD (gastroesophageal reflux disease)    ,hx of stricture  . Headache(784.0)   . Hypertension    followed by [facility]  . Neuromuscular disorder (HCC)    numbness arms legs,feet   . Shortness of breath   . Sleep apnea     Past Surgical History:  Procedure Laterality Date  . ABDOMINAL HYSTERECTOMY    . ANTERIOR FUSION CERVICAL SPINE    . BACK SURGERY  2012  . CARPAL TUNNEL RELEASE     R&L  . CESAREAN SECTION  1983  . CHOLECYSTECTOMY    . ESOPHAGEAL DILATION    . KNEE ARTHROSCOPY     L KNEE  . LUMBAR LAMINECTOMY    . LUMBAR LAMINECTOMY/DECOMPRESSION MICRODISCECTOMY  02/25/2011   Procedure: LUMBAR LAMINECTOMY/DECOMPRESSION MICRODISCECTOMY;  Surgeon: Floyce Stakes;  Location: [facility];  Service: Neurosurgery;  Laterality: N/A;  Lumbar Four-Five Microdiscectomy  . LUMBAR WOUND DEBRIDEMENT  03/18/2011   Procedure: LUMBAR WOUND DEBRIDEMENT;  Surgeon: Floyce Stakes;  Location: [place] [facility];  Service: Neurosurgery;  Laterality: N/A;  Exploration of Lumbar wound  . SEPTOPLASTY  10+yrs ago    Family History  Problem Relation Age of Onset  . Arthritis Mother   . Hypertension Mother   . Kidney disease Mother   . COPD Mother   . Heart disease Mother 30    CAD  . Diabetes Mother   . Mental illness Mother   . Arthritis Father   . Diabetes Father   . Heart disease Father   . Cancer Father     prostate cancer  . Mental illness Father     Alzheimers Dementia  . Cancer Maternal Grandmother     brain tumor  . Cancer Maternal Grandfather     lung  . Diabetes Maternal Grandfather   . Early death Brother   . Anesthesia problems Neg Hx   . Hypotension Neg Hx   . Malignant hyperthermia Neg Hx   . Pseudochol deficiency Neg Hx    Social History:  reports that she has never smoked. She has never used smokeless tobacco. She reports that she does not drink alcohol or use drugs.  Allergies:  Allergies  Allergen Reactions  . Other Other (See Comments)    Nylon sutures had to remove to allow healing  Vicryl sutures - do not use  . Percocet [Oxycodone-Acetaminophen] Other (See Comments)    "  makes her crazy"  . Codeine Hives  . Dye Fdc Red [Red Dye] Hives    Scan dye, broke out in hives all over once with a neck scan  . Morphine And Related Hives and Itching  . Vesicare [Solifenacin Succinate] Itching    Medications Prior to Admission  Medication Sig Dispense Refill  . albuterol (PROVENTIL HFA;VENTOLIN HFA) 108 (90 BASE) MCG/ACT inhaler Inhale 1 puff into the lungs every 4 (four) hours as needed for wheezing or shortness of breath.    . ALPRAZolam (XANAX) 1 MG tablet Take 1 tablet (1 mg total) by mouth 3 (three) times daily as needed. 90 tablet 3  . Azelastine HCl (ASTEPRO) 0.15 % SOLN Place 1 spray into the nose 2 (two) times daily.      . Bepotastine Besilate (BEPREVE OP) Place 1 drop  into both eyes 2 (two) times daily as needed.     Marland Kitchen buPROPion (WELLBUTRIN XL) 150 MG 24 hr tablet Take 3 tablets by mouth  once a day 270 tablet 2  . esomeprazole (NEXIUM) 40 MG capsule Take 1 capsule (40 mg total) by mouth 2 (two) times daily. Brand name only 180 capsule 1  . fexofenadine (ALLEGRA) 180 MG tablet Take 180 mg by mouth daily.      . fluticasone (VERAMYST) 27.5 MCG/SPRAY nasal spray Place 2 sprays into the nose daily.      . hydrochlorothiazide (HYDRODIURIL) 25 MG tablet Take 1 tablet by mouth  daily 90 tablet 2  . levETIRAcetam (KEPPRA) 250 MG tablet Take 500 mg by mouth 2 (two) times daily.    Marland Kitchen linaclotide (LINZESS) 145 MCG CAPS capsule Take 145 mcg by mouth 2 (two) times daily as needed.     . Mometasone Furo-Formoterol Fum (DULERA) 200-5 MCG/ACT AERO Inhale 1 puff into the lungs 2 (two) times daily.      . montelukast (SINGULAIR) 10 MG tablet Take 10 mg by mouth at bedtime.    Marland Kitchen PRESCRIPTION MEDICATION Inject 1 vial into the muscle every 14 (fourteen) days. Allergy shot     . topiramate (TOPAMAX) 50 MG tablet Take 50-100 mg by mouth See admin instructions. 1 tab in AM, 2 tabs in PM  Per Dr Maryjean Ka, pain management    . valACYclovir (VALTREX) 500 MG tablet Take 1 tablet by mouth two  times daily 180 tablet 3  . VIIBRYD 40 MG TABS Take 1 tablet by mouth  daily 90 tablet 2  . cyclobenzaprine (FLEXERIL) 10 MG tablet Take 1 tablet (10 mg total) by mouth 3 (three) times daily as needed. For muscle spasms 90 tablet 1  . diphenhydrAMINE (BENADRYL) 50 MG capsule Take 50 mg by mouth every 6 (six) hours as needed for allergies.     . pilocarpine (SALAGEN) 5 MG tablet Take 5 mg by mouth as needed (dryness).       Results for orders placed or performed during the hospital encounter of 12/09/15 (from the past 48 hour(s))  Surgical pcr screen     Status: None   Collection Time: 12/09/15  8:38 AM  Result Value Ref Range   MRSA, PCR NEGATIVE NEGATIVE   Staphylococcus aureus NEGATIVE  NEGATIVE    Comment:        The Xpert SA Assay (FDA approved for NASAL specimens in patients over 15 years of age), is one component of a comprehensive surveillance program.  Test performance has been validated by [facility] for patients greater than or equal to 85 year old. It is  not intended to diagnose infection nor to guide or monitor treatment.   Basic metabolic panel     Status: Abnormal   Collection Time: 12/09/15  8:53 AM  Result Value Ref Range   Sodium 142 135 - 145 mmol/L   Potassium 3.1 (L) 3.5 - 5.1 mmol/L   Chloride 105 101 - 111 mmol/L   CO2 25 22 - 32 mmol/L   Glucose, Bld 116 (H) 65 - 99 mg/dL   BUN 13 6 - 20 mg/dL   Creatinine, Ser 1.11 (H) 0.44 - 1.00 mg/dL   Calcium 9.3 8.9 - 10.3 mg/dL   GFR calc non Af Amer 54 (L) >60 mL/min   GFR calc Af Amer >60 >60 mL/min    Comment: (NOTE) The eGFR has been calculated using the CKD EPI equation. This calculation has not been validated in all clinical situations. eGFR's persistently <60 mL/min signify possible Chronic Kidney Disease.    Anion gap 12 5 - 15  CBC     Status: None   Collection Time: 12/09/15  8:53 AM  Result Value Ref Range   WBC 7.2 4.0 - 10.5 K/uL   RBC 4.15 3.87 - 5.11 MIL/uL   Hemoglobin 12.5 12.0 - 15.0 g/dL   HCT 39.7 36.0 - 46.0 %   MCV 95.7 78.0 - 100.0 fL   MCH 30.1 26.0 - 34.0 pg   MCHC 31.5 30.0 - 36.0 g/dL   RDW 12.9 11.5 - 15.5 %   Platelets 236 150 - 400 K/uL   No results found.  Review of Systems  Constitutional: Negative.   HENT: Negative.   Eyes: Negative.   Respiratory: Negative.   Cardiovascular: Negative.   Gastrointestinal: Negative.   Genitourinary: Negative.   Musculoskeletal: Positive for back pain and neck pain. Negative for falls, joint pain and myalgias.  Skin: Negative.   Neurological: Negative.   Endo/Heme/Allergies: Negative.   Psychiatric/Behavioral: Negative.     Blood pressure (!) 120/57, pulse 86, temperature 98.3 F (36.8 C), temperature source  Oral, resp. rate 16, weight 114.3 kg (252 lb), SpO2 97 %. Physical Exam  Constitutional: She is oriented to person, place, and time. She appears well-developed and well-nourished.  HENT:  Head: Normocephalic and atraumatic.  Eyes: Pupils are equal, round, and reactive to light.  Neck: Normal range of motion.  Cardiovascular: Normal rate and regular rhythm.   Respiratory: Effort normal.  Neurological: She is alert and oriented to person, place, and time.  Skin: Skin is warm and dry.  Psychiatric: She has a normal mood and affect.     Assessment/Plan A: chronic pain; lumbar post-laminectomy syndrome P: permanent implant SCS  Bonna Gains, MD 12/11/2015, 7:38 AM

## 2015-12-09 ENCOUNTER — Encounter (HOSPITAL_COMMUNITY)
Admission: RE | Admit: 2015-12-09 | Discharge: 2015-12-09 | Disposition: A | Discharge status: Home / Self Care | Payer: Medicare Other | Source: Ambulatory Visit | Attending: Anesthesiology | Admitting: Anesthesiology

## 2015-12-09 ENCOUNTER — Encounter (HOSPITAL_COMMUNITY): Payer: Self-pay

## 2015-12-09 ENCOUNTER — Ambulatory Visit (INDEPENDENT_AMBULATORY_CARE_PROVIDER_SITE_OTHER): Payer: Medicare Other | Admitting: Obstetrics and Gynecology

## 2015-12-09 ENCOUNTER — Encounter: Payer: Self-pay | Admitting: Obstetrics and Gynecology

## 2015-12-09 VITALS — BP 122/70 | HR 80 | Resp 15 | Ht 66.0 in | Wt 252.0 lb

## 2015-12-09 DIAGNOSIS — Z8249 Family history of ischemic heart disease and other diseases of the circulatory system: Secondary | ICD-10-CM | POA: Diagnosis not present

## 2015-12-09 DIAGNOSIS — N764 Abscess of vulva: Secondary | ICD-10-CM | POA: Diagnosis not present

## 2015-12-09 DIAGNOSIS — K219 Gastro-esophageal reflux disease without esophagitis: Secondary | ICD-10-CM | POA: Diagnosis not present

## 2015-12-09 DIAGNOSIS — L720 Epidermal cyst: Secondary | ICD-10-CM | POA: Diagnosis not present

## 2015-12-09 DIAGNOSIS — R6882 Decreased libido: Secondary | ICD-10-CM | POA: Diagnosis not present

## 2015-12-09 DIAGNOSIS — F329 Major depressive disorder, single episode, unspecified: Secondary | ICD-10-CM | POA: Diagnosis not present

## 2015-12-09 DIAGNOSIS — G4730 Sleep apnea, unspecified: Secondary | ICD-10-CM | POA: Diagnosis not present

## 2015-12-09 DIAGNOSIS — I129 Hypertensive chronic kidney disease with stage 1 through stage 4 chronic kidney disease, or unspecified chronic kidney disease: Secondary | ICD-10-CM | POA: Diagnosis not present

## 2015-12-09 DIAGNOSIS — J45909 Unspecified asthma, uncomplicated: Secondary | ICD-10-CM | POA: Diagnosis not present

## 2015-12-09 DIAGNOSIS — M5416 Radiculopathy, lumbar region: Secondary | ICD-10-CM | POA: Diagnosis not present

## 2015-12-09 DIAGNOSIS — M961 Postlaminectomy syndrome, not elsewhere classified: Secondary | ICD-10-CM | POA: Diagnosis not present

## 2015-12-09 DIAGNOSIS — M797 Fibromyalgia: Secondary | ICD-10-CM | POA: Diagnosis not present

## 2015-12-09 DIAGNOSIS — F419 Anxiety disorder, unspecified: Secondary | ICD-10-CM | POA: Diagnosis not present

## 2015-12-09 DIAGNOSIS — G8929 Other chronic pain: Secondary | ICD-10-CM | POA: Diagnosis present

## 2015-12-09 DIAGNOSIS — M1990 Unspecified osteoarthritis, unspecified site: Secondary | ICD-10-CM | POA: Diagnosis not present

## 2015-12-09 DIAGNOSIS — Z6841 Body Mass Index (BMI) 40.0 and over, adult: Secondary | ICD-10-CM | POA: Diagnosis not present

## 2015-12-09 DIAGNOSIS — N189 Chronic kidney disease, unspecified: Secondary | ICD-10-CM | POA: Diagnosis not present

## 2015-12-09 HISTORY — DX: Anemia, unspecified: D64.9

## 2015-12-09 HISTORY — DX: Irritable bowel syndrome, unspecified: K58.9

## 2015-12-09 HISTORY — DX: Edema, unspecified: R60.9

## 2015-12-09 HISTORY — DX: Other chronic pain: G89.29

## 2015-12-09 HISTORY — DX: Allergy, unspecified, initial encounter: T78.40XA

## 2015-12-09 HISTORY — DX: Pain, unspecified: R52

## 2015-12-09 HISTORY — DX: Personal history of colon polyps, unspecified: Z86.0100

## 2015-12-09 HISTORY — DX: Frequency of micturition: R35.0

## 2015-12-09 HISTORY — DX: Localized edema: R60.0

## 2015-12-09 HISTORY — DX: Urgency of urination: R39.15

## 2015-12-09 HISTORY — DX: Personal history of colonic polyps: Z86.010

## 2015-12-09 HISTORY — DX: Insomnia, unspecified: G47.00

## 2015-12-09 HISTORY — DX: Other muscle spasm: M62.838

## 2015-12-09 HISTORY — DX: Personal history of other diseases of the respiratory system: Z87.09

## 2015-12-09 HISTORY — DX: Nocturia: R35.1

## 2015-12-09 HISTORY — DX: Pain in unspecified joint: M25.50

## 2015-12-09 HISTORY — DX: Unspecified urinary incontinence: R32

## 2015-12-09 HISTORY — DX: Dorsalgia, unspecified: M54.9

## 2015-12-09 HISTORY — DX: Personal history of other specified conditions: Z87.898

## 2015-12-09 HISTORY — DX: Age-related osteoporosis without current pathological fracture: M81.0

## 2015-12-09 LAB — BASIC METABOLIC PANEL
ANION GAP: 12 (ref 5–15)
BUN: 13 mg/dL (ref 6–20)
CO2: 25 mmol/L (ref 22–32)
Calcium: 9.3 mg/dL (ref 8.9–10.3)
Chloride: 105 mmol/L (ref 101–111)
Creatinine, Ser: 1.11 mg/dL — ABNORMAL HIGH (ref 0.44–1.00)
GFR calc Af Amer: >60 mL/min (ref >60)
GFR calc non Af Amer: 54 mL/min — ABNORMAL LOW (ref >60)
GLUCOSE: 116 mg/dL — AB (ref 65–99)
POTASSIUM: 3.1 mmol/L — AB (ref 3.5–5.1)
Sodium: 142 mmol/L (ref 135–145)

## 2015-12-09 LAB — CBC
HEMATOCRIT: 39.7 % (ref 36.0–46.0)
HEMOGLOBIN: 12.5 g/dL (ref 12.0–15.0)
MCH: 30.1 pg (ref 26.0–34.0)
MCHC: 31.5 g/dL (ref 30.0–36.0)
MCV: 95.7 fL (ref 78.0–100.0)
Platelets: 236 10*3/uL (ref 150–400)
RBC: 4.15 MIL/uL (ref 3.87–5.11)
RDW: 12.9 % (ref 11.5–15.5)
WBC: 7.2 10*3/uL (ref 4.0–10.5)

## 2015-12-09 LAB — SURGICAL PCR SCREEN
MRSA, PCR: NEGATIVE
Staphylococcus aureus: NEGATIVE

## 2015-12-09 MED ORDER — CHLORHEXIDINE GLUCONATE CLOTH 2 % EX PADS: 6.0000 | MEDICATED_PAD | Freq: Once | CUTANEOUS | Status: DC

## 2015-12-09 NOTE — Progress Notes (Signed)
55 y.o. G1P1001 MarriedCaucasianF sent for a consultation from Dr Betty Martinique for evaluation of a vulvar lesion (possible bartholin's).  The patient c/o a lump that comes and goes on her right vulva for years (at least 10-15). She was treated with antibiotics a few weeks ago and it shrunk down to very small. In the last year it seems to get inflamed more often. She usually treats it with hot compresses. Never drained.  She has a warty lesion on the left vulva, doesn't change size, no pain. There as long as she can remember.  She has a h/o vaginal hysterectomy (still has her ovaries).  She c/o no sex drive. No sex for years. Married, she has a 42 year old grandchild who lives with her. She has raised 2 other grandchildren. She has one daughter and one step-daughter (raised her). Raising her step-daughters kids, she is on drugs.     No LMP recorded. Patient has had a hysterectomy.          Sexually active: no The current method of family planning is status post hysterectomy.    Exercising: No.  The patient does not participate in regular exercise at present. Smoker:  no  Health Maintenance: Pap:  2013 WNL per paptient History of abnormal Pap:  no MMG:  11-07-14 WNL  Colonoscopy:  08-23-12 polyps repeat in 5 years BMD:   Never TDaP:  08-09-12 Gardasil: N/A   reports that she has never smoked. She has never used smokeless tobacco. She reports that she does not drink alcohol or use drugs.  Past Medical History:  Diagnosis Date  . Allergy    takes Allegra daily and uses nasal spray daily.Takes Benadryl as needed.Uses eye drops daily  . Anemia    B 12 shots monthly  . Anxiety    takes Xanax as needed  . Arthritis    osteoporosis  . Asthma    Proventil inhaler as needed  . Chronic back pain    DDD and stenosis  . Complication of anesthesia    pt states she was woke up during surgery  . Depression    takes Wellbutrin daily  . Fibromyalgia   . GERD (gastroesophageal reflux disease)    takes Nexium daily  . Headache(784.0)    takes Topamax daily as needed  . History of bronchitis    2016  . History of colon polyps    benign  . History of vertigo    took Meclizine several yrs ago  . Hypertension    followed by [facility]  . IBS (irritable bowel syndrome)    takes LInzess daily  . Insomnia   . Joint pain   . Muscle spasm    takes Flexeril daily as needed  . Neuromuscular disorder (HCC)    numbness arms legs,feet   . Nocturia   . Osteoporosis   . Pain    takes Keppra and Topamax daily  . Peripheral edema    takes HCTZ daily  . Sleep apnea    has a CPAP but doesn't wear often  . Urinary frequency   . Urinary incontinence   . Urinary urgency     Past Surgical History:  Procedure Laterality Date  . ABDOMINAL HYSTERECTOMY    . ANTERIOR FUSION CERVICAL SPINE    . BACK SURGERY  2012  . bladder tacked    . CARPAL TUNNEL RELEASE     R&L  . CESAREAN SECTION  1983  . CHOLECYSTECTOMY    .  COLONOSCOPY    . ESOPHAGEAL DILATION    . KNEE ARTHROSCOPY     L KNEE  . LUMBAR LAMINECTOMY    . LUMBAR LAMINECTOMY/DECOMPRESSION MICRODISCECTOMY  02/25/2011   Procedure: LUMBAR LAMINECTOMY/DECOMPRESSION MICRODISCECTOMY;  Surgeon: Floyce Stakes;  Location: [place] [facility];  Service: Neurosurgery;  Laterality: N/A;  Lumbar Four-Five Microdiscectomy  . LUMBAR WOUND DEBRIDEMENT  03/18/2011   Procedure: LUMBAR WOUND DEBRIDEMENT;  Surgeon: Floyce Stakes;  Location: [place] [facility];  Service: Neurosurgery;  Laterality: N/A;  Exploration of Lumbar wound  . SEPTOPLASTY  10+yrs ago    Current Outpatient Prescriptions  Medication Sig Dispense Refill  . albuterol (PROVENTIL HFA;VENTOLIN HFA) 108 (90 BASE) MCG/ACT inhaler Inhale 1 puff into the lungs every 4 (four) hours as needed for wheezing or shortness of breath.    . ALPRAZolam (XANAX) 1 MG tablet Take 1 tablet (1 mg total) by mouth 3 (three) times daily as needed. 90 tablet 3  . Azelastine HCl (ASTEPRO)  0.15 % SOLN Place 1 spray into the nose 2 (two) times daily.      . Bepotastine Besilate (BEPREVE OP) Place 1 drop into both eyes 2 (two) times daily as needed.     Marland Kitchen buPROPion (WELLBUTRIN XL) 150 MG 24 hr tablet Take 3 tablets by mouth  once a day 270 tablet 2  . cyclobenzaprine (FLEXERIL) 10 MG tablet Take 1 tablet (10 mg total) by mouth 3 (three) times daily as needed. For muscle spasms 90 tablet 1  . diphenhydrAMINE (BENADRYL) 50 MG capsule Take 50 mg by mouth every 6 (six) hours as needed for allergies.     Marland Kitchen esomeprazole (NEXIUM) 40 MG capsule Take 1 capsule (40 mg total) by mouth 2 (two) times daily. Brand name only 180 capsule 1  . fexofenadine (ALLEGRA) 180 MG tablet Take 180 mg by mouth daily.      . fluticasone (VERAMYST) 27.5 MCG/SPRAY nasal spray Place 2 sprays into the nose daily.      . hydrochlorothiazide (HYDRODIURIL) 25 MG tablet Take 1 tablet by mouth  daily 90 tablet 2  . levETIRAcetam (KEPPRA) 250 MG tablet Take 500 mg by mouth 2 (two) times daily.    Marland Kitchen linaclotide (LINZESS) 145 MCG CAPS capsule Take 145 mcg by mouth 2 (two) times daily as needed.     . Mometasone Furo-Formoterol Fum (DULERA) 200-5 MCG/ACT AERO Inhale 1 puff into the lungs 2 (two) times daily.      . montelukast (SINGULAIR) 10 MG tablet Take 10 mg by mouth at bedtime.    . pilocarpine (SALAGEN) 5 MG tablet Take 5 mg by mouth as needed (dryness).     Marland Kitchen PRESCRIPTION MEDICATION Inject 1 vial into the muscle every 14 (fourteen) days. Allergy shot     . topiramate (TOPAMAX) 50 MG tablet Take 50-100 mg by mouth See admin instructions. 1 tab in AM, 2 tabs in PM  Per Dr Maryjean Ka, pain management    . valACYclovir (VALTREX) 500 MG tablet Take 1 tablet by mouth two  times daily 180 tablet 3  . VIIBRYD 40 MG TABS Take 1 tablet by mouth  daily 90 tablet 2   No current facility-administered medications for this visit.    Facility-Administered Medications Ordered in Other Visits  Medication Dose Route Frequency Provider  Last Rate Last Dose  . Chlorhexidine Gluconate Cloth 2 % PADS 6 each  6 each Topical Once Clydell Hakim, MD       And  . Chlorhexidine Gluconate Cloth 2 %  PADS 6 each  6 each Topical Once Clydell Hakim, MD        Family History  Problem Relation Age of Onset  . Arthritis Mother   . Hypertension Mother   . Kidney disease Mother   . COPD Mother   . Heart disease Mother 34    CAD  . Diabetes Mother   . Mental illness Mother   . Arthritis Father   . Diabetes Father   . Heart disease Father   . Cancer Father     prostate cancer  . Mental illness Father     Alzheimers Dementia  . Cancer Maternal Grandmother     brain tumor  . Cancer Maternal Grandfather     lung  . Diabetes Maternal Grandfather   . Early death Brother   . Anesthesia problems Neg Hx   . Hypotension Neg Hx   . Malignant hyperthermia Neg Hx   . Pseudochol deficiency Neg Hx     Review of Systems  Constitutional: Negative.   HENT: Negative.   Eyes: Negative.   Respiratory: Negative.   Cardiovascular: Negative.   Gastrointestinal: Negative.   Endocrine: Negative.   Genitourinary: Negative.        Vaginal lesions  Musculoskeletal: Negative.   Skin: Negative.   Allergic/Immunologic: Negative.   Neurological: Negative.   Psychiatric/Behavioral: Negative.     Exam:   BP 122/70 (BP Location: Right Arm, Patient Position: Sitting, Cuff Size: Normal)   Pulse 80   Resp 15   Ht 5\' 6"  (1.676 m)   Wt 252 lb (114.3 kg)   BMI 40.67 kg/m   Weight change: @WEIGHTCHANGE @ Height:   Height: 5\' 6"  (167.6 cm)  Ht Readings from Last 3 Encounters:  12/09/15 5\' 6"  (1.676 m)  12/09/15 5\' 6"  (1.676 m)  12/01/15 5\' 6"  (1.676 m)    General appearance: alert, cooperative and appears stated age   Pelvic: External genitalia:  She has an epidermoid cyst on the left labia majora, not tender. No visible lesion on the right vulva. The area of concern is a small induration palpated under the skin on the lower right labia majora.  Not in the bartholin's gland. Bartholin's are normal bilaterally              Urethra:  normal appearing urethra with no masses, tenderness or lesions              Bartholins and Skenes: normal                  Chaperone was present for exam.  A:  Recurrent right vulvar boil, responded well to antibiotic treatment  Epidermoid cyst on left vulva, discussed that this is not worrisome and doesn't need to be removed  Absent libido. Discussed the multifactorial nature of libido. Discussed that her chronic pain, PMP status, depression and multiple medications could be playing a role  P:   Return with recurrent vulvar boil, she should use warm compresses and hot soaks as needed  Would I&D depending on size of the boil  Given reading and web site suggestions for low libido  Will check her testosterone levels (she is aware any testosterone treatment would be off label)

## 2015-12-09 NOTE — Progress Notes (Addendum)
Cardiologist denies  Medical Md is Dr.Bruce Burchette  Echo denies  Stress test denies  Heart cath denies  EKG in epic from 05-20-15  CXR denies in past yr  Sleep study in epic from 2013 and 2017

## 2015-12-10 LAB — TESTOSTERONE, TOTAL, LC/MS/MS: Testosterone, Total, LC-MS-MS: 14 ng/dL (ref 2–45)

## 2015-12-11 ENCOUNTER — Ambulatory Visit (HOSPITAL_COMMUNITY): Payer: Medicare Other | Admitting: Anesthesiology

## 2015-12-11 ENCOUNTER — Ambulatory Visit (HOSPITAL_COMMUNITY): Payer: Medicare Other

## 2015-12-11 ENCOUNTER — Encounter (HOSPITAL_COMMUNITY)
Admission: RE | Disposition: A | Discharge status: Home / Self Care | Payer: Self-pay | Source: Ambulatory Visit | Attending: Anesthesiology

## 2015-12-11 ENCOUNTER — Ambulatory Visit (HOSPITAL_COMMUNITY)
Admission: RE | Admit: 2015-12-11 | Discharge: 2015-12-11 | Disposition: A | Discharge status: Home / Self Care | Payer: Medicare Other | Source: Ambulatory Visit | Attending: Anesthesiology | Admitting: Anesthesiology

## 2015-12-11 ENCOUNTER — Encounter (HOSPITAL_COMMUNITY): Payer: Self-pay | Admitting: Certified Registered Nurse Anesthetist

## 2015-12-11 DIAGNOSIS — M5416 Radiculopathy, lumbar region: Secondary | ICD-10-CM | POA: Diagnosis not present

## 2015-12-11 DIAGNOSIS — Z6841 Body Mass Index (BMI) 40.0 and over, adult: Secondary | ICD-10-CM | POA: Insufficient documentation

## 2015-12-11 DIAGNOSIS — G4730 Sleep apnea, unspecified: Secondary | ICD-10-CM | POA: Insufficient documentation

## 2015-12-11 DIAGNOSIS — M961 Postlaminectomy syndrome, not elsewhere classified: Secondary | ICD-10-CM | POA: Insufficient documentation

## 2015-12-11 DIAGNOSIS — Z419 Encounter for procedure for purposes other than remedying health state, unspecified: Secondary | ICD-10-CM

## 2015-12-11 DIAGNOSIS — F419 Anxiety disorder, unspecified: Secondary | ICD-10-CM | POA: Insufficient documentation

## 2015-12-11 DIAGNOSIS — K219 Gastro-esophageal reflux disease without esophagitis: Secondary | ICD-10-CM | POA: Insufficient documentation

## 2015-12-11 DIAGNOSIS — Z8249 Family history of ischemic heart disease and other diseases of the circulatory system: Secondary | ICD-10-CM | POA: Insufficient documentation

## 2015-12-11 DIAGNOSIS — N189 Chronic kidney disease, unspecified: Secondary | ICD-10-CM | POA: Insufficient documentation

## 2015-12-11 DIAGNOSIS — J45909 Unspecified asthma, uncomplicated: Secondary | ICD-10-CM | POA: Insufficient documentation

## 2015-12-11 DIAGNOSIS — I129 Hypertensive chronic kidney disease with stage 1 through stage 4 chronic kidney disease, or unspecified chronic kidney disease: Secondary | ICD-10-CM | POA: Insufficient documentation

## 2015-12-11 DIAGNOSIS — M797 Fibromyalgia: Secondary | ICD-10-CM | POA: Insufficient documentation

## 2015-12-11 DIAGNOSIS — F329 Major depressive disorder, single episode, unspecified: Secondary | ICD-10-CM | POA: Insufficient documentation

## 2015-12-11 DIAGNOSIS — G8929 Other chronic pain: Secondary | ICD-10-CM | POA: Diagnosis not present

## 2015-12-11 DIAGNOSIS — M1990 Unspecified osteoarthritis, unspecified site: Secondary | ICD-10-CM | POA: Insufficient documentation

## 2015-12-11 HISTORY — PX: SPINAL CORD STIMULATOR INSERTION: SHX5378

## 2015-12-11 SURGERY — INSERTION, SPINAL CORD STIMULATOR, LUMBAR
Anesthesia: Monitor Anesthesia Care | Site: Spine Lumbar

## 2015-12-11 MED ORDER — MIDAZOLAM HCL 2 MG/2ML IJ SOLN
INTRAMUSCULAR | Status: AC
  Filled 2015-12-11: qty 2

## 2015-12-11 MED ORDER — PROPOFOL 10 MG/ML IV BOLUS
INTRAVENOUS | Status: DC | PRN
  Administered 2015-12-11: 20 mg via INTRAVENOUS

## 2015-12-11 MED ORDER — LIDOCAINE 2% (20 MG/ML) 5 ML SYRINGE
INTRAMUSCULAR | Status: AC
  Filled 2015-12-11: qty 5

## 2015-12-11 MED ORDER — FENTANYL CITRATE (PF) 100 MCG/2ML IJ SOLN
INTRAMUSCULAR | Status: AC
  Filled 2015-12-11: qty 4

## 2015-12-11 MED ORDER — PROPOFOL 1000 MG/100ML IV EMUL
INTRAVENOUS | Status: AC
  Filled 2015-12-11: qty 200

## 2015-12-11 MED ORDER — LACTATED RINGERS IV SOLN
INTRAVENOUS | Status: DC | PRN
  Administered 2015-12-11: 07:00:00 via INTRAVENOUS

## 2015-12-11 MED ORDER — HYDROCODONE-ACETAMINOPHEN 7.5-325 MG PO TABS: 1.0000 | ORAL_TABLET | ORAL | 0 refills | Status: DC | PRN

## 2015-12-11 MED ORDER — MIDAZOLAM HCL 2 MG/2ML IJ SOLN
INTRAMUSCULAR | Status: DC | PRN
Start: 2015-12-11 — End: 2015-12-11
  Administered 2015-12-11: 2 mg via INTRAVENOUS

## 2015-12-11 MED ORDER — LIDOCAINE 2% (20 MG/ML) 5 ML SYRINGE
INTRAMUSCULAR | Status: AC
  Filled 2015-12-11: qty 10

## 2015-12-11 MED ORDER — PROPOFOL 500 MG/50ML IV EMUL
INTRAVENOUS | Status: DC | PRN
  Administered 2015-12-11: 75 ug/kg/min via INTRAVENOUS

## 2015-12-11 MED ORDER — PROPOFOL 10 MG/ML IV BOLUS
INTRAVENOUS | Status: AC
  Filled 2015-12-11: qty 40

## 2015-12-11 MED ORDER — CEFAZOLIN SODIUM-DEXTROSE 2-4 GM/100ML-% IV SOLN
2.0000 g | INTRAVENOUS | Status: AC
  Administered 2015-12-11: 2 g via INTRAVENOUS

## 2015-12-11 MED ORDER — CEFAZOLIN SODIUM-DEXTROSE 2-4 GM/100ML-% IV SOLN
INTRAVENOUS | Status: AC
  Filled 2015-12-11: qty 100

## 2015-12-11 MED ORDER — LIDOCAINE HCL (CARDIAC) 20 MG/ML IV SOLN
INTRAVENOUS | Status: DC | PRN
  Administered 2015-12-11: 100 mg via INTRAVENOUS

## 2015-12-11 MED ORDER — BACITRACIN-NEOMYCIN-POLYMYXIN OINTMENT TUBE
TOPICAL_OINTMENT | CUTANEOUS | Status: DC | PRN
  Administered 2015-12-11: 1 via TOPICAL

## 2015-12-11 MED ORDER — 0.9 % SODIUM CHLORIDE (POUR BTL) OPTIME
TOPICAL | Status: DC | PRN
  Administered 2015-12-11: 1000 mL

## 2015-12-11 MED ORDER — FENTANYL CITRATE (PF) 100 MCG/2ML IJ SOLN
INTRAMUSCULAR | Status: DC | PRN
  Administered 2015-12-11 (×2): 25 ug via INTRAVENOUS
  Administered 2015-12-11: 50 ug via INTRAVENOUS
  Administered 2015-12-11: 25 ug via INTRAVENOUS

## 2015-12-11 MED ORDER — SODIUM CHLORIDE 0.9 % IR SOLN
Status: DC | PRN
  Administered 2015-12-11: 500 mL

## 2015-12-11 MED ORDER — BUPIVACAINE-EPINEPHRINE (PF) 0.5% -1:200000 IJ SOLN
INTRAMUSCULAR | Status: DC | PRN
  Administered 2015-12-11: 30 mL
  Administered 2015-12-11: 7 mL

## 2015-12-11 SURGICAL SUPPLY — 67 items
ANCHOR CLIK X NEURO (Stimulator) ×2 IMPLANT
BAG DECANTER FOR FLEXI CONT (MISCELLANEOUS) ×4 IMPLANT
BENZOIN TINCTURE PRP APPL 2/3 (GAUZE/BANDAGES/DRESSINGS) IMPLANT
BINDER ABDOMINAL 12 ML 46-62 (SOFTGOODS) ×2 IMPLANT
BLADE CLIPPER SURG (BLADE) IMPLANT
CABLE OR STIMULATOR 2X8 61 (WIRE) ×4 IMPLANT
CHLORAPREP W/TINT 26ML (MISCELLANEOUS) ×2 IMPLANT
CLIP TI WIDE RED SMALL 6 (CLIP) IMPLANT
DERMABOND ADVANCED (GAUZE/BANDAGES/DRESSINGS) ×1
DERMABOND ADVANCED .7 DNX12 (GAUZE/BANDAGES/DRESSINGS) ×1 IMPLANT
DRAPE C-ARM 42X72 X-RAY (DRAPES) ×2 IMPLANT
DRAPE C-ARMOR (DRAPES) ×2 IMPLANT
DRAPE LAPAROTOMY 100X72X124 (DRAPES) ×2 IMPLANT
DRAPE POUCH INSTRU U-SHP 10X18 (DRAPES) ×2 IMPLANT
DRAPE SURG 17X23 STRL (DRAPES) ×2 IMPLANT
DRSG OPSITE POSTOP 3X4 (GAUZE/BANDAGES/DRESSINGS) ×2 IMPLANT
DRSG OPSITE POSTOP 4X6 (GAUZE/BANDAGES/DRESSINGS) ×2 IMPLANT
ELECT REM PT RETURN 9FT ADLT (ELECTROSURGICAL) ×2
ELECTRODE REM PT RTRN 9FT ADLT (ELECTROSURGICAL) ×1 IMPLANT
GAUZE SPONGE 4X4 16PLY XRAY LF (GAUZE/BANDAGES/DRESSINGS) IMPLANT
GLOVE BIOGEL PI IND STRL 7.0 (GLOVE) ×1 IMPLANT
GLOVE BIOGEL PI IND STRL 7.5 (GLOVE) ×1 IMPLANT
GLOVE BIOGEL PI INDICATOR 7.0 (GLOVE) ×1
GLOVE BIOGEL PI INDICATOR 7.5 (GLOVE) ×1
GLOVE ECLIPSE 7.5 STRL STRAW (GLOVE) ×2 IMPLANT
GLOVE EXAM NITRILE LRG STRL (GLOVE) IMPLANT
GLOVE EXAM NITRILE XL STR (GLOVE) IMPLANT
GLOVE EXAM NITRILE XS STR PU (GLOVE) IMPLANT
GLOVE SURG SS PI 6.5 STRL IVOR (GLOVE) ×4 IMPLANT
GOWN STRL REUS W/ TWL LRG LVL3 (GOWN DISPOSABLE) ×2 IMPLANT
GOWN STRL REUS W/ TWL XL LVL3 (GOWN DISPOSABLE) IMPLANT
GOWN STRL REUS W/TWL 2XL LVL3 (GOWN DISPOSABLE) IMPLANT
GOWN STRL REUS W/TWL LRG LVL3 (GOWN DISPOSABLE) ×2
GOWN STRL REUS W/TWL XL LVL3 (GOWN DISPOSABLE)
IPG PRECISION SPECTRA (Stimulator) ×2 IMPLANT
KIT BASIN OR (CUSTOM PROCEDURE TRAY) ×2 IMPLANT
KIT CHARGING (KITS) ×1
KIT CHARGING PRECISION NEURO (KITS) ×1 IMPLANT
KIT PAT PROGRAM FREELINK (KITS) ×1 IMPLANT
KIT ROOM TURNOVER OR (KITS) ×2 IMPLANT
LEAD INFINION CX PERC 70CM (Cable) ×4 IMPLANT
NEEDLE 18GX1X1/2 (RX/OR ONLY) (NEEDLE) IMPLANT
NEEDLE 20541800 (NEEDLE) ×4 IMPLANT
NEEDLE ENTRADA 4.5IN (NEEDLE) ×2 IMPLANT
NEEDLE HYPO 25X1 1.5 SAFETY (NEEDLE) ×2 IMPLANT
NS IRRIG 1000ML POUR BTL (IV SOLUTION) ×2 IMPLANT
PACK LAMINECTOMY NEURO (CUSTOM PROCEDURE TRAY) ×2 IMPLANT
PAD ARMBOARD 7.5X6 YLW CONV (MISCELLANEOUS) ×6 IMPLANT
REMOTE CONTROL KIT (KITS) ×2
SPONGE LAP 4X18 X RAY DECT (DISPOSABLE) IMPLANT
SPONGE SURGIFOAM ABS GEL SZ50 (HEMOSTASIS) IMPLANT
STAPLER SKIN PROX WIDE 3.9 (STAPLE) ×2 IMPLANT
STRIP CLOSURE SKIN 1/2X4 (GAUZE/BANDAGES/DRESSINGS) IMPLANT
SUT MNCRL AB 4-0 PS2 18 (SUTURE) IMPLANT
SUT SILK 0 (SUTURE) ×1
SUT SILK 0 MO-6 18XCR BRD 8 (SUTURE) ×1 IMPLANT
SUT SILK 0 TIES 10X30 (SUTURE) IMPLANT
SUT SILK 2 0 TIES 10X30 (SUTURE) IMPLANT
SUT SILK 2 0SH CR/8 30 (SUTURE) ×6 IMPLANT
SUT VIC AB 2-0 CP2 18 (SUTURE) IMPLANT
SYR EPIDURAL 5ML GLASS (SYRINGE) ×2 IMPLANT
SYRINGE 10CC LL (SYRINGE) IMPLANT
TOOL LONG TUNNEL (SPINAL CORD STIMULATOR) ×2 IMPLANT
TOWEL OR 17X24 6PK STRL BLUE (TOWEL DISPOSABLE) ×2 IMPLANT
TOWEL OR 17X26 10 PK STRL BLUE (TOWEL DISPOSABLE) ×2 IMPLANT
WATER STERILE IRR 1000ML POUR (IV SOLUTION) ×2 IMPLANT
YANKAUER SUCT BULB TIP NO VENT (SUCTIONS) ×2 IMPLANT

## 2015-12-11 NOTE — Anesthesia Preprocedure Evaluation (Addendum)
Anesthesia Evaluation  Patient identified by MRN, date of birth, ID band Patient awake    Reviewed: Allergy & Precautions, NPO status , reviewed documented beta blocker date and time   Airway Mallampati: II  TM Distance: >3 FB Neck ROM: Limited  Mouth opening: Limited Mouth Opening  Dental  (+) Teeth Intact   Pulmonary asthma , sleep apnea ,    breath sounds clear to auscultation       Cardiovascular hypertension,  Rhythm:Regular Rate:Normal     Neuro/Psych    GI/Hepatic Neg liver ROS, GERD  ,  Endo/Other  Morbid obesity  Renal/GU negative Renal ROS     Musculoskeletal  (+) Arthritis ,   Abdominal (+) + obese,   Peds  Hematology   Anesthesia Other Findings   Reproductive/Obstetrics                            Anesthesia Physical Anesthesia Plan  ASA: III  Anesthesia Plan: MAC and General   Post-op Pain Management:    Induction: Intravenous  Airway Management Planned: Oral ETT, Natural Airway and Simple Face Mask  Additional Equipment:   Intra-op Plan:   Post-operative Plan: Extubation in OR  Informed Consent: I have reviewed the patients History and Physical, chart, labs and discussed the procedure including the risks, benefits and alternatives for the proposed anesthesia with the patient or authorized representative who has indicated his/her understanding and acceptance.   Dental advisory given  Plan Discussed with:   Anesthesia Plan Comments:        Anesthesia Quick Evaluation

## 2015-12-11 NOTE — Discharge Instructions (Signed)
Dr. Joceline Hinchcliff Post-Op Orders ° °• Ice Pack - 20 minutes on (in a pillow case), and 20 minutes off. Wear the ice pack UNDER the binder. °• Follow up in office, they will call you for an appointment in 10 days to 2 weeks. °• Increase activity gradually.   °• No lifting anything heavier than a gallon of milk (10 pounds) until seen in the office. °• Advance diet slowly as tolerated. °• Dressing care:  Keep dressing dry for 3 days, and on Post-op day 4, may shower. °• Call for fever, drainage, and redness. °• No swimming or bathing in a bathtub (do not get into standing water). °•  °

## 2015-12-11 NOTE — Op Note (Signed)
PREOP DX: 1) lumbago  2) lumbar radiculopathy  3) lumbar post-laminectomy syndrome  4) chronic pain  POSTOP DX: 1) lumbago  2) lumbar radiculopathy  3) lumbar post-laminectomy syndrome  4) chronic pain PROCEDURES PERFORMED:1) intraop fluoro 2) placement of 2 16 contact [employer] Infinion leads 3) placement of Spectra SCS generator  SURGEON:Zaul Hubers  ASSISTANT: NONE  ANESTHESIA: MAC  EBL: <20cc  DESCRIPTION OF PROCEDURE: After a discussion of risks, benefits and alternatives, informed consent was obtained. The patient was taken to the OR, turned prone onto a Jackson table, all pressure points padded, SCD's placed, and an adequate plane of anesthesia induced. A timeout was taken to verify the correct patient, position, personnel, availability of appropriate equipment, and administration of perioperative antibiotics.  The thoracic and lumbar areas were widely prepped with chloraprep and draped into a sterile field. Fluoroscopy was used to plan a right paramedian incision at the T12-L3 levels, and an incision made with a 10 blade and carried down to the dorsolumbar fascia with the bovie and blunt dissection. Retractors were placed and a 14g [employer] tuohy needle placed into the epidural space at the T11-12 interspace using biplanar fluoro and loss-of-resistance technique. The needle was aspirated without any return of fluid. A [employer] INFINION lead was introduced and under live AP fluoro advanced until the distal-most contact overlay the midportion of the T6 vertebral body shadow with the rest of the contacts distributed over the T7-T9 vertebral bodies in a position just right of anatomic midline. A second Infinion lead was placed just left of anatomic midline in the same levels using the same technique. The patient was awakened and the leads  tested; impedances were good, and the patient reported good coverage with amplitudes in the 5-7 mA range. 0 silk sutures were placed in the fascia adjacent to the needles. The needles and stylets were removed under fluoroscopy with no lead migration noted. Leads were then fixed to the fascia by chest tube-type fixation into position with the sutures; repeat images were obtained to verify that there had been no lead migration.   Attention was then turned to creation of a subcutaneous pocket. At the right flank, a 3 cm incision was made with a 10 blade and using the bovie and blunt dissection a pocket of size appropriate to place a SCS generator. The pocket was trialed, and found to be of adequate size. The pocket was inspected for hemostasis, which was found to be excellent. Using reverse seldinger technique, the leads were tunneled to the pocket site, and the leads inserted into the SCS generator. Impedances were checked, and all found to be in the expected range. The leads were then all fixed into position with a self-torquing wrench. The wiring was all carefully coiled, placed behind the generator and placed in the pocket.  Both incisions were copiously irrigated with bacitracin-containing irrigation. The lumbar incision was closed in 2 deep layers of interrupted 2-0 silk (patient reports allergy to vicryl) and the skin closed with staples. The pocket incision was closed with a deeper layer of 2-0 silk interrupted sutures, and the skin closed with staples. Sterile dressings were applied. Needle, sponge, and instrument counts were correct x2 at the end of the case.  The patient was then carefully awakened from anesthesia, turned supine, an abdominal binder placed, and the patient taken to the recovery room where she underwent complex spinal cord stimulator programming under my direction, with the assistance of the company representative.  COMPLICATIONS: NONE  CONDITION: Stable  throughout the course of  the procedure and immediately afterward  DISPOSITION: discharge to home, with antibiotics and pain medicine. Discussed care with the patient and spouse. Followup in clinic will be scheduled in 10-14 days

## 2015-12-11 NOTE — Anesthesia Postprocedure Evaluation (Signed)
Anesthesia Post Note  Patient: Taylor Ritter  Procedure(s) Performed: Procedure(s) (LRB): LUMBAR SPINAL CORD STIMULATOR INSERTION (N/A)  Patient location during evaluation: PACU Anesthesia Type: MAC Level of consciousness: awake and alert Pain management: pain level controlled Vital Signs Assessment: post-procedure vital signs reviewed and stable Respiratory status: spontaneous breathing, nonlabored ventilation, respiratory function stable and patient connected to nasal cannula oxygen Cardiovascular status: stable and blood pressure returned to baseline Anesthetic complications: no    Last Vitals:  Vitals:   12/11/15 0945 12/11/15 1000  BP: 120/78 (!) 117/59  Pulse: 78 74  Resp: 18 13  Temp: 36.1 C     Last Pain:  Vitals:   12/11/15 0945  TempSrc:   PainSc: 4                  Lj Miyamoto,JAMES TERRILL

## 2015-12-11 NOTE — Transfer of Care (Signed)
Immediate Anesthesia Transfer of Care Note  Patient: Taylor Ritter  Procedure(s) Performed: Procedure(s): LUMBAR SPINAL CORD STIMULATOR INSERTION (N/A)  Patient Location: PACU  Anesthesia Type:MAC  Level of Consciousness: awake, alert  and oriented  Airway & Oxygen Therapy: Patient Spontanous Breathing and Patient connected to face mask oxygen  Post-op Assessment: Report given to RN, Post -op Vital signs reviewed and stable and Patient moving all extremities X 4  Post vital signs: Reviewed and stable  Last Vitals:  Vitals:   12/11/15 0637 12/11/15 0945  BP: (!) 120/57 120/78  Pulse: 86 78  Resp: 16 18  Temp: 36.8 C 36.1 C    Last Pain:  Vitals:   12/11/15 0658  TempSrc:   PainSc: 5       Patients Stated Pain Goal: 3 ([id number])  Complications: No apparent anesthesia complications

## 2015-12-12 LAB — TESTOSTERONE, FREE, LC/MS/MS: Testosterone, Free, LCM/MS/MS: 1.7 pg/mL (ref 0.2–5.0)

## 2015-12-14 ENCOUNTER — Encounter (HOSPITAL_COMMUNITY): Payer: Self-pay | Admitting: Anesthesiology

## 2015-12-14 ENCOUNTER — Other Ambulatory Visit (HOSPITAL_COMMUNITY): Payer: Medicare Other

## 2015-12-23 ENCOUNTER — Other Ambulatory Visit: Payer: Self-pay | Admitting: Family Medicine

## 2015-12-30 ENCOUNTER — Ambulatory Visit (INDEPENDENT_AMBULATORY_CARE_PROVIDER_SITE_OTHER): Payer: Medicare Other | Admitting: Family Medicine

## 2015-12-30 DIAGNOSIS — Z23 Encounter for immunization: Secondary | ICD-10-CM

## 2016-01-05 ENCOUNTER — Ambulatory Visit: Payer: Medicare Other | Admitting: Obstetrics and Gynecology

## 2016-01-11 ENCOUNTER — Ambulatory Visit (INDEPENDENT_AMBULATORY_CARE_PROVIDER_SITE_OTHER): Payer: Medicare Other | Admitting: Obstetrics and Gynecology

## 2016-01-11 ENCOUNTER — Encounter: Payer: Self-pay | Admitting: Obstetrics and Gynecology

## 2016-01-11 VITALS — BP 130/70 | HR 84 | Resp 16 | Ht 66.0 in | Wt 256.0 lb

## 2016-01-11 DIAGNOSIS — N952 Postmenopausal atrophic vaginitis: Secondary | ICD-10-CM | POA: Diagnosis not present

## 2016-01-11 DIAGNOSIS — N3281 Overactive bladder: Secondary | ICD-10-CM

## 2016-01-11 DIAGNOSIS — B372 Candidiasis of skin and nail: Secondary | ICD-10-CM

## 2016-01-11 DIAGNOSIS — Z01419 Encounter for gynecological examination (general) (routine) without abnormal findings: Secondary | ICD-10-CM

## 2016-01-11 DIAGNOSIS — R6882 Decreased libido: Secondary | ICD-10-CM | POA: Diagnosis not present

## 2016-01-11 DIAGNOSIS — N941 Unspecified dyspareunia: Secondary | ICD-10-CM | POA: Diagnosis not present

## 2016-01-11 DIAGNOSIS — R319 Hematuria, unspecified: Secondary | ICD-10-CM | POA: Diagnosis not present

## 2016-01-11 DIAGNOSIS — Z0000 Encounter for general adult medical examination without abnormal findings: Secondary | ICD-10-CM | POA: Diagnosis not present

## 2016-01-11 LAB — POCT URINALYSIS DIPSTICK
Bilirubin, UA: NEGATIVE
Glucose, UA: NEGATIVE
Ketones, UA: NEGATIVE
NITRITE UA: NEGATIVE
PH UA: 5
PROTEIN UA: NEGATIVE
UROBILINOGEN UA: NEGATIVE

## 2016-01-11 MED ORDER — NYSTATIN 100000 UNIT/GM EX CREA
1.0000 | TOPICAL_CREAM | Freq: Two times a day (BID) | CUTANEOUS | 2 refills | Status: DC
Start: 2016-01-11 — End: 2017-10-06

## 2016-01-11 MED ORDER — ESTRADIOL 10 MCG VA TABS: 1.0000 | ORAL_TABLET | VAGINAL | 3 refills | Status: DC

## 2016-01-11 NOTE — Patient Instructions (Signed)

## 2016-01-11 NOTE — Progress Notes (Signed)
55 y.o. G1P1001 MarriedCaucasianF here for annual exam.  H/O hysterectomy. She has multiple medical issues, including chronic pain and depression.  Not sexually active secondary to absent libido. H/O entry dyspareunia, lubricant helped. She has had recent normal testosterone levels.  Long term issues with urinary urgency and frequency (no change). She has some urge incontinence, tried several medication in the past (including myrbetriq, no help).  No dysuria, no obvious hematuria. She tried the PTMS (stimulation in the leg) it helped, but doesn't want to keep doing it.     No LMP recorded. Patient has had a hysterectomy.          Sexually active: No.  The current method of family planning is status post hysterectomy.    Exercising: No.  The patient does not participate in regular exercise at present. Smoker:  no  Health Maintenance: Pap:  08-03-11 WNL History of abnormal Pap:  no MMG:  11-07-14 WNL  Colonoscopy:  08-23-12 repeat in 5 years BMD:   never TDaP:  08-09-12 Gardasil: N/A   reports that she has never smoked. She has never used smokeless tobacco. She reports that she does not drink alcohol or use drugs.She is on disability for pain. She has daughter, 24. She has 2 grand sons and 3 step grandchildren. She is raising one step son.   Past Medical History:  Diagnosis Date  . Allergy    takes Allegra daily and uses nasal spray daily.Takes Benadryl as needed.Uses eye drops daily  . Anemia    B 12 shots monthly  . Anxiety    takes Xanax as needed  . Arthritis    osteoporosis  . Asthma    Proventil inhaler as needed  . Chronic back pain    DDD and stenosis  . Complication of anesthesia    pt states she was woke up during surgery  . Depression    takes Wellbutrin daily  . Fibromyalgia   . GERD (gastroesophageal reflux disease)    takes Nexium daily  . Headache(784.0)    takes Topamax daily as needed  . History of bronchitis    2016  . History of colon polyps    benign  .  History of vertigo    took Meclizine several yrs ago  . Hypertension    followed by [facility]  . IBS (irritable bowel syndrome)    takes LInzess daily  . Insomnia   . Joint pain   . Muscle spasm    takes Flexeril daily as needed  . Neuromuscular disorder (HCC)    numbness arms legs,feet   . Nocturia   . Osteoporosis   . Pain    takes Keppra and Topamax daily  . Peripheral edema    takes HCTZ daily  . Sleep apnea    has a CPAP but doesn't wear often  . Urinary frequency   . Urinary incontinence   . Urinary urgency     Past Surgical History:  Procedure Laterality Date  . ABDOMINAL HYSTERECTOMY    . ANTERIOR FUSION CERVICAL SPINE    . BACK SURGERY  2012  . bladder tacked    . CARPAL TUNNEL RELEASE     R&L  . CESAREAN SECTION  1983  . CHOLECYSTECTOMY    . COLONOSCOPY    . ESOPHAGEAL DILATION    . KNEE ARTHROSCOPY     L KNEE  . LUMBAR LAMINECTOMY    . LUMBAR LAMINECTOMY/DECOMPRESSION MICRODISCECTOMY  02/25/2011   Procedure: LUMBAR LAMINECTOMY/DECOMPRESSION MICRODISCECTOMY;  Surgeon: Floyce Stakes;  Location: [place] [facility];  Service: Neurosurgery;  Laterality: N/A;  Lumbar Four-Five Microdiscectomy  . LUMBAR WOUND DEBRIDEMENT  03/18/2011   Procedure: LUMBAR WOUND DEBRIDEMENT;  Surgeon: Floyce Stakes;  Location: [place] [facility];  Service: Neurosurgery;  Laterality: N/A;  Exploration of Lumbar wound  . SEPTOPLASTY  10+yrs ago  . SPINAL CORD STIMULATOR INSERTION N/A 12/11/2015   Procedure: LUMBAR SPINAL CORD STIMULATOR INSERTION;  Surgeon: Clydell Hakim, MD;  Location: [place] [facility];  Service: Neurosurgery;  Laterality: N/A;    Current Outpatient Prescriptions  Medication Sig Dispense Refill  . albuterol (PROVENTIL HFA;VENTOLIN HFA) 108 (90 BASE) MCG/ACT inhaler Inhale 1 puff into the lungs every 4 (four) hours as needed for wheezing or shortness of breath.    . ALPRAZolam (XANAX) 1 MG tablet Take 1 tablet (1 mg total) by mouth 3 (three) times daily  as needed. 90 tablet 3  . Azelastine HCl (ASTEPRO) 0.15 % SOLN Place 1 spray into the nose 2 (two) times daily.      . Bepotastine Besilate (BEPREVE OP) Place 1 drop into both eyes 2 (two) times daily as needed.     Marland Kitchen buPROPion (WELLBUTRIN XL) 150 MG 24 hr tablet Take 3 tablets by mouth  once a day 270 tablet 2  . cyclobenzaprine (FLEXERIL) 10 MG tablet Take 1 tablet (10 mg total) by mouth 3 (three) times daily as needed. For muscle spasms 90 tablet 1  . diphenhydrAMINE (BENADRYL) 50 MG capsule Take 50 mg by mouth every 6 (six) hours as needed for allergies.     Marland Kitchen esomeprazole (NEXIUM) 40 MG capsule Take 1 capsule (40 mg total) by mouth 2 (two) times daily. Brand name only 180 capsule 1  . fexofenadine (ALLEGRA) 180 MG tablet Take 180 mg by mouth daily.      . fluticasone (VERAMYST) 27.5 MCG/SPRAY nasal spray Place 2 sprays into the nose daily.      . hydrochlorothiazide (HYDRODIURIL) 25 MG tablet TAKE 1 TABLET BY MOUTH  DAILY 90 tablet 2  . HYDROcodone-acetaminophen (NORCO) 7.5-325 MG tablet Take 1 tablet by mouth every 4 (four) hours as needed for moderate pain. 40 tablet 0  . levETIRAcetam (KEPPRA) 250 MG tablet Take 500 mg by mouth 2 (two) times daily.    Marland Kitchen linaclotide (LINZESS) 145 MCG CAPS capsule Take 145 mcg by mouth 2 (two) times daily as needed.     . Mometasone Furo-Formoterol Fum (DULERA) 200-5 MCG/ACT AERO Inhale 1 puff into the lungs 2 (two) times daily.      . montelukast (SINGULAIR) 10 MG tablet Take 10 mg by mouth at bedtime.    . pilocarpine (SALAGEN) 5 MG tablet Take 5 mg by mouth as needed (dryness).     Marland Kitchen PRESCRIPTION MEDICATION Inject 1 vial into the muscle every 14 (fourteen) days. Allergy shot     . topiramate (TOPAMAX) 50 MG tablet Take 50-100 mg by mouth See admin instructions. 1 tab in AM, 2 tabs in PM  Per Dr Maryjean Ka, pain management    . valACYclovir (VALTREX) 500 MG tablet Take 1 tablet by mouth two  times daily 180 tablet 3  . VIIBRYD 40 MG TABS Take 1 tablet by  mouth  daily 90 tablet 2  . XIFAXAN 550 MG TABS tablet      No current facility-administered medications for this visit.     Family History  Problem Relation Age of Onset  . Arthritis Mother   . Hypertension Mother   .  Kidney disease Mother   . COPD Mother   . Heart disease Mother 61    CAD  . Diabetes Mother   . Mental illness Mother   . Arthritis Father   . Diabetes Father   . Heart disease Father   . Cancer Father     prostate cancer  . Mental illness Father     Alzheimers Dementia  . Cancer Maternal Grandmother     brain tumor  . Cancer Maternal Grandfather     lung  . Diabetes Maternal Grandfather   . Early death Brother   . Anesthesia problems Neg Hx   . Hypotension Neg Hx   . Malignant hyperthermia Neg Hx   . Pseudochol deficiency Neg Hx     Review of Systems  Constitutional: Negative.   HENT: Positive for hearing loss and sinus pressure.   Eyes: Negative.   Respiratory: Negative.   Cardiovascular: Negative.   Gastrointestinal: Positive for constipation.  Endocrine: Negative.   Genitourinary: Positive for frequency and urgency.       Night urination    Musculoskeletal: Positive for myalgias.  Skin: Negative.   Allergic/Immunologic: Negative.   Neurological: Negative.   Psychiatric/Behavioral: Negative.     Exam:   BP 130/70 (BP Location: Right Arm, Patient Position: Sitting, Cuff Size: Normal)   Pulse 84   Resp 16   Ht 5\' 6"  (1.676 m)   Wt 256 lb (116.1 kg)   BMI 41.32 kg/m   Weight change: @WEIGHTCHANGE @ Height:   Height: 5\' 6"  (167.6 cm)  Ht Readings from Last 3 Encounters:  01/11/16 5\' 6"  (1.676 m)  12/09/15 5\' 6"  (1.676 m)  12/09/15 5\' 6"  (1.676 m)    General appearance: alert, cooperative and appears stated age Head: Normocephalic, without obvious abnormality, atraumatic Neck: no adenopathy, supple, symmetrical, trachea midline and thyroid normal to inspection and palpation Lungs: clear to auscultation bilaterally Breasts: normal  appearance, no masses or tenderness Heart: regular rate and rhythm Abdomen: soft, non-tender; bowel sounds normal; no masses,  no organomegaly Extremities: extremities normal, atraumatic, no cyanosis or edema Skin: Skin color, texture, turgor normal. Bilateral groin with a patchy, erythematous rash c/w candida intertrigo. Lymph nodes: Cervical, supraclavicular, and axillary nodes normal. No abnormal inguinal nodes palpated Neurologic: Grossly normal   Pelvic: External genitalia:  no lesions              Urethra:  normal appearing urethra with no masses, tenderness or lesions              Bartholins and Skenes: normal                 Vagina: atrophic vagina, no discharge, no lesions              Cervix: absent               Bimanual Exam:  Uterus:  uterus absent              Adnexa: no mass, fullness, tenderness               Rectovaginal: Confirms               Anus:  normal sphincter tone, no lesions  Chaperone was present for exam.  A:  Well Woman with normal exam  OAB, urge incontinence  Hematuria on urine dip  Low libido (normal testosterone levels)  Dyspareunia  Candida Intertrigo   P:   No pap needed  Urine for ua, c&s  Consider sending her for an interstim consultation (declines for now)  Mammogram and colonoscopy are UTD  Discussed breast self exam  Discussed calcium and vit D intake  Labs and immunizations with primary  Nystatin cream  Start vaginal estrogen tablets  Information on libido given

## 2016-01-12 LAB — URINALYSIS, MICROSCOPIC ONLY
CASTS: NONE SEEN [LPF]
CRYSTALS: NONE SEEN [HPF]
YEAST: NONE SEEN [HPF]

## 2016-01-13 LAB — URINE CULTURE: Organism ID, Bacteria: NO GROWTH

## 2016-02-01 ENCOUNTER — Ambulatory Visit (INDEPENDENT_AMBULATORY_CARE_PROVIDER_SITE_OTHER): Payer: Medicare Other | Admitting: Family Medicine

## 2016-02-01 ENCOUNTER — Other Ambulatory Visit: Payer: Self-pay | Admitting: Family Medicine

## 2016-02-01 DIAGNOSIS — E538 Deficiency of other specified B group vitamins: Secondary | ICD-10-CM | POA: Diagnosis not present

## 2016-02-01 MED ORDER — CYANOCOBALAMIN 1000 MCG/ML IJ SOLN
1000.0000 ug | Freq: Once | INTRAMUSCULAR | Status: AC
  Administered 2016-02-01: 1000 ug via INTRAMUSCULAR

## 2016-02-01 NOTE — Progress Notes (Signed)
Pt here for B12 injection only- as nurse visit.

## 2016-02-01 NOTE — Telephone Encounter (Signed)
Due for refill 02-12-16.  She should not be exceeding TID.

## 2016-02-02 NOTE — Telephone Encounter (Signed)
Spoke to patient verbalize understanding that the prescription was not due for refill until 02/12/16 per Dr Elease Hashimoto and should not exceed TID as needed.

## 2016-02-07 ENCOUNTER — Other Ambulatory Visit: Payer: Self-pay | Admitting: Family Medicine

## 2016-02-15 ENCOUNTER — Telehealth: Payer: Self-pay | Admitting: Family Medicine

## 2016-02-15 NOTE — Telephone Encounter (Signed)
Last OV 02-01-2016 Last refill 10-13-2015 #90, 3rf Please advise

## 2016-02-15 NOTE — Telephone Encounter (Signed)
Pt needs a refill on alprazolam send to [facility]

## 2016-02-15 NOTE — Telephone Encounter (Signed)
Refill with 3 additional refills   

## 2016-02-16 MED ORDER — ALPRAZOLAM 1 MG PO TABS: 1.0000 mg | ORAL_TABLET | Freq: Three times a day (TID) | ORAL | 3 refills | Status: DC | PRN

## 2016-02-16 NOTE — Telephone Encounter (Signed)
Medication sent in for patient. 

## 2016-02-25 ENCOUNTER — Other Ambulatory Visit: Payer: Self-pay | Admitting: Family Medicine

## 2016-03-01 ENCOUNTER — Ambulatory Visit (INDEPENDENT_AMBULATORY_CARE_PROVIDER_SITE_OTHER): Payer: Medicare Other

## 2016-03-01 DIAGNOSIS — E538 Deficiency of other specified B group vitamins: Secondary | ICD-10-CM

## 2016-03-01 MED ORDER — CYANOCOBALAMIN 1000 MCG/ML IJ SOLN
1000.0000 ug | Freq: Once | INTRAMUSCULAR | Status: AC
  Administered 2016-03-01: 1000 ug via INTRAMUSCULAR

## 2016-04-01 ENCOUNTER — Ambulatory Visit (INDEPENDENT_AMBULATORY_CARE_PROVIDER_SITE_OTHER): Payer: Medicare Other

## 2016-04-01 DIAGNOSIS — E538 Deficiency of other specified B group vitamins: Secondary | ICD-10-CM | POA: Diagnosis not present

## 2016-04-01 MED ORDER — CYANOCOBALAMIN 1000 MCG/ML IJ SOLN
1000.0000 ug | Freq: Once | INTRAMUSCULAR | Status: AC
  Administered 2016-04-01: 1000 ug via INTRAMUSCULAR

## 2016-04-13 ENCOUNTER — Other Ambulatory Visit: Payer: Self-pay

## 2016-04-13 MED ORDER — HYDROCHLOROTHIAZIDE 25 MG PO TABS: 25.0000 mg | ORAL_TABLET | Freq: Every day | ORAL | 2 refills | Status: DC

## 2016-04-13 MED ORDER — BUPROPION HCL ER (XL) 150 MG PO TB24: ORAL_TABLET | ORAL | 2 refills | Status: DC

## 2016-05-02 ENCOUNTER — Ambulatory Visit (INDEPENDENT_AMBULATORY_CARE_PROVIDER_SITE_OTHER): Payer: Medicare Other

## 2016-05-02 DIAGNOSIS — E538 Deficiency of other specified B group vitamins: Secondary | ICD-10-CM

## 2016-05-02 MED ORDER — CYANOCOBALAMIN 1000 MCG/ML IJ SOLN
1000.0000 ug | Freq: Once | INTRAMUSCULAR | Status: AC
Start: 2016-05-02 — End: 2016-05-02
  Administered 2016-05-02: 1000 ug via INTRAMUSCULAR

## 2016-05-30 ENCOUNTER — Ambulatory Visit (INDEPENDENT_AMBULATORY_CARE_PROVIDER_SITE_OTHER): Payer: Medicare Other | Admitting: Family Medicine

## 2016-05-30 VITALS — BP 120/80 | HR 84 | Temp 98.7°F | Wt 258.0 lb

## 2016-05-30 DIAGNOSIS — E538 Deficiency of other specified B group vitamins: Secondary | ICD-10-CM

## 2016-05-30 DIAGNOSIS — R739 Hyperglycemia, unspecified: Secondary | ICD-10-CM

## 2016-05-30 DIAGNOSIS — I10 Essential (primary) hypertension: Secondary | ICD-10-CM | POA: Diagnosis not present

## 2016-05-30 LAB — BASIC METABOLIC PANEL
BUN: 16 mg/dL (ref 6–23)
CO2: 27 mEq/L (ref 19–32)
CREATININE: 1.07 mg/dL (ref 0.40–1.20)
Calcium: 9.5 mg/dL (ref 8.4–10.5)
Chloride: 107 mEq/L (ref 96–112)
GFR: 56.25 mL/min — AB (ref >60.00)
Glucose, Bld: 99 mg/dL (ref 70–99)
POTASSIUM: 3.5 meq/L (ref 3.5–5.1)
Sodium: 141 mEq/L (ref 135–145)

## 2016-05-30 LAB — HEMOGLOBIN A1C: Hgb A1c MFr Bld: 5.7 % (ref 4.6–6.5)

## 2016-05-30 MED ORDER — VILAZODONE HCL 40 MG PO TABS: 40.0000 mg | ORAL_TABLET | Freq: Every day | ORAL | 3 refills | Status: DC

## 2016-05-30 MED ORDER — CYANOCOBALAMIN 1000 MCG/ML IJ SOLN
1000.0000 ug | Freq: Once | INTRAMUSCULAR | Status: AC
  Administered 2016-05-30: 1000 ug via INTRAMUSCULAR

## 2016-05-30 MED ORDER — VILAZODONE HCL 40 MG PO TABS: 40.0000 mg | ORAL_TABLET | Freq: Every day | ORAL | 6 refills | Status: DC

## 2016-05-30 NOTE — Progress Notes (Signed)
Pre visit review using our clinic review tool, if applicable. No additional management support is needed unless otherwise documented below in the visit note. 

## 2016-05-30 NOTE — Progress Notes (Signed)
Subjective:     Patient ID: Taylor Ritter, female   DOB: Jan 23, 1961, 56 y.o.   MRN: [id number]  HPI Patient seen for medical follow-up. She has chronic problems including obesity, hypertension, GERD, B12 deficiency, recurrent major depression, chronic anxiety.  She has B12 deficiency and will tried transitioning over year ago from intramuscular replacement to oral but her levels dropped back down. She is requesting injection today. Last levels were over a year ago.  Type 2 diabetes. Borderline in the past and not requiring medical therapy. Last A1c was stable. No polyuria or polydipsia.  Asthma. She's had some recent wheezing and she attributes to environmental exposure with dust. She states she had her husband are looking at possibly moving soon. She remains on Symbicort and as needed albuterol  History of recurrent depression.  She is requesting refills of Viibrid.  Also takes Wellbutrin and feels this regimen is working well for her.  She has chronic back pain followed by pain management. She's currently stable on regimen of Keppra  Past Medical History:  Diagnosis Date  . Allergy    takes Allegra daily and uses nasal spray daily.Takes Benadryl as needed.Uses eye drops daily  . Anemia    B 12 shots monthly  . Anxiety    takes Xanax as needed  . Arthritis    osteoporosis  . Asthma    Proventil inhaler as needed  . Chronic back pain    DDD and stenosis  . Complication of anesthesia    pt states she was woke up during surgery  . Depression    takes Wellbutrin daily  . Fibromyalgia   . GERD (gastroesophageal reflux disease)    takes Nexium daily  . Headache(784.0)    takes Topamax daily as needed  . History of bronchitis    2016  . History of colon polyps    benign  . History of vertigo    took Meclizine several yrs ago  . Hypertension    followed by [facility]  . IBS (irritable bowel syndrome)    takes LInzess daily  . Insomnia   . Joint pain    . Muscle spasm    takes Flexeril daily as needed  . Neuromuscular disorder (HCC)    numbness arms legs,feet   . Nocturia   . Osteoporosis   . Pain    takes Keppra and Topamax daily  . Peripheral edema    takes HCTZ daily  . Sleep apnea    has a CPAP but doesn't wear often  . Urinary frequency   . Urinary incontinence   . Urinary urgency    Past Surgical History:  Procedure Laterality Date  . ABDOMINAL HYSTERECTOMY    . ANTERIOR FUSION CERVICAL SPINE    . BACK SURGERY  2012  . bladder tacked    . CARPAL TUNNEL RELEASE     R&L  . CESAREAN SECTION  1983  . CHOLECYSTECTOMY    . COLONOSCOPY    . ESOPHAGEAL DILATION    . KNEE ARTHROSCOPY     L KNEE  . LUMBAR LAMINECTOMY    . LUMBAR LAMINECTOMY/DECOMPRESSION MICRODISCECTOMY  02/25/2011   Procedure: LUMBAR LAMINECTOMY/DECOMPRESSION MICRODISCECTOMY;  Surgeon: Floyce Stakes;  Location: [place] [facility];  Service: Neurosurgery;  Laterality: N/A;  Lumbar Four-Five Microdiscectomy  . LUMBAR WOUND DEBRIDEMENT  03/18/2011   Procedure: LUMBAR WOUND DEBRIDEMENT;  Surgeon: Floyce Stakes;  Location: [place] [facility];  Service: Neurosurgery;  Laterality: N/A;  Exploration of Lumbar  wound  . SEPTOPLASTY  10+yrs ago  . SPINAL CORD STIMULATOR INSERTION N/A 12/11/2015   Procedure: LUMBAR SPINAL CORD STIMULATOR INSERTION;  Surgeon: Clydell Hakim, MD;  Location: [place] [facility];  Service: Neurosurgery;  Laterality: N/A;    reports that she has never smoked. She has never used smokeless tobacco. She reports that she does not drink alcohol or use drugs. family history includes Arthritis in her father and mother; COPD in her mother; Cancer in her father, maternal grandfather, and maternal grandmother; Diabetes in her father, maternal grandfather, and mother; Early death in her brother; Heart disease in her father; Heart disease (age of onset: 73) in her mother; Hypertension in her mother; Kidney disease in her mother; Mental illness in her father and  mother. Allergies  Allergen Reactions  . Other Other (See Comments)    Nylon sutures had to remove to allow healing  Vicryl sutures - do not use  . Percocet [Oxycodone-Acetaminophen] Other (See Comments)    "makes her crazy"  . Codeine Hives  . Dye Fdc Red [Red Dye] Hives    Scan dye, broke out in hives all over once with a neck scan  . Morphine And Related Hives and Itching  . Vesicare [Solifenacin Succinate] Itching     Review of Systems  Constitutional: Negative for fatigue.  Eyes: Negative for visual disturbance.  Respiratory: Negative for cough, chest tightness, shortness of breath and wheezing.   Cardiovascular: Negative for chest pain, palpitations and leg swelling.  Musculoskeletal: Positive for back pain.  Neurological: Negative for dizziness, seizures, syncope, weakness, light-headedness and headaches.       Objective:   Physical Exam  Constitutional: She appears well-developed and well-nourished.  Eyes: Pupils are equal, round, and reactive to light.  Neck: Neck supple. No JVD present. No thyromegaly present.  Cardiovascular: Normal rate and regular rhythm.  Exam reveals no gallop.   Pulmonary/Chest: Effort normal and breath sounds normal. No respiratory distress. She has no wheezes. She has no rales.  Musculoskeletal: She exhibits no edema.  Neurological: She is alert.       Assessment:     #1 history of hyperglycemia  #2 history of B12 deficiency  #3 obesity  #4 history of chronic back pain  #5 hypertension stable and at goal    Plan:     -check labs with basic metabolic panel, hemoglobin A1c, B12 level -B12 injection given today -Refilled Viibrid for one year. -Routine follow-up in 6 months and sooner as needed -With a long discussion today regarding our preference that she consider tapering back Xanax 0.5 mg every couple weeks until off if possible.  Eulas Post MD [facility] at [facility]

## 2016-05-31 ENCOUNTER — Encounter: Payer: Self-pay | Admitting: Family Medicine

## 2016-05-31 LAB — VITAMIN B12: VITAMIN B 12: 407 pg/mL (ref 211–911)

## 2016-06-17 ENCOUNTER — Other Ambulatory Visit: Payer: Self-pay | Admitting: Family Medicine

## 2016-06-20 NOTE — Telephone Encounter (Signed)
We had discussed last visit her tapering back- and eventually goal of off this.  Hopefully, she has been able to start this process.  Would refill for #60 and I want her to continue to taper back about 1/2 mg from her total daily dose per week until off.

## 2016-06-20 NOTE — Telephone Encounter (Signed)
Last refill 05/19/16.  Last office visit 05/30/16.  Okay to fill?

## 2016-08-15 ENCOUNTER — Other Ambulatory Visit: Payer: Self-pay | Admitting: Family Medicine

## 2016-08-17 NOTE — Telephone Encounter (Signed)
Last rx given on 4/3 for #60 with no ref

## 2016-08-17 NOTE — Telephone Encounter (Signed)
Pt following up refill request for ALPRAZolam (XANAX) 1 MG tablet  [id number] - [place], [place] - [address]   Pt also would like  NEXIUM 40 MG capsule  [place], [address]

## 2016-08-17 NOTE — Telephone Encounter (Signed)
Rx done and I called the pt and informed her of the message below. 

## 2016-08-17 NOTE — Telephone Encounter (Signed)
She should be tapering back/off. Refill #30.

## 2016-09-15 ENCOUNTER — Other Ambulatory Visit: Payer: Self-pay | Admitting: Family Medicine

## 2016-09-15 NOTE — Telephone Encounter (Signed)
Last refill given on 5/30 for #30 with no ref

## 2016-09-16 ENCOUNTER — Telehealth: Payer: Self-pay | Admitting: *Deleted

## 2016-09-16 NOTE — Telephone Encounter (Signed)
Great that she has been able to taper down.  Refill #30 and continue to taper back, as discussed.

## 2016-09-16 NOTE — Telephone Encounter (Signed)
I called the pt and she stated she has been taking 1-1.5 pills a day recently.  Message sent to Dr Elease Hashimoto.

## 2016-09-16 NOTE — Telephone Encounter (Signed)
I called the pt and informed her of the message below and she is aware the Rx was sent to her pharmacy. 

## 2016-09-16 NOTE — Telephone Encounter (Signed)
[facility] [place]

## 2016-09-16 NOTE — Telephone Encounter (Signed)
We are trying to taper back (and eventually off) Xanax.  Confirm her current dose.

## 2016-09-21 ENCOUNTER — Other Ambulatory Visit: Payer: Self-pay | Admitting: Family Medicine

## 2016-11-26 ENCOUNTER — Other Ambulatory Visit: Payer: Self-pay | Admitting: Family Medicine

## 2016-12-08 ENCOUNTER — Encounter: Payer: Self-pay | Admitting: Family Medicine

## 2017-01-11 ENCOUNTER — Other Ambulatory Visit: Payer: Self-pay | Admitting: Family Medicine

## 2017-01-19 ENCOUNTER — Ambulatory Visit: Payer: Medicare Other | Admitting: Obstetrics and Gynecology

## 2017-03-17 ENCOUNTER — Other Ambulatory Visit: Payer: Self-pay | Admitting: Family Medicine

## 2017-04-03 ENCOUNTER — Telehealth: Payer: Self-pay | Admitting: Family Medicine

## 2017-04-03 NOTE — Telephone Encounter (Signed)
Copied from [place] [phone] [phone]. Topic: Appointment Scheduling - Prior Auth Required for Appointment >> Apr 03, 2017  2:35 PM [name] wrote: Pt is requesting her lab work she says, and B12.  Call patient back [phone]

## 2017-04-03 NOTE — Telephone Encounter (Signed)
Which labs is she requesting?

## 2017-04-04 NOTE — Telephone Encounter (Signed)
Copied from [place] [id number]. Topic: Appointment Scheduling - Prior Auth Required for Appointment >> Apr 04, 2017 10:19 AM Boyd Kerbs wrote: Patient is requesting B-12 but also a full panel,  Celllalitis disease (wants to see about Molinda Bailiff is good with her)

## 2017-04-04 NOTE — Telephone Encounter (Signed)
Recommend follow up to discuss prior to labs-so we can be on same page about what she needs.

## 2017-04-05 NOTE — Telephone Encounter (Signed)
Appointment made

## 2017-04-07 ENCOUNTER — Ambulatory Visit: Payer: Medicare Other | Admitting: Family Medicine

## 2017-04-07 VITALS — BP 100/80 | HR 97 | Temp 98.2°F | Ht 66.0 in | Wt 234.6 lb

## 2017-04-07 DIAGNOSIS — R5383 Other fatigue: Secondary | ICD-10-CM

## 2017-04-07 DIAGNOSIS — E538 Deficiency of other specified B group vitamins: Secondary | ICD-10-CM | POA: Diagnosis not present

## 2017-04-07 DIAGNOSIS — I10 Essential (primary) hypertension: Secondary | ICD-10-CM

## 2017-04-07 DIAGNOSIS — Z862 Personal history of diseases of the blood and blood-forming organs and certain disorders involving the immune mechanism: Secondary | ICD-10-CM

## 2017-04-07 DIAGNOSIS — R140 Abdominal distension (gaseous): Secondary | ICD-10-CM

## 2017-04-07 DIAGNOSIS — M791 Myalgia, unspecified site: Secondary | ICD-10-CM

## 2017-04-07 LAB — BASIC METABOLIC PANEL
BUN: 16 mg/dL (ref 6–23)
CALCIUM: 9.4 mg/dL (ref 8.4–10.5)
CO2: 27 mEq/L (ref 19–32)
Chloride: 101 mEq/L (ref 96–112)
Creatinine, Ser: 1.05 mg/dL (ref 0.40–1.20)
GFR: 57.32 mL/min — AB (ref >60.00)
Glucose, Bld: 106 mg/dL — ABNORMAL HIGH (ref 70–99)
Potassium: 3.2 mEq/L — ABNORMAL LOW (ref 3.5–5.1)
SODIUM: 140 meq/L (ref 135–145)

## 2017-04-07 LAB — TSH: TSH: 3.65 u[IU]/mL (ref 0.35–4.50)

## 2017-04-07 LAB — CBC WITH DIFFERENTIAL/PLATELET
BASOS ABS: 0.1 10*3/uL (ref 0.0–0.1)
Basophils Relative: 0.9 % (ref 0.0–3.0)
EOS ABS: 0.2 10*3/uL (ref 0.0–0.7)
Eosinophils Relative: 3.1 % (ref 0.0–5.0)
HCT: 37.3 % (ref 36.0–46.0)
Hemoglobin: 12.8 g/dL (ref 12.0–15.0)
LYMPHS ABS: 2.2 10*3/uL (ref 0.7–4.0)
Lymphocytes Relative: 31 % (ref 12.0–46.0)
MCHC: 34.4 g/dL (ref 30.0–36.0)
MCV: 90.4 fl (ref 78.0–100.0)
MONO ABS: 0.4 10*3/uL (ref 0.1–1.0)
Monocytes Relative: 5.5 % (ref 3.0–12.0)
NEUTROS PCT: 59.5 % (ref 43.0–77.0)
Neutro Abs: 4.3 10*3/uL (ref 1.4–7.7)
Platelets: 236 10*3/uL (ref 150.0–400.0)
RBC: 4.13 Mil/uL (ref 3.87–5.11)
RDW: 12.7 % (ref 11.5–15.5)
WBC: 7.3 10*3/uL (ref 4.0–10.5)

## 2017-04-07 LAB — VITAMIN D 25 HYDROXY (VIT D DEFICIENCY, FRACTURES): VITD: 23.58 ng/mL — AB (ref 30.00–100.00)

## 2017-04-07 LAB — VITAMIN B12: Vitamin B-12: 242 pg/mL (ref 211–911)

## 2017-04-07 MED ORDER — DOXYCYCLINE HYCLATE 100 MG PO CAPS: 100.0000 mg | ORAL_CAPSULE | Freq: Two times a day (BID) | ORAL | 0 refills | Status: DC

## 2017-04-07 NOTE — Progress Notes (Signed)
Subjective:     Patient ID: Taylor Ritter, female   DOB: [birth year], 57 y.o.   MRN: [id number]  HPI Patient called recently requesting multiple labs. She has known history of B12 deficiency and is requesting B12 level. She also complains of the following issues  Long-term history of frequent bloating and sometimes loose stools. She states her father had "anemia". Apparently undetermined etiology. Patient is specifically concerned whether she may have celiac disease. She has frequent arthralgias. Never tested previously. Briefly went gluten-free but only for couple weeks.  Complains of generalized fatigue. No clinical suspicion for sleep apnea. Generally gets plenty of sleep. Also complains of frequent achy sensation in her lower legs especially. This occurs more at night. No restless leg symptoms.  History of hypertension. Currently on HCTZ. She has lost about 40 pounds this year due to her efforts and blood pressures recently been running on the low side  Also complains of almost 2 week history of pressure maxillary sinuses with some discolored drainage and bloody drainage past few days.  No fever.  Past Medical History:  Diagnosis Date  . Allergy    takes Allegra daily and uses nasal spray daily.Takes Benadryl as needed.Uses eye drops daily  . Anemia    B 12 shots monthly  . Anxiety    takes Xanax as needed  . Arthritis    osteoporosis  . Asthma    Proventil inhaler as needed  . Chronic back pain    DDD and stenosis  . Complication of anesthesia    pt states she was woke up during surgery  . Depression    takes Wellbutrin daily  . Fibromyalgia   . GERD (gastroesophageal reflux disease)    takes Nexium daily  . Headache(784.0)    takes Topamax daily as needed  . History of bronchitis    2016  . History of colon polyps    benign  . History of vertigo    took Meclizine several yrs ago  . Hypertension    followed by [facility]  . IBS (irritable  bowel syndrome)    takes LInzess daily  . Insomnia   . Joint pain   . Muscle spasm    takes Flexeril daily as needed  . Neuromuscular disorder (HCC)    numbness arms legs,feet   . Nocturia   . Osteoporosis   . Pain    takes Keppra and Topamax daily  . Peripheral edema    takes HCTZ daily  . Sleep apnea    has a CPAP but doesn't wear often  . Urinary frequency   . Urinary incontinence   . Urinary urgency    Past Surgical History:  Procedure Laterality Date  . ABDOMINAL HYSTERECTOMY    . ANTERIOR FUSION CERVICAL SPINE    . BACK SURGERY  2012  . bladder tacked    . CARPAL TUNNEL RELEASE     R&L  . CESAREAN SECTION  1983  . CHOLECYSTECTOMY    . COLONOSCOPY    . ESOPHAGEAL DILATION    . KNEE ARTHROSCOPY     L KNEE  . LUMBAR LAMINECTOMY    . LUMBAR LAMINECTOMY/DECOMPRESSION MICRODISCECTOMY  02/25/2011   Procedure: LUMBAR LAMINECTOMY/DECOMPRESSION MICRODISCECTOMY;  Surgeon: Floyce Stakes;  Location: [place] [facility];  Service: Neurosurgery;  Laterality: N/A;  Lumbar Four-Five Microdiscectomy  . LUMBAR WOUND DEBRIDEMENT  03/18/2011   Procedure: LUMBAR WOUND DEBRIDEMENT;  Surgeon: Floyce Stakes;  Location: [place] [facility];  Service: Neurosurgery;  Laterality: N/A;  Exploration of Lumbar wound  . SEPTOPLASTY  10+yrs ago  . SPINAL CORD STIMULATOR INSERTION N/A 12/11/2015   Procedure: LUMBAR SPINAL CORD STIMULATOR INSERTION;  Surgeon: Clydell Hakim, MD;  Location: [place] [facility];  Service: Neurosurgery;  Laterality: N/A;    reports that  has never smoked. she has never used smokeless tobacco. She reports that she does not drink alcohol or use drugs. family history includes Arthritis in her father and mother; COPD in her mother; Cancer in her father, maternal grandfather, and maternal grandmother; Diabetes in her father, maternal grandfather, and mother; Early death in her brother; Heart disease in her father; Heart disease (age of onset: 69) in her mother; Hypertension in her mother; Kidney  disease in her mother; Mental illness in her father and mother. Allergies  Allergen Reactions  . Other Other (See Comments)    Nylon sutures had to remove to allow healing  Vicryl sutures - do not use  . Percocet [Oxycodone-Acetaminophen] Other (See Comments)    "makes her crazy"  . Codeine Hives  . Dye Fdc Red [Red Dye] Hives    Scan dye, broke out in hives all over once with a neck scan  . Morphine And Related Hives and Itching  . Vesicare [Solifenacin Succinate] Itching     Review of Systems  Constitutional: Positive for fatigue.  Eyes: Negative for visual disturbance.  Respiratory: Negative for cough, chest tightness, shortness of breath and wheezing.   Cardiovascular: Negative for chest pain, palpitations and leg swelling.  Gastrointestinal: Negative for diarrhea and vomiting.  Endocrine: Negative for polydipsia and polyuria.  Genitourinary: Negative for dysuria.  Musculoskeletal: Positive for arthralgias.  Neurological: Negative for dizziness, seizures, syncope, weakness, light-headedness and headaches.       Objective:   Physical Exam  Constitutional: She appears well-developed and well-nourished.  HENT:  Right Ear: External ear normal.  Left Ear: External ear normal.  Mouth/Throat: Oropharynx is clear and moist. No oropharyngeal exudate.  Erythematous nasal mucosa otherwise clear  Neck: Neck supple.  Cardiovascular: Normal rate and regular rhythm.  Pulmonary/Chest: Effort normal and breath sounds normal. No respiratory distress. She has no wheezes. She has no rales.  Musculoskeletal: She exhibits no edema.  Lymphadenopathy:    She has no cervical adenopathy.       Assessment:     #1 hypertension stable and at goal  #2 history of B12 deficiency  #3 patient concern for possible celiac disease based on history of frequent abdominal bloating and past history of mild anemia  #4 possible acute sinusitis  #5 fatigue    Plan:     -Check further labs with  basic metabolic panel, CBC, C58, celiac panel, TSH, 25 hydroxy vitamin D -Reduce HCTZ to 25 mg to one half tablet daily -Continue weight loss efforts -Prescription for doxycycline 100 mg twice daily for 10 days to start if she has any persistent or progressive sinusitis symptoms  Eulas Post MD [facility] at [facility]

## 2017-04-07 NOTE — Patient Instructions (Signed)
Decrease the HCTZ to one half tablet daily.

## 2017-04-10 ENCOUNTER — Other Ambulatory Visit: Payer: Self-pay | Admitting: Family Medicine

## 2017-04-10 DIAGNOSIS — E876 Hypokalemia: Secondary | ICD-10-CM

## 2017-04-10 LAB — CELIAC DISEASE COMPREHENSIVE PANEL WITH REFLEXES
(tTG) Ab, IgA: 1 U/mL
IMMUNOGLOBULIN A: 394 mg/dL (ref 81–463)

## 2017-04-10 MED ORDER — CYANOCOBALAMIN 1000 MCG/ML IJ SOLN: 1000.0000 ug | INTRAMUSCULAR | 1 refills | Status: DC

## 2017-04-10 MED ORDER — POTASSIUM CHLORIDE CRYS ER 10 MEQ PO TBCR: 10.0000 meq | EXTENDED_RELEASE_TABLET | Freq: Two times a day (BID) | ORAL | 0 refills | Status: DC

## 2017-04-10 MED ORDER — "SYRINGE/NEEDLE (DISP) 25G X 1"" 3 ML MISC": 0 refills | Status: DC

## 2017-04-26 ENCOUNTER — Other Ambulatory Visit (INDEPENDENT_AMBULATORY_CARE_PROVIDER_SITE_OTHER): Payer: Medicare Other

## 2017-04-26 DIAGNOSIS — E876 Hypokalemia: Secondary | ICD-10-CM

## 2017-04-26 LAB — BASIC METABOLIC PANEL
BUN: 13 mg/dL (ref 6–23)
CALCIUM: 9.3 mg/dL (ref 8.4–10.5)
CO2: 28 mEq/L (ref 19–32)
CREATININE: 1.12 mg/dL (ref 0.40–1.20)
Chloride: 104 mEq/L (ref 96–112)
GFR: 53.19 mL/min — ABNORMAL LOW (ref >60.00)
Glucose, Bld: 142 mg/dL — ABNORMAL HIGH (ref 70–99)
Potassium: 3.6 mEq/L (ref 3.5–5.1)
SODIUM: 140 meq/L (ref 135–145)

## 2017-04-27 ENCOUNTER — Other Ambulatory Visit: Payer: Self-pay

## 2017-05-02 ENCOUNTER — Other Ambulatory Visit: Payer: Self-pay | Admitting: Family Medicine

## 2017-05-09 ENCOUNTER — Other Ambulatory Visit: Payer: Self-pay | Admitting: Family Medicine

## 2017-06-20 ENCOUNTER — Other Ambulatory Visit: Payer: Self-pay | Admitting: Family Medicine

## 2017-07-03 ENCOUNTER — Telehealth: Payer: Self-pay | Admitting: Family Medicine

## 2017-07-03 MED ORDER — ALPRAZOLAM 1 MG PO TABS: 1.0000 mg | ORAL_TABLET | Freq: Three times a day (TID) | ORAL | 0 refills | Status: DC | PRN

## 2017-07-03 NOTE — Telephone Encounter (Signed)
Patient sent a great job tapering basically off Xanax. She has upcoming trip to [place] soon is requesting one refill takes sparingly. We agreed to one refill but encouraged her to avoid regular use

## 2017-08-21 ENCOUNTER — Other Ambulatory Visit (INDEPENDENT_AMBULATORY_CARE_PROVIDER_SITE_OTHER): Payer: Medicare Other

## 2017-08-21 ENCOUNTER — Other Ambulatory Visit: Payer: Self-pay | Admitting: Family Medicine

## 2017-08-21 DIAGNOSIS — E538 Deficiency of other specified B group vitamins: Secondary | ICD-10-CM

## 2017-08-21 DIAGNOSIS — R5383 Other fatigue: Secondary | ICD-10-CM

## 2017-08-21 DIAGNOSIS — I10 Essential (primary) hypertension: Secondary | ICD-10-CM

## 2017-08-21 DIAGNOSIS — M791 Myalgia, unspecified site: Secondary | ICD-10-CM

## 2017-08-21 LAB — BASIC METABOLIC PANEL
BUN: 8 mg/dL (ref 6–23)
CHLORIDE: 99 meq/L (ref 96–112)
CO2: 29 mEq/L (ref 19–32)
CREATININE: 1.08 mg/dL (ref 0.40–1.20)
Calcium: 9.8 mg/dL (ref 8.4–10.5)
GFR: 55.41 mL/min — ABNORMAL LOW (ref >60.00)
Glucose, Bld: 111 mg/dL — ABNORMAL HIGH (ref 70–99)
Potassium: 3.6 mEq/L (ref 3.5–5.1)
Sodium: 138 mEq/L (ref 135–145)

## 2017-08-21 LAB — VITAMIN B12: VITAMIN B 12: 630 pg/mL (ref 211–911)

## 2017-09-13 ENCOUNTER — Other Ambulatory Visit: Payer: Self-pay | Admitting: Gastroenterology

## 2017-09-20 ENCOUNTER — Other Ambulatory Visit: Payer: Self-pay | Admitting: *Deleted

## 2017-09-20 ENCOUNTER — Ambulatory Visit: Payer: Medicare Other | Admitting: Family Medicine

## 2017-09-20 ENCOUNTER — Encounter: Payer: Self-pay | Admitting: Family Medicine

## 2017-09-20 VITALS — BP 120/72 | HR 89 | Temp 98.3°F | Wt 239.9 lb

## 2017-09-20 DIAGNOSIS — L608 Other nail disorders: Secondary | ICD-10-CM | POA: Diagnosis not present

## 2017-09-20 NOTE — Patient Instructions (Signed)
You appear to have green nail syndrome- which is pseudomonas infection in the nail  Can try topical treatments as per handout.  Keep hands out of warm water as much as possible  We could look at oral antibiotics if not clearing with above.

## 2017-09-20 NOTE — Progress Notes (Signed)
Subjective:     Patient ID: Taylor Ritter, female   DOB: Jun 02, 1960, 57 y.o.   MRN: [id number]  HPI Patient seen with chief complaint of greenish discoloration of some finger nails. She gets her hands manicured and polished frequently. She recently had polish removed and noticed a greenish discoloration the left index and right second, third, and fourth nails. No pain. Her nails are fairly long. No history of diabetes. No toenail discoloration  Past Medical History:  Diagnosis Date  . Allergy    takes Allegra daily and uses nasal spray daily.Takes Benadryl as needed.Uses eye drops daily  . Anemia    B 12 shots monthly  . Anxiety    takes Xanax as needed  . Arthritis    osteoporosis  . Asthma    Proventil inhaler as needed  . Chronic back pain    DDD and stenosis  . Complication of anesthesia    pt states she was woke up during surgery  . Depression    takes Wellbutrin daily  . Fibromyalgia   . GERD (gastroesophageal reflux disease)    takes Nexium daily  . Headache(784.0)    takes Topamax daily as needed  . History of bronchitis    2016  . History of colon polyps    benign  . History of vertigo    took Meclizine several yrs ago  . Hypertension    followed by [facility]  . IBS (irritable bowel syndrome)    takes LInzess daily  . Insomnia   . Joint pain   . Muscle spasm    takes Flexeril daily as needed  . Neuromuscular disorder (HCC)    numbness arms legs,feet   . Nocturia   . Osteoporosis   . Pain    takes Keppra and Topamax daily  . Peripheral edema    takes HCTZ daily  . Sleep apnea    has a CPAP but doesn't wear often  . Urinary frequency   . Urinary incontinence   . Urinary urgency    Past Surgical History:  Procedure Laterality Date  . ABDOMINAL HYSTERECTOMY    . ANTERIOR FUSION CERVICAL SPINE    . BACK SURGERY  2012  . bladder tacked    . CARPAL TUNNEL RELEASE     R&L  . CESAREAN SECTION  1983  . CHOLECYSTECTOMY    .  COLONOSCOPY    . ESOPHAGEAL DILATION    . KNEE ARTHROSCOPY     L KNEE  . LUMBAR LAMINECTOMY    . LUMBAR LAMINECTOMY/DECOMPRESSION MICRODISCECTOMY  02/25/2011   Procedure: LUMBAR LAMINECTOMY/DECOMPRESSION MICRODISCECTOMY;  Surgeon: Floyce Stakes;  Location: [place] [facility];  Service: Neurosurgery;  Laterality: N/A;  Lumbar Four-Five Microdiscectomy  . LUMBAR WOUND DEBRIDEMENT  03/18/2011   Procedure: LUMBAR WOUND DEBRIDEMENT;  Surgeon: Floyce Stakes;  Location: [place] [facility];  Service: Neurosurgery;  Laterality: N/A;  Exploration of Lumbar wound  . SEPTOPLASTY  10+yrs ago  . SPINAL CORD STIMULATOR INSERTION N/A 12/11/2015   Procedure: LUMBAR SPINAL CORD STIMULATOR INSERTION;  Surgeon: Clydell Hakim, MD;  Location: [place] [facility];  Service: Neurosurgery;  Laterality: N/A;    reports that she has never smoked. She has never used smokeless tobacco. She reports that she does not drink alcohol or use drugs. family history includes Arthritis in her father and mother; COPD in her mother; Cancer in her father, maternal grandfather, and maternal grandmother; Diabetes in her father, maternal grandfather, and mother; Early death in her  brother; Heart disease in her father; Heart disease (age of onset: 15) in her mother; Hypertension in her mother; Kidney disease in her mother; Mental illness in her father and mother. Allergies  Allergen Reactions  . Other Other (See Comments)    Nylon sutures had to remove to allow healing  Vicryl sutures - do not use  . Percocet [Oxycodone-Acetaminophen] Other (See Comments)    "makes her crazy"  . Codeine Hives  . Dye Fdc Red [Red Dye] Hives    Scan dye, broke out in hives all over once with a neck scan  . Morphine And Related Hives and Itching  . Vesicare [Solifenacin Succinate] Itching     Review of Systems  Constitutional: Negative for chills and fever.       Objective:   Physical Exam  Constitutional: She appears well-developed.  Cardiovascular: Normal  rate and regular rhythm.  Skin:  Patient has some greenish discoloration involving the left index, right second, third, and fourth nails. She does not have any brittle changes or any abnormal thickening. No changes of paronychia such as skin erythema       Assessment:     Green nails involving several digits as above. We explained this is related to moisture and Pseudomonas.    Plan:     -Keep hands out of water's much as possible -We explained that trimming her nails would help prevent further recurrence though she is not willing at this point -Consider topical treatment for such as Clorox diluted 1-4 and use 2-3 times daily. -Could use oral quinolone but would try topical treatment first  Eulas Post MD [facility] at [facility]

## 2017-10-05 ENCOUNTER — Other Ambulatory Visit: Payer: Self-pay

## 2017-10-05 ENCOUNTER — Encounter (HOSPITAL_COMMUNITY): Payer: Self-pay

## 2017-10-06 ENCOUNTER — Ambulatory Visit (HOSPITAL_COMMUNITY): Payer: Medicare Other | Admitting: Anesthesiology

## 2017-10-06 ENCOUNTER — Encounter (HOSPITAL_COMMUNITY): Payer: Self-pay | Admitting: *Deleted

## 2017-10-06 ENCOUNTER — Encounter (HOSPITAL_COMMUNITY)
Admission: RE | Disposition: A | Discharge status: Home / Self Care | Payer: Self-pay | Source: Ambulatory Visit | Attending: Gastroenterology

## 2017-10-06 ENCOUNTER — Ambulatory Visit (HOSPITAL_COMMUNITY)
Admission: RE | Admit: 2017-10-06 | Discharge: 2017-10-06 | Disposition: A | Discharge status: Home / Self Care | Payer: Medicare Other | Source: Ambulatory Visit | Attending: Gastroenterology | Admitting: Gastroenterology

## 2017-10-06 DIAGNOSIS — M62838 Other muscle spasm: Secondary | ICD-10-CM | POA: Insufficient documentation

## 2017-10-06 DIAGNOSIS — K581 Irritable bowel syndrome with constipation: Secondary | ICD-10-CM | POA: Diagnosis not present

## 2017-10-06 DIAGNOSIS — J45909 Unspecified asthma, uncomplicated: Secondary | ICD-10-CM | POA: Insufficient documentation

## 2017-10-06 DIAGNOSIS — Z1211 Encounter for screening for malignant neoplasm of colon: Secondary | ICD-10-CM | POA: Diagnosis present

## 2017-10-06 DIAGNOSIS — Z86010 Personal history of colonic polyps: Secondary | ICD-10-CM | POA: Diagnosis not present

## 2017-10-06 DIAGNOSIS — R609 Edema, unspecified: Secondary | ICD-10-CM | POA: Diagnosis not present

## 2017-10-06 DIAGNOSIS — F329 Major depressive disorder, single episode, unspecified: Secondary | ICD-10-CM | POA: Insufficient documentation

## 2017-10-06 DIAGNOSIS — Z79899 Other long term (current) drug therapy: Secondary | ICD-10-CM | POA: Insufficient documentation

## 2017-10-06 DIAGNOSIS — D649 Anemia, unspecified: Secondary | ICD-10-CM | POA: Diagnosis not present

## 2017-10-06 DIAGNOSIS — F419 Anxiety disorder, unspecified: Secondary | ICD-10-CM | POA: Diagnosis not present

## 2017-10-06 DIAGNOSIS — I10 Essential (primary) hypertension: Secondary | ICD-10-CM | POA: Diagnosis not present

## 2017-10-06 DIAGNOSIS — R51 Headache: Secondary | ICD-10-CM | POA: Insufficient documentation

## 2017-10-06 DIAGNOSIS — D175 Benign lipomatous neoplasm of intra-abdominal organs: Secondary | ICD-10-CM | POA: Diagnosis not present

## 2017-10-06 DIAGNOSIS — G4730 Sleep apnea, unspecified: Secondary | ICD-10-CM | POA: Diagnosis not present

## 2017-10-06 DIAGNOSIS — K219 Gastro-esophageal reflux disease without esophagitis: Secondary | ICD-10-CM | POA: Insufficient documentation

## 2017-10-06 DIAGNOSIS — D123 Benign neoplasm of transverse colon: Secondary | ICD-10-CM | POA: Diagnosis not present

## 2017-10-06 HISTORY — PX: POLYPECTOMY: SHX5525

## 2017-10-06 HISTORY — PX: COLONOSCOPY WITH PROPOFOL: SHX5780

## 2017-10-06 SURGERY — COLONOSCOPY WITH PROPOFOL
Anesthesia: Monitor Anesthesia Care

## 2017-10-06 MED ORDER — LIDOCAINE 2% (20 MG/ML) 5 ML SYRINGE
INTRAMUSCULAR | Status: DC | PRN
  Administered 2017-10-06: 60 mg via INTRAVENOUS

## 2017-10-06 MED ORDER — PROPOFOL 10 MG/ML IV BOLUS
INTRAVENOUS | Status: AC
  Filled 2017-10-06: qty 20

## 2017-10-06 MED ORDER — PROPOFOL 10 MG/ML IV BOLUS
INTRAVENOUS | Status: AC
  Filled 2017-10-06: qty 40

## 2017-10-06 MED ORDER — SODIUM CHLORIDE 0.9 % IV SOLN: INTRAVENOUS | Status: DC

## 2017-10-06 MED ORDER — LACTATED RINGERS IV SOLN
INTRAVENOUS | Status: DC
  Administered 2017-10-06 (×2): via INTRAVENOUS

## 2017-10-06 MED ORDER — PROPOFOL 500 MG/50ML IV EMUL
INTRAVENOUS | Status: DC | PRN
  Administered 2017-10-06: 300 ug/kg/min via INTRAVENOUS

## 2017-10-06 SURGICAL SUPPLY — 21 items

## 2017-10-06 NOTE — H&P (Signed)
Taylor Ritter HPI: Her colonoscopy on 06/2007 was positive for 4 adenomas and three of the adenomas were in the cecum. The follow up colonoscopy on 08/23/2012 was a normal examination. She has issues with constipation predominant IBS and she takes Linzess 290 mcg BID. [place]  Past Medical History:  Diagnosis Date  . Allergy    takes Allegra daily and uses nasal spray daily.Takes Benadryl as needed.Uses eye drops daily  . Anemia    B 12 shots monthly  . Anxiety    takes Xanax as needed  . Arthritis    osteoporosis  . Asthma    Proventil inhaler as needed  . Chronic back pain    DDD and stenosis  . Complication of anesthesia    pt states she was woke up during surgery  . Depression    takes Wellbutrin daily  . Fibromyalgia   . GERD (gastroesophageal reflux disease)    takes Nexium daily  . Headache(784.0)    takes Topamax daily as needed  . History of bronchitis    2016  . History of colon polyps    benign  . History of vertigo    took Meclizine several yrs ago  . Hypertension    followed by [facility]  . IBS (irritable bowel syndrome)    takes LInzess daily  . Insomnia   . Joint pain   . Muscle spasm    takes Flexeril daily as needed  . Neuromuscular disorder (HCC)    numbness arms legs,feet   . Nocturia   . Osteoporosis   . Pain    takes Keppra and Topamax daily  . Peripheral edema    takes HCTZ daily  . Sleep apnea    has a CPAP but doesn't wear often  . Urinary frequency   . Urinary incontinence   . Urinary urgency     Past Surgical History:  Procedure Laterality Date  . ABDOMINAL HYSTERECTOMY    . ANTERIOR FUSION CERVICAL SPINE    . BACK SURGERY  2012  . bladder tacked    . CARPAL TUNNEL RELEASE     R&L  . CESAREAN SECTION  1983  . CHOLECYSTECTOMY    . COLONOSCOPY    . ESOPHAGEAL DILATION    . KNEE ARTHROSCOPY     L KNEE  . LUMBAR LAMINECTOMY    . LUMBAR  LAMINECTOMY/DECOMPRESSION MICRODISCECTOMY  02/25/2011   Procedure: LUMBAR LAMINECTOMY/DECOMPRESSION MICRODISCECTOMY;  Surgeon: Floyce Stakes;  Location: [place] [facility];  Service: Neurosurgery;  Laterality: N/A;  Lumbar Four-Five Microdiscectomy  . LUMBAR WOUND DEBRIDEMENT  03/18/2011   Procedure: LUMBAR WOUND DEBRIDEMENT;  Surgeon: Floyce Stakes;  Location: [place] [facility];  Service: Neurosurgery;  Laterality: N/A;  Exploration of Lumbar wound  . SEPTOPLASTY  10+yrs ago  . SPINAL CORD STIMULATOR INSERTION N/A 12/11/2015   Procedure: LUMBAR SPINAL CORD STIMULATOR INSERTION;  Surgeon: Clydell Hakim, MD;  Location: [place] [facility];  Service: Neurosurgery;  Laterality: N/A;    Family History  Problem Relation Age of Onset  . Arthritis Mother   . Hypertension Mother   . Kidney disease Mother   . COPD Mother   . Heart disease Mother 39       CAD  . Diabetes Mother   . Mental illness Mother   . Arthritis Father   . Diabetes Father   . Heart disease Father   . Cancer Father  prostate cancer  . Mental illness Father        Alzheimers Dementia  . Cancer Maternal Grandmother        brain tumor  . Cancer Maternal Grandfather        lung  . Diabetes Maternal Grandfather   . Early death Brother   . Anesthesia problems Neg Hx   . Hypotension Neg Hx   . Malignant hyperthermia Neg Hx   . Pseudochol deficiency Neg Hx     Social History:  reports that she has never smoked. She has never used smokeless tobacco. She reports that she does not drink alcohol or use drugs.  Allergies:  Allergies  Allergen Reactions  . Other Other (See Comments)    Nylon sutures had to remove to allow healing  Vicryl sutures - do not use  . Percocet [Oxycodone-Acetaminophen] Other (See Comments)    "makes her crazy"  . Codeine Hives  . Dye Fdc Red [Red Dye] Hives and Other (See Comments)    Scan dye, broke out in hives all over once with a neck scan  . Morphine And Related Hives and Itching  . Vesicare  [Solifenacin Succinate] Itching    Medications:  Scheduled:  Continuous: . lactated ringers 10 mL/hr at 10/06/17 1201    No results found for this or any previous visit (from the past 24 hour(s)).   No results found.  ROS:  As stated above in the HPI otherwise negative.  Blood pressure 129/82, pulse 77, temperature (!) 77 F (25 C), temperature source Oral, resp. rate 12, height 5\' 6"  (1.676 m), weight 108.4 kg (239 lb), SpO2 98 %.    PE: Gen: NAD, Alert and Oriented HEENT:  [place]/AT, EOMI Neck: Supple, no LAD Lungs: CTA Bilaterally CV: RRR without M/G/R ABM: Soft, NTND, +BS Ext: No C/C/E  Assessment/Plan: 1) Personal history of polyps - Colonoscopy.  Banks Chaikin D 10/06/2017, 12:29 PM

## 2017-10-06 NOTE — Transfer of Care (Signed)
Immediate Anesthesia Transfer of Care Note  Patient: Taylor Ritter  Procedure(s) Performed: COLONOSCOPY WITH PROPOFOL (N/A ) POLYPECTOMY BIOPSY  Patient Location: PACU  Anesthesia Type:MAC  Level of Consciousness: awake, alert , oriented and patient cooperative  Airway & Oxygen Therapy: Patient Spontanous Breathing and Patient connected to face mask oxygen  Post-op Assessment: Report given to RN, Post -op Vital signs reviewed and stable and Patient moving all extremities X 4  Post vital signs: stable  Last Vitals:  Vitals Value Taken Time  BP 119/65 10/06/2017  1:22 PM  Temp 36.7 C 10/06/2017  1:22 PM  Pulse 79 10/06/2017  1:25 PM  Resp 13 10/06/2017  1:25 PM  SpO2 100 % 10/06/2017  1:25 PM  Vitals shown include unvalidated device data.  Last Pain:  Vitals:   10/06/17 1151  TempSrc: Oral  PainSc: 0-No pain         Complications: No apparent anesthesia complications

## 2017-10-06 NOTE — Op Note (Signed)
[facility] Patient Name: Taylor Ritter Procedure Date: 10/06/2017 MRN: [id number] Attending MD: Carol Ada , MD Date of Birth: [birth year] CSN: [id number] Age: 57 Admit Type: Inpatient Procedure:                Colonoscopy Indications:              High risk colon cancer surveillance: Personal                            history of colonic polyps Providers:                Carol Ada, MD, Elmer Ramp. Tilden Dome, RN, Alan Mulder, Technician Referring MD:              Medicines:                Propofol total dose 962 mg IV Complications:            No immediate complications. Estimated Blood Loss:     Estimated blood loss was minimal. Procedure:                Pre-Anesthesia Assessment:                           - Prior to the procedure, a History and Physical                            was performed, and patient medications and                            allergies were reviewed. The patient's tolerance of                            previous anesthesia was also reviewed. The risks                            and benefits of the procedure and the sedation                            options and risks were discussed with the patient.                            All questions were answered, and informed consent                            was obtained. Prior Anticoagulants: The patient has                            taken no previous anticoagulant or antiplatelet                            agents. ASA Grade Assessment: III - A patient with  severe systemic disease. After reviewing the risks                            and benefits, the patient was deemed in                            satisfactory condition to undergo the procedure.                           - Sedation was administered by an anesthesia                            professional. Deep sedation was attained.                           After obtaining informed consent,  the colonoscope                            was passed under direct vision. Throughout the                            procedure, the patient's blood pressure, pulse, and                            oxygen saturations were monitored continuously. The                            EC-[id number] ([id number]) scope was introduced through                            the anus and advanced to the the terminal ileum.                            The colonoscopy was performed without difficulty.                            The patient tolerated the procedure well. The                            quality of the bowel preparation was good. The                            ileocecal valve, appendiceal orifice, and rectum                            were photographed. Scope In: 12:48:27 PM Scope Out: 1:12:02 PM Scope Withdrawal Time: 0 hours 20 minutes 45 seconds  Total Procedure Duration: 0 hours 23 minutes 35 seconds  Findings:      Four sessile polyps were found in the transverse colon and hepatic       flexure. The polyps were 3 to 5 mm in size. These polyps were removed       with a cold snare. Resection and retrieval were complete.      There was a medium-sized lipoma, in the transverse colon.  Impression:               - Four 3 to 5 mm polyps in the transverse colon and                            at the hepatic flexure, removed with a cold snare.                            Resected and retrieved.                           - Medium-sized lipoma in the transverse colon. Moderate Sedation:      N/A- Per Anesthesia Care Recommendation:           - Patient has a contact number available for                            emergencies. The signs and symptoms of potential                            delayed complications were discussed with the                            patient. Return to normal activities tomorrow.                            Written discharge instructions were provided to the                             patient.                           - Resume previous diet.                           - Continue present medications.                           - Await pathology results.                           - Repeat colonoscopy in 3 years for surveillance. Procedure Code(s):        --- Professional ---                           [phone], Colonoscopy, flexible; with removal of                            tumor(s), polyp(s), or other lesion(s) by snare                            technique Diagnosis Code(s):        --- Professional ---                           Z86.010, Personal history of colonic polyps  D12.3, Benign neoplasm of transverse colon (hepatic                            flexure or splenic flexure)                           D17.5, Benign lipomatous neoplasm of                            intra-abdominal organs CPT copyright 2017 American Medical Association. All rights reserved. The codes documented in this report are preliminary and upon coder review may  be revised to meet current compliance requirements. Carol Ada, MD Carol Ada, MD 10/06/2017 1:17:50 PM This report has been signed electronically. Number of Addenda: 0

## 2017-10-06 NOTE — Anesthesia Procedure Notes (Signed)
Procedure Name: MAC Date/Time: 10/06/2017 12:41 PM Performed by: Cynda Familia, CRNA Pre-anesthesia Checklist: Patient identified, Emergency Drugs available, Suction available, Patient being monitored and Timeout performed Patient Re-evaluated:Patient Re-evaluated prior to induction Oxygen Delivery Method: Simple face mask Placement Confirmation: positive ETCO2 and breath sounds checked- equal and bilateral Dental Injury: Teeth and Oropharynx as per pre-operative assessment

## 2017-10-06 NOTE — Anesthesia Postprocedure Evaluation (Signed)
Anesthesia Post Note  Patient: Taylor Ritter  Procedure(s) Performed: COLONOSCOPY WITH PROPOFOL (N/A ) POLYPECTOMY BIOPSY     Patient location during evaluation: PACU Anesthesia Type: MAC Level of consciousness: awake and alert Pain management: pain level controlled Vital Signs Assessment: post-procedure vital signs reviewed and stable Respiratory status: spontaneous breathing, nonlabored ventilation, respiratory function stable and patient connected to nasal cannula oxygen Cardiovascular status: stable and blood pressure returned to baseline Postop Assessment: no apparent nausea or vomiting Anesthetic complications: no    Last Vitals:  Vitals:   10/06/17 1332 10/06/17 1342  BP: 119/66 116/72  Pulse: 76 75  Resp: 16 14  Temp:    SpO2: 96% 97%    Last Pain:  Vitals:   10/06/17 1342  TempSrc:   PainSc: 2                  Darald Uzzle COKER

## 2017-10-06 NOTE — Anesthesia Preprocedure Evaluation (Signed)
Anesthesia Evaluation  Patient identified by MRN, date of birth, ID band Patient awake    Reviewed: Allergy & Precautions, NPO status , Patient's Chart, lab work & pertinent test results  Airway Mallampati: II  TM Distance: >3 FB Neck ROM: Full    Dental  (+) Teeth Intact, Dental Advisory Given   Pulmonary    breath sounds clear to auscultation       Cardiovascular hypertension,  Rhythm:Regular Rate:Normal     Neuro/Psych    GI/Hepatic   Endo/Other    Renal/GU      Musculoskeletal   Abdominal   Peds  Hematology   Anesthesia Other Findings   Reproductive/Obstetrics                             Anesthesia Physical Anesthesia Plan  ASA: III  Anesthesia Plan: MAC   Post-op Pain Management:    Induction: Intravenous  PONV Risk Score and Plan: Propofol infusion  Airway Management Planned: Natural Airway and Simple Face Mask  Additional Equipment:   Intra-op Plan:   Post-operative Plan:   Informed Consent: I have reviewed the patients History and Physical, chart, labs and discussed the procedure including the risks, benefits and alternatives for the proposed anesthesia with the patient or authorized representative who has indicated his/her understanding and acceptance.   Dental advisory given  Plan Discussed with: CRNA and Anesthesiologist  Anesthesia Plan Comments:         Anesthesia Quick Evaluation

## 2017-10-06 NOTE — Discharge Instructions (Signed)

## 2017-10-09 ENCOUNTER — Encounter (HOSPITAL_COMMUNITY): Payer: Self-pay | Admitting: Gastroenterology

## 2017-10-11 NOTE — Progress Notes (Signed)
57 y.o. G1P1001 MarriedCaucasianF here for annual exam.  H/O hysterectomy. No vaginal bleeding. Occasionally sexually active, uses a lubricant which helps. Previously was on vaginal estrogen, wants to go back on it.  No libido. Previously normal testosterone levels.  H/O OAB, urge incontinence, has tried multiple treatments without help. Not leaking as bad she used to leak. She has learned to stay closer to the bathroom.  She has chronic pain, adjustments are being made in her implanted nerve stimulator, hoping it will help.  She has issues with recurrent candida under her breasts, requests a new script for nystatin. Not itchy, but red and odor.     No LMP recorded. Patient has had a hysterectomy.          Sexually active: Yes.    The current method of family planning is status post hysterectomy.    Exercising: No.  The patient does not participate in regular exercise at present. Smoker:  no  Health Maintenance: Pap:  08-03-11 WNL History of abnormal Pap:  no MMG:  11-07-14 WNL  Colonoscopy:  10/06/2017 polyp, f/u in 3 years.  BMD:   never TDaP:  08-09-12 Gardasil: N/A    reports that she has never smoked. She has never used smokeless tobacco. She reports that she does not drink alcohol or use drugs. 1 daughter, 2 grandsons 3 step grandchildren. She is raising one grandchild, 66 year old boy. Raised 3 other grandchildren. On disability for pain  Past Medical History:  Diagnosis Date  . Allergy    takes Allegra daily and uses nasal spray daily.Takes Benadryl as needed.Uses eye drops daily  . Anemia    B 12 shots monthly  . Anxiety    takes Xanax as needed  . Arthritis    osteoporosis  . Asthma    Proventil inhaler as needed  . Chronic back pain    DDD and stenosis  . Complication of anesthesia    pt states she was woke up during surgery  . Depression    takes Wellbutrin daily  . Fibromyalgia   . GERD (gastroesophageal reflux disease)    takes Nexium daily  . Headache(784.0)     takes Topamax daily as needed  . History of bronchitis    2016  . History of colon polyps    benign  . History of vertigo    took Meclizine several yrs ago  . Hypertension    followed by [facility]  . IBS (irritable bowel syndrome)    takes LInzess daily  . Insomnia   . Joint pain   . Muscle spasm    takes Flexeril daily as needed  . Neuromuscular disorder (HCC)    numbness arms legs,feet   . Nocturia   . Osteoporosis   . Pain    takes Keppra and Topamax daily  . Peripheral edema    takes HCTZ daily  . Sleep apnea    has a CPAP but doesn't wear often  . Urinary frequency   . Urinary incontinence   . Urinary urgency     Past Surgical History:  Procedure Laterality Date  . ABDOMINAL HYSTERECTOMY    . ANTERIOR FUSION CERVICAL SPINE    . BACK SURGERY  2012  . bladder tacked    . CARPAL TUNNEL RELEASE     R&L  . CESAREAN SECTION  1983  . CHOLECYSTECTOMY    . COLONOSCOPY    . COLONOSCOPY WITH PROPOFOL N/A 10/06/2017   Procedure: COLONOSCOPY WITH PROPOFOL;  Surgeon: Carol Ada, MD;  Location: [facility];  Service: Endoscopy;  Laterality: N/A;  . ESOPHAGEAL DILATION    . KNEE ARTHROSCOPY     L KNEE  . LUMBAR LAMINECTOMY    . LUMBAR LAMINECTOMY/DECOMPRESSION MICRODISCECTOMY  02/25/2011   Procedure: LUMBAR LAMINECTOMY/DECOMPRESSION MICRODISCECTOMY;  Surgeon: Floyce Stakes;  Location: [facility];  Service: Neurosurgery;  Laterality: N/A;  Lumbar Four-Five Microdiscectomy  . LUMBAR WOUND DEBRIDEMENT  03/18/2011   Procedure: LUMBAR WOUND DEBRIDEMENT;  Surgeon: Floyce Stakes;  Location: [facility];  Service: Neurosurgery;  Laterality: N/A;  Exploration of Lumbar wound  . POLYPECTOMY  10/06/2017   Procedure: POLYPECTOMY;  Surgeon: Carol Ada, MD;  Location: [facility];  Service: Endoscopy;;  . SEPTOPLASTY  10+yrs ago  . SPINAL CORD STIMULATOR INSERTION N/A 12/11/2015   Procedure: LUMBAR SPINAL CORD STIMULATOR INSERTION;  Surgeon: Clydell Hakim, MD;  Location: [facility];  Service: Neurosurgery;  Laterality: N/A;    Current Outpatient Medications  Medication Sig Dispense Refill  . ALPRAZolam (XANAX) 1 MG tablet Take 1 tablet (1 mg total) by mouth 3 (three) times daily as needed for anxiety. 30 tablet 0  . azelastine (ASTELIN) 0.1 % nasal spray Place 1 spray into both nostrils 2 (two) times daily. Use in each nostril as directed    . Bepotastine Besilate (BEPREVE) 1.5 % SOLN Place 1 drop into both eyes 2 (two) times daily as needed (for dry eyes).     . budesonide-formoterol (SYMBICORT) 160-4.5 MCG/ACT inhaler Inhale 2 puffs into the lungs 2 (two) times daily.     Marland Kitchen buPROPion (WELLBUTRIN XL) 150 MG 24 hr tablet TAKE 3 TABLETS BY MOUTH  ONCE A DAY 270 tablet 1  . Calcium Carbonate-Vitamin D (CALCIUM-VITAMIN D3 PO) Take 1 tablet by mouth daily.    . cyanocobalamin (,VITAMIN B-12,) 1000 MCG/ML injection Inject 1 mL (1,000 mcg total) into the muscle every 30 (thirty) days. 10 mL 1  . diphenhydrAMINE (BENADRYL) 50 MG capsule Take 50 mg by mouth every 6 (six) hours as needed for allergies.     Marland Kitchen EPINEPHrine 0.3 mg/0.3 mL IJ SOAJ injection Inject 0.3 mg into the muscle once.     . Estradiol 10 MCG TABS vaginal tablet Place 1 tablet (10 mcg total) vaginally 2 (two) times a week. 24 tablet 3  . fexofenadine (ALLEGRA) 180 MG tablet Take 180 mg by mouth daily.      . fluticasone (FLONASE) 50 MCG/ACT nasal spray Place 2 sprays into both nostrils 2 (two) times daily.    . hydrochlorothiazide (HYDRODIURIL) 25 MG tablet TAKE 1 TABLET BY MOUTH  DAILY 90 tablet 1  . levETIRAcetam (KEPPRA) 500 MG tablet Take 1,000 mg by mouth at bedtime.     Marland Kitchen linaclotide (LINZESS) 290 MCG CAPS capsule Take 580 mcg by mouth 2 (two) times daily.     . montelukast (SINGULAIR) 10 MG tablet Take 10 mg by mouth at bedtime.    Marland Kitchen NEXIUM 40 MG capsule TAKE 1 CAPSULE BY MOUTH TWO TIMES DAILY (Patient taking differently: Take 80 mg by mouth daily) 180 capsule 1  .  pilocarpine (SALAGEN) 5 MG tablet Take 5 mg by mouth daily as needed (for dryness).     Marland Kitchen PRESCRIPTION MEDICATION Inject 1 each into the muscle 2 (two) times a week. Allergy shot     . topiramate (TOPAMAX) 100 MG tablet Take 200 mg by mouth at bedtime.     . valACYclovir (VALTREX) 500 MG tablet TAKE 1 TABLET  BY MOUTH TWO  TIMES DAILY (Patient taking differently: Take 500 mg by mouth daily. May take additional 500 mg if needed for outbreak.) 180 tablet 1  . VIIBRYD 40 MG TABS TAKE 1 TABLET BY MOUTH  DAILY 90 tablet 1  . nystatin ointment (MYCOSTATIN) Apply 1 application topically 2 (two) times daily. As needed 30 g 2   No current facility-administered medications for this visit.     Family History  Problem Relation Age of Onset  . Arthritis Mother   . Hypertension Mother   . Kidney disease Mother   . COPD Mother   . Heart disease Mother 25       CAD  . Diabetes Mother   . Mental illness Mother   . Arthritis Father   . Diabetes Father   . Heart disease Father   . Cancer Father        prostate cancer  . Mental illness Father        Alzheimers Dementia  . Cancer Maternal Grandmother        brain tumor  . Cancer Maternal Grandfather        lung  . Diabetes Maternal Grandfather   . Early death Brother   . Anesthesia problems Neg Hx   . Hypotension Neg Hx   . Malignant hyperthermia Neg Hx   . Pseudochol deficiency Neg Hx     Review of Systems  Constitutional: Negative.   HENT: Negative.   Eyes: Negative.   Respiratory: Negative.   Cardiovascular: Negative.   Gastrointestinal: Negative.   Endocrine: Negative.   Genitourinary: Negative.   Musculoskeletal:       Muscle weakness Joint pain  Skin: Negative.   Allergic/Immunologic: Negative.   Neurological: Negative.   Hematological: Negative.   Psychiatric/Behavioral:       Depression    Exam:   BP 120/72 (BP Location: Right Arm, Patient Position: Sitting)   Pulse 82   Ht 5' 5.75" (1.67 m)   Wt 243 lb 12.8 oz (110.6  kg)   BMI 39.65 kg/m   Weight change: @WEIGHTCHANGE @ Height:   Height: 5' 5.75" (167 cm)  Ht Readings from Last 3 Encounters:  10/12/17 5' 5.75" (1.67 m)  10/06/17 5\' 6"  (1.676 m)  04/07/17 5\' 6"  (1.676 m)    General appearance: alert, cooperative and appears stated age Head: Normocephalic, without obvious abnormality, atraumatic Neck: no adenopathy, supple, symmetrical, trachea midline and thyroid normal to inspection and palpation Lungs: clear to auscultation bilaterally Cardiovascular: regular rate and rhythm Breasts: normal appearance, no masses or tenderness. Erythematous rash with irregular boarder under both breasts. Abdomen: soft, non-tender; non distended,  no masses,  no organomegaly Extremities: extremities normal, atraumatic, no cyanosis or edema Skin: Skin color, texture, turgor normal. No rashes or lesions Lymph nodes: Cervical, supraclavicular, and axillary nodes normal. No abnormal inguinal nodes palpated Neurologic: Grossly normal   Pelvic: External genitalia:  no lesions              Urethra:  normal appearing urethra with no masses, tenderness or lesions              Bartholins and Skenes: normal                 Vagina: normal appearing atrophic vagina with normal color and discharge, no lesions              Cervix: absent               Bimanual Exam:  Uterus:  uterus absent              Adnexa: no mass, fullness, tenderness               Rectovaginal: Confirms               Anus:  normal sphincter tone, no lesions  Chaperone was present for exam.  A:  Well Woman with normal exam  Vaginal atrophy  Candida intertrigo     P:   Labs with primary  Overdue for mammogram, she will schedule  Once her mammogram is back and normal will call in vagifem  Colonoscopy utd  Discussed breast self exam  Discussed calcium and vit D intake  Treat with nystatin ointment, then powder as needed

## 2017-10-12 ENCOUNTER — Ambulatory Visit (INDEPENDENT_AMBULATORY_CARE_PROVIDER_SITE_OTHER): Payer: Medicare Other | Admitting: Obstetrics and Gynecology

## 2017-10-12 ENCOUNTER — Encounter: Payer: Self-pay | Admitting: Obstetrics and Gynecology

## 2017-10-12 ENCOUNTER — Other Ambulatory Visit: Payer: Self-pay

## 2017-10-12 VITALS — BP 120/72 | HR 82 | Ht 65.75 in | Wt 243.8 lb

## 2017-10-12 DIAGNOSIS — N952 Postmenopausal atrophic vaginitis: Secondary | ICD-10-CM

## 2017-10-12 DIAGNOSIS — B372 Candidiasis of skin and nail: Secondary | ICD-10-CM | POA: Diagnosis not present

## 2017-10-12 DIAGNOSIS — Z01419 Encounter for gynecological examination (general) (routine) without abnormal findings: Secondary | ICD-10-CM

## 2017-10-12 MED ORDER — NYSTATIN 100000 UNIT/GM EX OINT: 1.0000 "application " | TOPICAL_OINTMENT | Freq: Two times a day (BID) | CUTANEOUS | 0 refills | Status: DC

## 2017-10-12 MED ORDER — NYSTATIN 100000 UNIT/GM EX OINT: 1.0000 "application " | TOPICAL_OINTMENT | Freq: Two times a day (BID) | CUTANEOUS | 2 refills | Status: DC

## 2017-10-12 NOTE — Patient Instructions (Signed)

## 2017-10-13 ENCOUNTER — Other Ambulatory Visit: Payer: Self-pay | Admitting: Obstetrics and Gynecology

## 2017-10-13 DIAGNOSIS — Z1231 Encounter for screening mammogram for malignant neoplasm of breast: Secondary | ICD-10-CM

## 2017-11-15 ENCOUNTER — Ambulatory Visit
Admission: RE | Admit: 2017-11-15 | Discharge: 2017-11-15 | Disposition: A | Discharge status: Home / Self Care | Payer: Medicare Other | Source: Ambulatory Visit | Attending: Obstetrics and Gynecology | Admitting: Obstetrics and Gynecology

## 2017-11-15 DIAGNOSIS — Z1231 Encounter for screening mammogram for malignant neoplasm of breast: Secondary | ICD-10-CM

## 2017-11-27 ENCOUNTER — Encounter: Payer: Self-pay | Admitting: Internal Medicine

## 2017-11-27 ENCOUNTER — Ambulatory Visit: Payer: Medicare Other | Admitting: Internal Medicine

## 2017-11-27 ENCOUNTER — Ambulatory Visit (INDEPENDENT_AMBULATORY_CARE_PROVIDER_SITE_OTHER): Payer: Medicare Other

## 2017-11-27 VITALS — BP 108/78 | HR 88 | Temp 98.3°F | Wt 254.1 lb

## 2017-11-27 DIAGNOSIS — R0789 Other chest pain: Secondary | ICD-10-CM | POA: Diagnosis not present

## 2017-11-27 DIAGNOSIS — J45909 Unspecified asthma, uncomplicated: Secondary | ICD-10-CM

## 2017-11-27 DIAGNOSIS — Z9109 Other allergy status, other than to drugs and biological substances: Secondary | ICD-10-CM | POA: Diagnosis not present

## 2017-11-27 MED ORDER — PREDNISONE 20 MG PO TABS: ORAL_TABLET | ORAL | 0 refills | Status: DC

## 2017-11-27 NOTE — Progress Notes (Signed)
Chief Complaint  Patient presents with  . Sinus Problem    x 1 week, flare. Pt does allergy shots. Pt c/o congestion, dry in sinus, hoarseness, chest discomfort/tender in right upper chest, little cough and clear mucous. Pt states that she so much mucous build up this morning that she vomited. Denies fever.     HPI: Taylor Ritter 57 y.o. come in [place]  PCP NA    Here with spouse   Onset after husband mowing  Grass     Was outside has seasonal and year round allergies on shots   And Singulair Flonase and symbicort    Sx for a week and   ro so and now   Right upper  Chest soreness no fever or hemoptysis See above.    Allergist   Dr Lucianne Lei hoppen. Weekly  Shots  Allergy sx [place]  ROS: See pertinent positives and negatives per HPI. No tobacco but grew up with parental ssmoiing and  Parent had COPD   Past Medical History:  Diagnosis Date  . Allergy    takes Allegra daily and uses nasal spray daily.Takes Benadryl as needed.Uses eye drops daily  . Anemia    B 12 shots monthly  . Anxiety    takes Xanax as needed  . Arthritis    osteoporosis  . Asthma    Proventil inhaler as needed  . Chronic back pain    DDD and stenosis  . Complication of anesthesia    pt states she was woke up during surgery  . Depression    takes Wellbutrin daily  . Fibromyalgia   . GERD (gastroesophageal reflux disease)    takes Nexium daily  . Headache(784.0)    takes Topamax daily as needed  . History of bronchitis    2016  . History of colon polyps    benign  . History of vertigo    took Meclizine several yrs ago  . Hypertension    followed by [facility]  . IBS (irritable bowel syndrome)    takes LInzess daily  . Insomnia   . Joint pain   . Muscle spasm    takes Flexeril daily as needed  . Neuromuscular disorder (HCC)    numbness arms legs,feet   . Nocturia   . Osteoporosis   . Pain    takes Keppra and Topamax daily  . Peripheral edema    takes HCTZ daily  . Sleep apnea    has a CPAP but doesn't wear often  . Urinary frequency   . Urinary incontinence   . Urinary urgency     Family History  Problem Relation Age of Onset  . Arthritis Mother   . Hypertension Mother   . Kidney disease Mother   . COPD Mother   . Heart disease Mother 32       CAD  . Diabetes Mother   . Mental illness Mother   . Arthritis Father   . Diabetes Father   . Heart disease Father   . Cancer Father        prostate cancer  . Mental illness Father        Alzheimers Dementia  . Cancer Maternal Grandmother        brain tumor  . Cancer Maternal Grandfather        lung  . Diabetes Maternal Grandfather   . Early death Brother   . Anesthesia problems Neg Hx   . Hypotension Neg  Hx   . Malignant hyperthermia Neg Hx   . Pseudochol deficiency Neg Hx     Social History   Socioeconomic History  . Marital status: Married    Spouse name: Not on file  . Number of children: Not on file  . Years of education: Not on file  . Highest education level: Not on file  Occupational History  . Not on file  Social Needs  . Financial resource strain: Not on file  . Food insecurity:    Worry: Not on file    Inability: Not on file  . Transportation needs:    Medical: Not on file    Non-medical: Not on file  Tobacco Use  . Smoking status: Never Smoker  . Smokeless tobacco: Never Used  Substance and Sexual Activity  . Alcohol use: No  . Drug use: No  . Sexual activity: Yes    Partners: Male    Birth control/protection: Surgical  Lifestyle  . Physical activity:    Days per week: Not on file    Minutes per session: Not on file  . Stress: Not on file  Relationships  . Social connections:    Talks on phone: Not on file    Gets together: Not on file    Attends religious service: Not on file    Active member of club or organization: Not on file    Attends meetings of clubs or organizations: Not on file    Relationship status: Not on file  Other  Topics Concern  . Not on file  Social History Narrative  . Not on file    Outpatient Medications Prior to Visit  Medication Sig Dispense Refill  . ALPRAZolam (XANAX) 1 MG tablet Take 1 tablet (1 mg total) by mouth 3 (three) times daily as needed for anxiety. 30 tablet 0  . azelastine (ASTELIN) 0.1 % nasal spray Place 1 spray into both nostrils 2 (two) times daily. Use in each nostril as directed    . Bepotastine Besilate (BEPREVE) 1.5 % SOLN Place 1 drop into both eyes 2 (two) times daily as needed (for dry eyes).     . budesonide-formoterol (SYMBICORT) 160-4.5 MCG/ACT inhaler Inhale 2 puffs into the lungs 2 (two) times daily.     Marland Kitchen buPROPion (WELLBUTRIN XL) 150 MG 24 hr tablet TAKE 3 TABLETS BY MOUTH  ONCE A DAY 270 tablet 1  . Calcium Carbonate-Vitamin D (CALCIUM-VITAMIN D3 PO) Take 1 tablet by mouth daily.    . cyanocobalamin (,VITAMIN B-12,) 1000 MCG/ML injection Inject 1 mL (1,000 mcg total) into the muscle every 30 (thirty) days. 10 mL 1  . diphenhydrAMINE (BENADRYL) 50 MG capsule Take 50 mg by mouth every 6 (six) hours as needed for allergies.     Marland Kitchen EPINEPHrine 0.3 mg/0.3 mL IJ SOAJ injection Inject 0.3 mg into the muscle once.     . Estradiol 10 MCG TABS vaginal tablet Place 1 tablet (10 mcg total) vaginally 2 (two) times a week. 24 tablet 3  . fexofenadine (ALLEGRA) 180 MG tablet Take 180 mg by mouth daily.      . fluticasone (FLONASE) 50 MCG/ACT nasal spray Place 2 sprays into both nostrils 2 (two) times daily.    . hydrochlorothiazide (HYDRODIURIL) 25 MG tablet TAKE 1 TABLET BY MOUTH  DAILY 90 tablet 1  . levETIRAcetam (KEPPRA) 500 MG tablet Take 1,000 mg by mouth at bedtime.     Marland Kitchen linaclotide (LINZESS) 290 MCG CAPS capsule Take 580 mcg by mouth 2 (two)  times daily.     . montelukast (SINGULAIR) 10 MG tablet Take 10 mg by mouth at bedtime.    Marland Kitchen NEXIUM 40 MG capsule TAKE 1 CAPSULE BY MOUTH TWO TIMES DAILY (Patient taking differently: Take 80 mg by mouth daily) 180 capsule 1  .  nystatin ointment (MYCOSTATIN) Apply 1 application topically 2 (two) times daily. As needed 30 g 2  . pilocarpine (SALAGEN) 5 MG tablet Take 5 mg by mouth daily as needed (for dryness).     Marland Kitchen PRESCRIPTION MEDICATION Inject 1 each into the muscle 2 (two) times a week. Allergy shot     . topiramate (TOPAMAX) 100 MG tablet Take 200 mg by mouth at bedtime.     . valACYclovir (VALTREX) 500 MG tablet TAKE 1 TABLET BY MOUTH TWO  TIMES DAILY (Patient taking differently: Take 500 mg by mouth daily. May take additional 500 mg if needed for outbreak.) 180 tablet 1  . VIIBRYD 40 MG TABS TAKE 1 TABLET BY MOUTH  DAILY 90 tablet 1   No facility-administered medications prior to visit.      EXAM:  BP 108/78 (BP Location: Right Arm, Patient Position: Sitting, Cuff Size: Large)   Pulse 88   Temp 98.3 F (36.8 C) (Oral)   Wt 254 lb 1.6 oz (115.3 kg)   SpO2 97%   BMI 41.33 kg/m   Body mass index is 41.33 kg/m. WDWN in NAD  quiet respirations; mildly congested  somewhat hoarse.  Sniffing and  Throat clearing noted    Non toxic . No stridor  HEENT: Normocephalic ;atraumatic , Eyes;  PERRL, EOMs  Full, lids and conjunctiva clear,,Ears: no deformities, canals nl, TM landmarks normal, Nose: no deformity or discharge but congested;face minimally tender Mouth : OP clear without lesion or edema . Neck: Supple without adenopathy or masses or bruits Chest:  Clear to A without wheezes rales or rhonchi CV:  S1-S2 no gallops or murmurs peripheral perfusion is normal Skin :nl perfusion and no acute rashes   PSYCH: pleasant and cooperative, no obvious depression or anxiety  BP Readings from Last 3 Encounters:  11/27/17 108/78  10/12/17 120/72  10/06/17 116/72    ASSESSMENT AND PLAN:  Discussed the following assessment and plan:  Pressure in right side of chest - Plan: DG Chest 2 View  Uncomplicated asthma, unspecified asthma severity, unspecified whether persistent  Environmental allergies - Plan: DG  Chest 2 View supsect all allergic flare Add prednisone 5 days or less.  Consideration of esophageal issues flaring some throat symptoms.  She sounds fairly hoarse and has throat clearing patient is aware Follow-up with PCP or specialist. -Patient advised to return or notify health care team  if  new concerns arise.  Patient Instructions  I agree this is probabaly allergy  resp exacerbation and you are on  Good maintenance  Therapy  pred today   Chest x ray  Today  Grossly  Ok  pred  burst today  And fyu with PCP or your allergies .  Sometimes  Esophagus  Problems can  Aggravate   Airway also .     Standley Brooking. Osiah Haring M.D.

## 2017-11-27 NOTE — Patient Instructions (Addendum)
I agree this is probabaly allergy  resp exacerbation and you are on  Good maintenance  Therapy  pred today   Chest x ray  Today  Grossly  Ok  pred  burst today  And fyu with PCP or your allergies .  Sometimes  Esophagus  Problems can  Aggravate   Airway also .

## 2017-11-28 ENCOUNTER — Encounter: Payer: Self-pay | Admitting: *Deleted

## 2018-01-04 ENCOUNTER — Other Ambulatory Visit: Payer: Self-pay | Admitting: Family Medicine

## 2018-06-07 ENCOUNTER — Other Ambulatory Visit: Payer: Self-pay | Admitting: Family Medicine

## 2018-06-07 ENCOUNTER — Other Ambulatory Visit: Payer: Self-pay | Admitting: Obstetrics and Gynecology

## 2018-06-07 NOTE — Telephone Encounter (Signed)
Medication refill request: Nystatin ointment Last AEX:  10/12/17 JJ Next AEX: 10/25/18 Last MMG (if hormonal medication request): 11/15/17 BIRADS 1 negative/density b Refill authorized: 10/12/17 #30g w/2 refills; today please advise. Order pended for #30g w/2 refills if authorized

## 2018-06-07 NOTE — Telephone Encounter (Signed)
Pt. Should have one month supply left on all requested meds; LOV with PCP 09/2017, no upcoming appointments. Routed to PCP to review.

## 2018-06-11 NOTE — Telephone Encounter (Signed)
Refill medications for 6 months.

## 2018-07-03 ENCOUNTER — Other Ambulatory Visit: Payer: Self-pay

## 2018-07-03 ENCOUNTER — Ambulatory Visit (INDEPENDENT_AMBULATORY_CARE_PROVIDER_SITE_OTHER): Payer: Medicare Other | Admitting: Family Medicine

## 2018-07-03 DIAGNOSIS — B379 Candidiasis, unspecified: Secondary | ICD-10-CM

## 2018-07-03 DIAGNOSIS — K219 Gastro-esophageal reflux disease without esophagitis: Secondary | ICD-10-CM | POA: Diagnosis not present

## 2018-07-03 DIAGNOSIS — Z0000 Encounter for general adult medical examination without abnormal findings: Secondary | ICD-10-CM | POA: Diagnosis not present

## 2018-07-03 DIAGNOSIS — E538 Deficiency of other specified B group vitamins: Secondary | ICD-10-CM | POA: Diagnosis not present

## 2018-07-03 DIAGNOSIS — F3341 Major depressive disorder, recurrent, in partial remission: Secondary | ICD-10-CM | POA: Diagnosis not present

## 2018-07-03 DIAGNOSIS — F418 Other specified anxiety disorders: Secondary | ICD-10-CM

## 2018-07-03 MED ORDER — CYANOCOBALAMIN 1000 MCG/ML IJ SOLN: 1000.0000 ug | INTRAMUSCULAR | 1 refills | Status: DC

## 2018-07-03 MED ORDER — NYSTATIN 100000 UNIT/GM EX POWD: Freq: Four times a day (QID) | CUTANEOUS | 1 refills | Status: DC

## 2018-07-03 MED ORDER — ALPRAZOLAM 1 MG PO TABS: 1.0000 mg | ORAL_TABLET | Freq: Three times a day (TID) | ORAL | 0 refills | Status: DC | PRN

## 2018-07-03 NOTE — Progress Notes (Signed)
Virtual Visit via Video Note  I connected with Taylor Ritter on 07/03/18 at  9:00 AM EDT by a video enabled telemedicine application and verified that I am speaking with the correct person using two identifiers.  Location patient: home Location provider:work or home office Persons participating in the virtual visit: patient, provider  I discussed the limitations of evaluation and management by telemedicine and the availability of in person appointments. The patient expressed understanding and agreed to proceed.   HPI: We connected with the patient for video enabled visit for Medicare wellness visit.  She last had Medicare wellness visit 2017.  We also addressed separate problem as below.  Chronic problems include history of hypertension, GERD, asthma, esophageal stricture, history of recurrent depression, obesity, B12 deficiency.  Medications reviewed.  Compliant with all.  She is requesting refills of B12.  She gets injections of 1000 mcg once monthly at home.  She also is taking alprazolam very infrequently for severe anxiety.  She states for example that sometimes when she is going to grocery store she feels extremely anxious.  We have tapered her off of regular Xanax over a year ago.  Patient is requesting nystatin powder.  She states the summer she tends to get recurrent Candida rash under both breast.  The nystatin powder has worked very well for her in the past.  1.  Risk factors based on Past Medical , Social, and Family history reviewed and as indicated above with no changes  2.  Limitations in physical activities None.  No recent falls. No formal exercise.  3.  Depression/mood PHQ-9 score of 12.  No suicidal ideation  4.  Hearing No defiits  5.  ADLs independent in all.  6.  Cognitive function (orientation to time and place, language, writing, speech,memory) no short or long term memory issues.  Language and judgement intact.  No difficulty with short-term recall  7.  Home  Safety no issues  8.  Height, weight, and visual acuity.all stable.  9.  Counseling discussed discussed stress management strategies.  10. Recommendation of preventive services.  Patient needs hepatitis C screening.  She also has not had shingles vaccine.  See written schedule of screening recommendations as below  11. Labs based on risk factors-needs follow-up B12 and basic metabolic panel when she can safely get in the office and will also include hepatitis C screen  12. Care Plan-as above  13. Other Providers-Dr. [facility]  14. Written schedule of screening/prevention services discussed with patient. -Continue with yearly flu vaccine -Needs shingles vaccine -Needs hepatitis C screening -Next tetanus due 08/10/2022 -Mammogram due 11/16/2019 -No further Pap smears indicated as she has had previous hysterectomy for benign disease -Colonoscopy due 10/07/2022  We discussed advanced directives.  She has none.  We answered any questions she had regarding advanced directives.    ROS: See pertinent positives and negatives per HPI.  Past Medical History:  Diagnosis Date  . Allergy    takes Allegra daily and uses nasal spray daily.Takes Benadryl as needed.Uses eye drops daily  . Anemia    B 12 shots monthly  . Anxiety    takes Xanax as needed  . Arthritis    osteoporosis  . Asthma    Proventil inhaler as needed  . Chronic back pain    DDD and stenosis  . Complication of anesthesia    pt states she was woke up during surgery  . Depression    takes Wellbutrin daily  . Fibromyalgia   .  GERD (gastroesophageal reflux disease)    takes Nexium daily  . Headache(784.0)    takes Topamax daily as needed  . History of bronchitis    2016  . History of colon polyps    benign  . History of vertigo    took Meclizine several yrs ago  . Hypertension    followed by [facility]  . IBS (irritable bowel syndrome)    takes LInzess daily  . Insomnia   .  Joint pain   . Muscle spasm    takes Flexeril daily as needed  . Neuromuscular disorder (HCC)    numbness arms legs,feet   . Nocturia   . Osteoporosis   . Pain    takes Keppra and Topamax daily  . Peripheral edema    takes HCTZ daily  . Sleep apnea    has a CPAP but doesn't wear often  . Urinary frequency   . Urinary incontinence   . Urinary urgency     Past Surgical History:  Procedure Laterality Date  . ABDOMINAL HYSTERECTOMY    . ANTERIOR FUSION CERVICAL SPINE    . BACK SURGERY  2012  . bladder tacked    . CARPAL TUNNEL RELEASE     R&L  . CESAREAN SECTION  1983  . CHOLECYSTECTOMY    . COLONOSCOPY    . COLONOSCOPY WITH PROPOFOL N/A 10/06/2017   Procedure: COLONOSCOPY WITH PROPOFOL;  Surgeon: Carol Ada, MD;  Location: [facility];  Service: Endoscopy;  Laterality: N/A;  . ESOPHAGEAL DILATION    . KNEE ARTHROSCOPY     L KNEE  . LUMBAR LAMINECTOMY    . LUMBAR LAMINECTOMY/DECOMPRESSION MICRODISCECTOMY  02/25/2011   Procedure: LUMBAR LAMINECTOMY/DECOMPRESSION MICRODISCECTOMY;  Surgeon: Floyce Stakes;  Location: [place] [facility];  Service: Neurosurgery;  Laterality: N/A;  Lumbar Four-Five Microdiscectomy  . LUMBAR WOUND DEBRIDEMENT  03/18/2011   Procedure: LUMBAR WOUND DEBRIDEMENT;  Surgeon: Floyce Stakes;  Location: [place] [facility];  Service: Neurosurgery;  Laterality: N/A;  Exploration of Lumbar wound  . POLYPECTOMY  10/06/2017   Procedure: POLYPECTOMY;  Surgeon: Carol Ada, MD;  Location: [facility];  Service: Endoscopy;;  . SEPTOPLASTY  10+yrs ago  . SPINAL CORD STIMULATOR INSERTION N/A 12/11/2015   Procedure: LUMBAR SPINAL CORD STIMULATOR INSERTION;  Surgeon: Clydell Hakim, MD;  Location: [place] [facility];  Service: Neurosurgery;  Laterality: N/A;    Family History  Problem Relation Age of Onset  . Arthritis Mother   . Hypertension Mother   . Kidney disease Mother   . COPD Mother   . Heart disease Mother 69       CAD  . Diabetes Mother   . Mental illness Mother    . Arthritis Father   . Diabetes Father   . Heart disease Father   . Cancer Father        prostate cancer  . Mental illness Father        Alzheimers Dementia  . Cancer Maternal Grandmother        brain tumor  . Cancer Maternal Grandfather        lung  . Diabetes Maternal Grandfather   . Early death Brother   . Anesthesia problems Neg Hx   . Hypotension Neg Hx   . Malignant hyperthermia Neg Hx   . Pseudochol deficiency Neg Hx     SOCIAL HX: Non-smoker.  No alcohol use.  Currently lives with husband and grandson   Current Outpatient Medications:  .  ALPRAZolam Duanne Moron)  1 MG tablet, Take 1 tablet (1 mg total) by mouth 3 (three) times daily as needed for anxiety., Disp: 30 tablet, Rfl: 0 .  azelastine (ASTELIN) 0.1 % nasal spray, Place 1 spray into both nostrils 2 (two) times daily. Use in each nostril as directed, Disp: , Rfl:  .  Bepotastine Besilate (BEPREVE) 1.5 % SOLN, Place 1 drop into both eyes 2 (two) times daily as needed (for dry eyes). , Disp: , Rfl:  .  budesonide-formoterol (SYMBICORT) 160-4.5 MCG/ACT inhaler, Inhale 2 puffs into the lungs 2 (two) times daily. , Disp: , Rfl:  .  buPROPion (WELLBUTRIN XL) 150 MG 24 hr tablet, TAKE 3 TABLETS BY MOUTH  ONCE A DAY, Disp: 270 tablet, Rfl: 1 .  Calcium Carbonate-Vitamin D (CALCIUM-VITAMIN D3 PO), Take 1 tablet by mouth daily., Disp: , Rfl:  .  cyanocobalamin (,VITAMIN B-12,) 1000 MCG/ML injection, Inject 1 mL (1,000 mcg total) into the muscle every 30 (thirty) days., Disp: 10 mL, Rfl: 1 .  diphenhydrAMINE (BENADRYL) 50 MG capsule, Take 50 mg by mouth every 6 (six) hours as needed for allergies. , Disp: , Rfl:  .  EPINEPHrine 0.3 mg/0.3 mL IJ SOAJ injection, Inject 0.3 mg into the muscle once. , Disp: , Rfl:  .  Estradiol 10 MCG TABS vaginal tablet, Place 1 tablet (10 mcg total) vaginally 2 (two) times a week., Disp: 24 tablet, Rfl: 3 .  fexofenadine (ALLEGRA) 180 MG tablet, Take 180 mg by mouth daily.  , Disp: , Rfl:  .   fluticasone (FLONASE) 50 MCG/ACT nasal spray, Place 2 sprays into both nostrils 2 (two) times daily., Disp: , Rfl:  .  hydrochlorothiazide (HYDRODIURIL) 25 MG tablet, TAKE 1 TABLET BY MOUTH  DAILY, Disp: 90 tablet, Rfl: 1 .  levETIRAcetam (KEPPRA) 500 MG tablet, Take 1,000 mg by mouth at bedtime. , Disp: , Rfl:  .  linaclotide (LINZESS) 290 MCG CAPS capsule, Take 580 mcg by mouth 2 (two) times daily. , Disp: , Rfl:  .  montelukast (SINGULAIR) 10 MG tablet, Take 10 mg by mouth at bedtime., Disp: , Rfl:  .  NEXIUM 40 MG capsule, TAKE 1 CAPSULE BY MOUTH TWO TIMES DAILY, Disp: 180 capsule, Rfl: 1 .  nystatin (MYCOSTATIN/NYSTOP) powder, Apply topically 4 (four) times daily., Disp: 30 g, Rfl: 1 .  nystatin ointment (MYCOSTATIN), APPLY 1 APPLICATION  TOPICALLY 2 (TWO) TIMES  DAILY AS NEEDED, Disp: 30 g, Rfl: 0 .  pilocarpine (SALAGEN) 5 MG tablet, Take 5 mg by mouth daily as needed (for dryness). , Disp: , Rfl:  .  predniSONE (DELTASONE) 20 MG tablet, Take 3 po qd for 2 days then 2 po qd for 3 days,or as directed, Disp: 12 tablet, Rfl: 0 .  PRESCRIPTION MEDICATION, Inject 1 each into the muscle 2 (two) times a week. Allergy shot , Disp: , Rfl:  .  topiramate (TOPAMAX) 100 MG tablet, Take 200 mg by mouth at bedtime. , Disp: , Rfl:  .  valACYclovir (VALTREX) 500 MG tablet, TAKE 500 MG BY MOUTH DAILY. MAY TAKE ADDITIONAL 500 MG  IF NEEDED FOR OUTBREAK., Disp: 180 tablet, Rfl: 1 .  VIIBRYD 40 MG TABS, TAKE 1 TABLET BY MOUTH  DAILY, Disp: 90 tablet, Rfl: 1  EXAM:  VITALS per patient if applicable:  GENERAL: alert, oriented, appears well and in no acute distress  HEENT: atraumatic, conjunttiva clear, no obvious abnormalities on inspection of external nose and ears  NECK: normal movements of the head and neck  LUNGS:  on inspection no signs of respiratory distress, breathing rate appears normal, no obvious gross SOB, gasping or wheezing  CV: no obvious cyanosis  MS: moves all visible extremities  without noticeable abnormality  PSYCH/NEURO: pleasant and cooperative, no obvious depression or anxiety, speech and thought processing grossly intact  ASSESSMENT AND PLAN:  Discussed the following assessment and plan:  #1 Medicare wellness visit.  We addressed the following health maintenance issues -Needs hepatitis C screen -Needs shingles vaccine -Discussed advanced directives and she will consider looking into this  #2 history of B12 deficiency -Refilled B12 which she is giving with home injections  #3 history of recurrent depression. -Continue Viibryd and Wellbutrin -Discussed nonpharmacologic management of depression  #4 situational anxiety -Limit benzodiazepine use.  We agreed to 1 refill of Xanax to use only for severe anxiety episodes  #5 GERD with history of esophageal stricture stable on PPI     I discussed the assessment and treatment plan with the patient. The patient was provided an opportunity to ask questions and all were answered. The patient agreed with the plan and demonstrated an understanding of the instructions.   The patient was advised to call back or seek an in-person evaluation if the symptoms worsen or if the condition fails to improve as anticipated.   Carolann Littler, MD

## 2018-07-09 ENCOUNTER — Telehealth: Payer: Medicare Other | Admitting: Physician Assistant

## 2018-07-09 DIAGNOSIS — J019 Acute sinusitis, unspecified: Secondary | ICD-10-CM | POA: Diagnosis not present

## 2018-07-09 MED ORDER — AMOXICILLIN-POT CLAVULANATE 875-125 MG PO TABS: 1.0000 | ORAL_TABLET | Freq: Two times a day (BID) | ORAL | 0 refills | Status: DC

## 2018-07-09 NOTE — Progress Notes (Signed)
We are sorry that you are not feeling well.  Here is how we plan to help!  Based on what you have shared with me it looks like you have sinusitis.  Sinusitis is inflammation and infection in the sinus cavities of the head.  Based on your presentation I believe you most likely have Acute Bacterial Sinusitis.  This is an infection caused by bacteria and is treated with antibiotics. I have prescribed Augmentin 875mg /125mg  one tablet twice daily with food, for 7 days.   Also continue with your Allegra and Flonase 2 sprays in each nostril once daily, as advised.   You may use an oral decongestant such as Mucinex D or if you have glaucoma or high blood pressure use plain Mucinex. Saline nasal spray help and can safely be used as often as needed for congestion.  If you develop worsening sinus pain, fever or notice severe headache and vision changes, or if symptoms are not better after completion of antibiotic, please schedule an appointment with a health care provider.    Sinus infections are not as easily transmitted as other respiratory infection, however we still recommend that you avoid close contact with loved ones, especially the very young and elderly.  Remember to wash your hands thoroughly throughout the day as this is the number one way to prevent the spread of infection!  Home Care:  Only take medications as instructed by your medical team.  Complete the entire course of an antibiotic.  Do not take these medications with alcohol.  A steam or ultrasonic humidifier can help congestion.  You can place a towel over your head and breathe in the steam from hot water coming from a faucet.  Avoid close contacts especially the very young and the elderly.  Cover your mouth when you cough or sneeze.  Always remember to wash your hands.  Get Help Right Away If:  You develop worsening fever or sinus pain.  You develop a severe head ache or visual changes.  Your symptoms persist after you have  completed your treatment plan.  Make sure you  Understand these instructions.  Will watch your condition.  Will get help right away if you are not doing well or get worse.  Your e-visit answers were reviewed by a board certified advanced clinical practitioner to complete your personal care plan.  Depending on the condition, your plan could have included both over the counter or prescription medications.  If there is a problem please reply  once you have received a response from your provider.  Your safety is important to [place].  If you have drug allergies check your prescription carefully.    You can use MyChart to ask questions about today's visit, request a non-urgent call back, or ask for a work or school excuse for 24 hours related to this e-Visit. If it has been greater than 24 hours you will need to follow up with your provider, or enter a new e-Visit to address those concerns.  You will get an e-mail in the next two days asking about your experience.  I hope that your e-visit has been valuable and will speed your recovery. Thank you for using e-visits.  I have spent 7 min in completion and review of this note- Lacy Duverney [facility]

## 2018-10-25 ENCOUNTER — Ambulatory Visit: Payer: Medicare Other | Admitting: Obstetrics and Gynecology

## 2018-12-12 ENCOUNTER — Other Ambulatory Visit: Payer: Self-pay | Admitting: Family Medicine

## 2018-12-21 ENCOUNTER — Other Ambulatory Visit: Payer: Self-pay

## 2018-12-24 ENCOUNTER — Other Ambulatory Visit: Payer: Self-pay

## 2018-12-24 ENCOUNTER — Ambulatory Visit: Payer: Medicare Other | Admitting: Obstetrics and Gynecology

## 2018-12-24 ENCOUNTER — Encounter: Payer: Self-pay | Admitting: Obstetrics and Gynecology

## 2018-12-24 VITALS — BP 150/80 | HR 72 | Temp 97.1°F | Ht 65.75 in | Wt 269.0 lb

## 2018-12-24 DIAGNOSIS — I10 Essential (primary) hypertension: Secondary | ICD-10-CM

## 2018-12-24 DIAGNOSIS — E559 Vitamin D deficiency, unspecified: Secondary | ICD-10-CM

## 2018-12-24 DIAGNOSIS — R109 Unspecified abdominal pain: Secondary | ICD-10-CM

## 2018-12-24 DIAGNOSIS — R3129 Other microscopic hematuria: Secondary | ICD-10-CM

## 2018-12-24 DIAGNOSIS — Z8659 Personal history of other mental and behavioral disorders: Secondary | ICD-10-CM

## 2018-12-24 DIAGNOSIS — Z01419 Encounter for gynecological examination (general) (routine) without abnormal findings: Secondary | ICD-10-CM

## 2018-12-24 DIAGNOSIS — J4521 Mild intermittent asthma with (acute) exacerbation: Secondary | ICD-10-CM

## 2018-12-24 DIAGNOSIS — B372 Candidiasis of skin and nail: Secondary | ICD-10-CM

## 2018-12-24 DIAGNOSIS — R10A0 Flank pain, unspecified side: Secondary | ICD-10-CM

## 2018-12-24 DIAGNOSIS — E538 Deficiency of other specified B group vitamins: Secondary | ICD-10-CM

## 2018-12-24 DIAGNOSIS — Z0000 Encounter for general adult medical examination without abnormal findings: Secondary | ICD-10-CM

## 2018-12-24 LAB — POCT URINALYSIS DIPSTICK
Bilirubin, UA: NEGATIVE
Blood, UA: POSITIVE
Glucose, UA: NEGATIVE
Ketones, UA: NEGATIVE
Leukocytes, UA: NEGATIVE
Nitrite, UA: NEGATIVE
Protein, UA: NEGATIVE
Spec Grav, UA: 1.01 (ref 1.010–1.025)
Urobilinogen, UA: 0.2 E.U./dL
pH, UA: 5 (ref 5.0–8.0)

## 2018-12-24 MED ORDER — NYSTATIN 100000 UNIT/GM EX OINT: 1.0000 "application " | TOPICAL_OINTMENT | Freq: Two times a day (BID) | CUTANEOUS | 0 refills | Status: DC

## 2018-12-24 NOTE — Progress Notes (Signed)
58 y.o. G30P1001 Married White or Caucasian Not Hispanic or Latino female here for annual exam.  H/O hysterectomy. She c/o new pain in her left shoulder and axillary region. Bruising easily.  Husband recovering from shoulder surgery. She has been taking care of him.  No vaginal bleeding. Not sexually active. She has vaginal estrogen, hasn't been using it regularly.   H/O OAB, better recently.  Bowel goes from loose to constipated.   H/O chronic pain. She will see a Chiropractor and pain doctor.   She has gained weight with covid.  H/O recurrent candida under her breasts.     No LMP recorded. Patient has had a hysterectomy.          Sexually active: No.  The current method of family planning is status post hysterectomy.    Exercising: Yes.    walking Smoker:  no  Health Maintenance: Pap:08-03-11 WNL History of abnormal Pap:no MMG:11/15/2017 Birads 1 negative Colonoscopy:10/06/2017 polyp, f/u in 3 years.  [id number] TDaP:08-09-12 Gardasil:N/A   reports that she has never smoked. She has never used smokeless tobacco. She reports that she does not drink alcohol or use drugs. 1 daughter, 2 grandsons 3 step grandchildren. Raised 4 grandchildren. On disability for pain  Past Medical History:  Diagnosis Date  . Allergy    takes Allegra daily and uses nasal spray daily.Takes Benadryl as needed.Uses eye drops daily  . Anemia    B 12 shots monthly  . Anxiety    takes Xanax as needed  . Arthritis    osteoporosis  . Asthma    Proventil inhaler as needed  . Chronic back pain    DDD and stenosis  . Complication of anesthesia    pt states she was woke up during surgery  . Depression    takes Wellbutrin daily  . Fibromyalgia   . GERD (gastroesophageal reflux disease)    takes Nexium daily  . Headache(784.0)    takes Topamax daily as needed  . History of bronchitis    2016  . History of colon polyps    benign  . History of vertigo    took Meclizine several yrs ago  .  Hypertension    followed by [facility]  . IBS (irritable bowel syndrome)    takes LInzess daily  . Insomnia   . Joint pain   . Muscle spasm    takes Flexeril daily as needed  . Neuromuscular disorder (HCC)    numbness arms legs,feet   . Nocturia   . Osteoporosis   . Pain    takes Keppra and Topamax daily  . Peripheral edema    takes HCTZ daily  . Sleep apnea    has a CPAP but doesn't wear often  . Urinary frequency   . Urinary incontinence   . Urinary urgency     Past Surgical History:  Procedure Laterality Date  . ABDOMINAL HYSTERECTOMY    . ANTERIOR FUSION CERVICAL SPINE    . BACK SURGERY  2012  . bladder tacked    . CARPAL TUNNEL RELEASE     R&L  . CESAREAN SECTION  1983  . CHOLECYSTECTOMY    . COLONOSCOPY    . COLONOSCOPY WITH PROPOFOL N/A 10/06/2017   Procedure: COLONOSCOPY WITH PROPOFOL;  Surgeon: Carol Ada, MD;  Location: [facility];  Service: Endoscopy;  Laterality: N/A;  . ESOPHAGEAL DILATION    . KNEE ARTHROSCOPY     L KNEE  . LUMBAR LAMINECTOMY    .  LUMBAR LAMINECTOMY/DECOMPRESSION MICRODISCECTOMY  02/25/2011   Procedure: LUMBAR LAMINECTOMY/DECOMPRESSION MICRODISCECTOMY;  Surgeon: Floyce Stakes;  Location: [place] [facility];  Service: Neurosurgery;  Laterality: N/A;  Lumbar Four-Five Microdiscectomy  . LUMBAR WOUND DEBRIDEMENT  03/18/2011   Procedure: LUMBAR WOUND DEBRIDEMENT;  Surgeon: Floyce Stakes;  Location: [place] [facility];  Service: Neurosurgery;  Laterality: N/A;  Exploration of Lumbar wound  . POLYPECTOMY  10/06/2017   Procedure: POLYPECTOMY;  Surgeon: Carol Ada, MD;  Location: [facility];  Service: Endoscopy;;  . SEPTOPLASTY  10+yrs ago  . SPINAL CORD STIMULATOR INSERTION N/A 12/11/2015   Procedure: LUMBAR SPINAL CORD STIMULATOR INSERTION;  Surgeon: Clydell Hakim, MD;  Location: [place] [facility];  Service: Neurosurgery;  Laterality: N/A;    Current Outpatient Medications  Medication Sig Dispense Refill  . ALPRAZolam  (XANAX) 1 MG tablet Take 1 tablet (1 mg total) by mouth 3 (three) times daily as needed for anxiety. 30 tablet 0  . azelastine (ASTELIN) 0.1 % nasal spray Place 1 spray into both nostrils 2 (two) times daily. Use in each nostril as directed    . budesonide-formoterol (SYMBICORT) 160-4.5 MCG/ACT inhaler Inhale 2 puffs into the lungs 2 (two) times daily.     Marland Kitchen buPROPion (WELLBUTRIN XL) 150 MG 24 hr tablet TAKE 3 TABLETS BY MOUTH  ONCE DAILY 270 tablet 0  . cyanocobalamin (,VITAMIN B-12,) 1000 MCG/ML injection Inject 1 mL (1,000 mcg total) into the muscle every 30 (thirty) days. 10 mL 1  . diphenhydrAMINE (BENADRYL) 50 MG capsule Take 50 mg by mouth every 6 (six) hours as needed for allergies.     Marland Kitchen EPINEPHrine 0.3 mg/0.3 mL IJ SOAJ injection Inject 0.3 mg into the muscle once.     . Estradiol 10 MCG TABS vaginal tablet Place 1 tablet (10 mcg total) vaginally 2 (two) times a week. 24 tablet 3  . fexofenadine (ALLEGRA) 180 MG tablet Take 180 mg by mouth daily.      . fluticasone (FLONASE) 50 MCG/ACT nasal spray Place 2 sprays into both nostrils 2 (two) times daily.    . hydrochlorothiazide (HYDRODIURIL) 25 MG tablet TAKE 1 TABLET BY MOUTH  DAILY 90 tablet 0  . levETIRAcetam (KEPPRA) 500 MG tablet Take 1,000 mg by mouth at bedtime.     Marland Kitchen linaclotide (LINZESS) 290 MCG CAPS capsule Take 580 mcg by mouth 2 (two) times daily.     . montelukast (SINGULAIR) 10 MG tablet Take 10 mg by mouth at bedtime.    Marland Kitchen NEXIUM 40 MG capsule TAKE 1 CAPSULE BY MOUTH  TWICE DAILY 180 capsule 0  . nystatin (MYCOSTATIN/NYSTOP) powder Apply topically 4 (four) times daily. 30 g 1  . nystatin ointment (MYCOSTATIN) APPLY 1 APPLICATION  TOPICALLY 2 (TWO) TIMES  DAILY AS NEEDED 30 g 0  . topiramate (TOPAMAX) 100 MG tablet Take 200 mg by mouth at bedtime.     . valACYclovir (VALTREX) 500 MG tablet TAKE 1 TABLET BY MOUTH  DAILY. MAY TAKE ADDITIONAL  1 TABLET IF NEEDED FOR  OUTBREAK. 180 tablet 0  . VIIBRYD 40 MG TABS TAKE 1 TABLET BY  MOUTH  DAILY 90 tablet 0  . Calcium Carbonate-Vitamin D (CALCIUM-VITAMIN D3 PO) Take 1 tablet by mouth daily.    Marland Kitchen PRESCRIPTION MEDICATION Inject 1 each into the muscle 2 (two) times a week. Allergy shot      No current facility-administered medications for this visit.     Family History  Problem Relation Age of Onset  . Arthritis Mother   .  Hypertension Mother   . Kidney disease Mother   . COPD Mother   . Heart disease Mother 79       CAD  . Diabetes Mother   . Mental illness Mother   . Arthritis Father   . Diabetes Father   . Heart disease Father   . Cancer Father        prostate cancer  . Mental illness Father        Alzheimers Dementia  . Cancer Maternal Grandmother        brain tumor  . Cancer Maternal Grandfather        lung  . Diabetes Maternal Grandfather   . Early death Brother   . Anesthesia problems Neg Hx   . Hypotension Neg Hx   . Malignant hyperthermia Neg Hx   . Pseudochol deficiency Neg Hx     Review of Systems  Constitutional:       Weight gain  HENT: Negative.   Eyes: Negative.   Respiratory: Negative.   Cardiovascular: Negative.   Gastrointestinal: Negative.   Endocrine: Negative.   Genitourinary: Negative.   Musculoskeletal: Positive for neck pain.       Pain in left arm  Skin: Negative.   Allergic/Immunologic: Negative.   Neurological: Negative.   Hematological: Negative.   Psychiatric/Behavioral: Negative.   one month ago she noticed blood with wiping after voiding, has left flank pain.   Exam:   BP (!) 150/80 (BP Location: Right Arm, Patient Position: Sitting, Cuff Size: Large)   Pulse 72   Temp (!) 97.1 F (36.2 C) (Skin)   Ht 5' 5.75" (1.67 m)   Wt 269 lb (122 kg)   BMI 43.75 kg/m   Weight change: @WEIGHTCHANGE @ Height:   Height: 5' 5.75" (167 cm)  Ht Readings from Last 3 Encounters:  12/24/18 5' 5.75" (1.67 m)  10/12/17 5' 5.75" (1.67 m)  10/06/17 5\' 6"  (1.676 m)    General appearance: alert, cooperative and appears  stated age Head: Normocephalic, without obvious abnormality, atraumatic Neck: no adenopathy, supple, symmetrical, trachea midline and thyroid normal to inspection and palpation Lungs: clear to auscultation bilaterally Cardiovascular: regular rate and rhythm Breasts: normal appearance, no masses or tenderness Abdomen: soft, non-tender; non distended,  no masses,  no organomegaly Extremities: extremities normal, atraumatic, no cyanosis or edema Skin: Skin color, texture, turgor normal. Erythematous patchy rash under bilateral breasts (mild) and bilateral groin.  Lymph nodes: Cervical, supraclavicular, and axillary nodes normal. No abnormal inguinal nodes palpated Neurologic: Grossly normal   Pelvic: External genitalia:  no lesions              Urethra:  normal appearing urethra with no masses, tenderness or lesions              Bartholins and Skenes: normal                 Vagina: atrophic appearing vagina with normal color and discharge, no lesions              Cervix: absent               Bimanual Exam:  Uterus:  uterus absent              Adnexa: no mass, fullness, tenderness               Rectovaginal: Confirms               Anus:  normal sphincter tone, no lesions  Chaperone  was present for exam.  A:  Well Woman with normal exam  Back pain (below her flank)  Microscopic hematuria  Candida intertrigo  BMI 109  H/o vit d and vit B12 der  P:   No pap needed  Mammogram due  Colonoscopy UTD  Check urine dip, if + for blood will send  Discussed breast self exam  Discussed calcium and vit D intake  Nystatin refill sent  Screening labs, TSH, HgbA1C  Vit D, vit b12  Medical weight loss clinic information given

## 2018-12-24 NOTE — Patient Instructions (Signed)

## 2018-12-25 LAB — VITAMIN D 25 HYDROXY (VIT D DEFICIENCY, FRACTURES): Vit D, 25-Hydroxy: 17 ng/mL — ABNORMAL LOW (ref 30.0–100.0)

## 2018-12-25 LAB — COMPREHENSIVE METABOLIC PANEL
ALT: 31 IU/L (ref 0–32)
AST: 40 IU/L (ref 0–40)
Albumin/Globulin Ratio: 1.6 (ref 1.2–2.2)
Albumin: 4.2 g/dL (ref 3.8–4.9)
Alkaline Phosphatase: 103 IU/L (ref 39–117)
BUN/Creatinine Ratio: 12 (ref 9–23)
BUN: 14 mg/dL (ref 6–24)
Bilirubin Total: 0.3 mg/dL (ref 0.0–1.2)
CO2: 22 mmol/L (ref 20–29)
Calcium: 8.9 mg/dL (ref 8.7–10.2)
Chloride: 107 mmol/L — ABNORMAL HIGH (ref 96–106)
Creatinine, Ser: 1.13 mg/dL — ABNORMAL HIGH (ref 0.57–1.00)
GFR calc Af Amer: 61 mL/min/{1.73_m2} (ref >59)
GFR calc non Af Amer: 53 mL/min/{1.73_m2} — ABNORMAL LOW (ref >59)
Globulin, Total: 2.7 g/dL (ref 1.5–4.5)
Glucose: 96 mg/dL (ref 65–99)
Potassium: 3.4 mmol/L — ABNORMAL LOW (ref 3.5–5.2)
Sodium: 143 mmol/L (ref 134–144)
Total Protein: 6.9 g/dL (ref 6.0–8.5)

## 2018-12-25 LAB — URINALYSIS, MICROSCOPIC ONLY: Casts: NONE SEEN /lpf

## 2018-12-25 LAB — LIPID PANEL
Chol/HDL Ratio: 4.2 ratio (ref 0.0–4.4)
Cholesterol, Total: 189 mg/dL (ref 100–199)
HDL: 45 mg/dL (ref >39)
LDL Chol Calc (NIH): 111 mg/dL — ABNORMAL HIGH (ref 0–99)
Triglycerides: 191 mg/dL — ABNORMAL HIGH (ref 0–149)
VLDL Cholesterol Cal: 33 mg/dL (ref 5–40)

## 2018-12-25 LAB — CBC
Hematocrit: 38 % (ref 34.0–46.6)
Hemoglobin: 12.4 g/dL (ref 11.1–15.9)
MCH: 30.3 pg (ref 26.6–33.0)
MCHC: 32.6 g/dL (ref 31.5–35.7)
MCV: 93 fL (ref 79–97)
Platelets: 258 10*3/uL (ref 150–450)
RBC: 4.09 x10E6/uL (ref 3.77–5.28)
RDW: 12.4 % (ref 11.7–15.4)
WBC: 6.8 10*3/uL (ref 3.4–10.8)

## 2018-12-25 LAB — TSH: TSH: 3.67 u[IU]/mL (ref 0.450–4.500)

## 2018-12-25 LAB — VITAMIN B12: Vitamin B-12: 665 pg/mL (ref 232–1245)

## 2018-12-25 LAB — HEMOGLOBIN A1C
Est. average glucose Bld gHb Est-mCnc: 120 mg/dL
Hgb A1c MFr Bld: 5.8 % — ABNORMAL HIGH (ref 4.8–5.6)

## 2018-12-26 LAB — URINE CULTURE

## 2019-02-13 ENCOUNTER — Other Ambulatory Visit: Payer: Self-pay | Admitting: Obstetrics and Gynecology

## 2019-02-13 DIAGNOSIS — Z01419 Encounter for gynecological examination (general) (routine) without abnormal findings: Secondary | ICD-10-CM

## 2019-02-13 NOTE — Telephone Encounter (Signed)
Med refill request: Nystatin Last AEX:12/24/2018 Next AEX: 12/25/2019 Last MMG (if hormonal med) 11/15/2017 BIRADS 1, Negative Refill authorized: 30 grams 0RF, orders pended if approved.  Will route to Dr Talbert Nan

## 2019-02-27 ENCOUNTER — Other Ambulatory Visit: Payer: Self-pay | Admitting: Family Medicine

## 2019-02-27 ENCOUNTER — Other Ambulatory Visit: Payer: Self-pay

## 2019-02-27 MED ORDER — MONTELUKAST SODIUM 10 MG PO TABS: 10.0000 mg | ORAL_TABLET | Freq: Every day | ORAL | 3 refills | Status: DC

## 2019-03-19 ENCOUNTER — Telehealth: Payer: Self-pay | Admitting: *Deleted

## 2019-03-19 NOTE — Telephone Encounter (Signed)
Copied from [place] [id number]. Topic: General - Inquiry >> Mar 19, 2019  3:35 PM Percell Belt A wrote: Reason for CRM: pt called in and stated that the 2 meds below will need a PA done on them  NEXIUM 40 MG capsule [id number] VIIBRYD 40 MG TABS [id number]

## 2019-03-19 NOTE — Telephone Encounter (Signed)
I don't think we prescribed the Viibryd - but she is on both meds and they will need to be authorized.  May need to confirm with her whether she is seeing psychiatry (I could not recall).

## 2019-03-19 NOTE — Telephone Encounter (Signed)
OK to proceed with PA for both of these medications?

## 2019-03-20 ENCOUNTER — Telehealth: Payer: Self-pay | Admitting: *Deleted

## 2019-03-20 ENCOUNTER — Encounter: Payer: Self-pay | Admitting: Family Medicine

## 2019-03-20 NOTE — Telephone Encounter (Signed)
Sent both medications to Cover My Meds. Neither would go through so I called and spoke to [facility] and she is faxing forms for both medications. Jinny Blossom thinks there was a different address on file for previous prior authorizations.  Viibryd 40 mg  Key # [id number] Nexium 40 mg Key # [id number]  Placed both forms in red folder on Dr. Anastasio Auerbach desk.  Please return when complete. Thanks!

## 2019-03-20 NOTE — Telephone Encounter (Signed)
See prior note in which patient stated the Rx needs a prior auth-Rx was sent to the pharmacy previously.

## 2019-03-20 NOTE — Telephone Encounter (Signed)
I called [facility] pharmacy and received Rx info to submit the PA under BIN#[id number] and ID [id number].  Prior auths were submitted to [email or web address] for both  Viibryd-[id number] and Esomeprazole-[id number] and a note was received stating these do not need a prior auth and are covered.  I called the pt and informed her of this and advised she contact the pharmacy and she stated her husband dropped off info with new insurance for her and it does need a prior auth.  Message sent back to Dr Burchette's asst.

## 2019-03-20 NOTE — Telephone Encounter (Signed)
Go ahead and refill for 6 months.  

## 2019-03-20 NOTE — Telephone Encounter (Signed)
Left a message for the pt to return a call to the office.  CRM also created. 

## 2019-03-20 NOTE — Telephone Encounter (Signed)
Correction:  Base Plan ([id number]) [id number] Card issued on 02/23/2019

## 2019-03-20 NOTE — Telephone Encounter (Signed)
Patient returned call to office and stated that she was seeing Dr. Sabra Heck, who prescribed the Vibryd, but he does not practice with [facility] anymore.

## 2019-03-20 NOTE — Telephone Encounter (Signed)
Called patient and she gave me her new insurance information and is going to send a Pharmacist, community message with the pictures attached.  [employer] PPO ID: [id number] Group #: [id number] Rx BIN: [id number] Rx PCN: [id number] Base plan: [id number] Another number: [id number]  Sending a new prior authorization for both medications.

## 2019-03-21 NOTE — Telephone Encounter (Signed)
done

## 2019-03-29 ENCOUNTER — Ambulatory Visit: Payer: Self-pay | Admitting: *Deleted

## 2019-03-29 NOTE — Telephone Encounter (Signed)
Message Routed to PCP CMA 

## 2019-03-29 NOTE — Telephone Encounter (Signed)
Patient has an appointment on 04/01/19.

## 2019-03-29 NOTE — Telephone Encounter (Signed)
Pt called in c/o being swimmy headed or light headed for the last week and a half.     See triage notes.  I went over the care advice and when to go to the ED.   She verbalized understanding.  I warm transferred her call into Dr. Erick Blinks office to [facility] to be scheduled.  I sent my notes to the office.  Reason for Disposition . [1] MODERATE dizziness (e.g., interferes with normal activities) AND [2] has NOT been evaluated by physician for this  (Exception: dizziness caused by heat exposure, sudden standing, or poor fluid intake)  Answer Assessment - Initial Assessment Questions 1. DESCRIPTION: "Describe your dizziness."     I'm swimmy headed especially if I bend over.    I've had vertigo before.   This feels different.    I'm hurting in my left arm also.    I have a history of anemia.   I take B12 shots.    2. LIGHTHEADED: "Do you feel lightheaded?" (e.g., somewhat faint, woozy, weak upon standing)     I'm tired all time.   I feel swimmy headed a lot of the time. 3. VERTIGO: "Do you feel like either you or the room is spinning or tilting?" (i.e. vertigo)     No    This feels differently. 4. SEVERITY: "How bad is it?"  "Do you feel like you are going to faint?" "Can you stand and walk?"   - MILD - walking normally   - MODERATE - interferes with normal activities (e.g., work, school)    - SEVERE - unable to stand, requires support to walk, feels like passing out now.      I'm fine with ambulating until the dizziness hits and I have to hold onto something.     My daughter is a Marine scientist.    5. ONSET:  "When did the dizziness begin?"     A week and 1/2 ago. 6. AGGRAVATING FACTORS: "Does anything make it worse?" (e.g., standing, change in head position)     Turning my head makes it worse.    I've had a lot of surgery on my back with bolts and screws.   7. HEART RATE: "Can you tell me your heart rate?" "How many beats in 15 seconds?"  (Note: not all patients can do this)       Not asked. 8.  CAUSE: "What do you think is causing the dizziness?"     Maybe my sinuses or anemia. 9. RECURRENT SYMPTOM: "Have you had dizziness before?" If so, ask: "When was the last time?" "What happened that time?"     I've had vertigo a long time ago.    10. OTHER SYMPTOMS: "Do you have any other symptoms?" (e.g., fever, chest pain, vomiting, diarrhea, bleeding)       None of the above 11. PREGNANCY: "Is there any chance you are pregnant?" "When was your last menstrual period?"       N/A due to age  Protocols used: DIZZINESS [facility]

## 2019-04-01 ENCOUNTER — Ambulatory Visit: Payer: Medicare Other | Admitting: Family Medicine

## 2019-04-03 ENCOUNTER — Other Ambulatory Visit: Payer: Self-pay

## 2019-04-03 ENCOUNTER — Ambulatory Visit: Payer: Medicare PPO | Admitting: Family Medicine

## 2019-04-03 ENCOUNTER — Encounter: Payer: Self-pay | Admitting: Family Medicine

## 2019-04-03 VITALS — BP 120/68 | HR 94 | Temp 98.1°F | Ht 65.75 in | Wt 272.5 lb

## 2019-04-03 DIAGNOSIS — K219 Gastro-esophageal reflux disease without esophagitis: Secondary | ICD-10-CM | POA: Diagnosis not present

## 2019-04-03 DIAGNOSIS — F419 Anxiety disorder, unspecified: Secondary | ICD-10-CM

## 2019-04-03 DIAGNOSIS — E876 Hypokalemia: Secondary | ICD-10-CM

## 2019-04-03 DIAGNOSIS — H8112 Benign paroxysmal vertigo, left ear: Secondary | ICD-10-CM | POA: Diagnosis not present

## 2019-04-03 DIAGNOSIS — K222 Esophageal obstruction: Secondary | ICD-10-CM

## 2019-04-03 MED ORDER — POTASSIUM CHLORIDE ER 10 MEQ PO TBCR: 10.0000 meq | EXTENDED_RELEASE_TABLET | Freq: Every day | ORAL | 3 refills | Status: DC

## 2019-04-03 MED ORDER — NYSTATIN 100000 UNIT/GM EX POWD: 1.0000 "application " | Freq: Three times a day (TID) | CUTANEOUS | 5 refills | Status: DC

## 2019-04-03 MED ORDER — ALPRAZOLAM 1 MG PO TABS: 1.0000 mg | ORAL_TABLET | Freq: Three times a day (TID) | ORAL | 0 refills | Status: DC | PRN

## 2019-04-03 NOTE — Patient Instructions (Signed)

## 2019-04-03 NOTE — Progress Notes (Signed)
Subjective:     Patient ID: Taylor Ritter, female   DOB: 01-11-1961, 59 y.o.   MRN: [id number]  HPI  Patient is seen for follow-up regarding several items.  First is that she has longstanding history of GERD.  She has been on Nexium for several years and seen GI and has had complications of esophageal stricture which has been dilated previously.  No current dysphagia.  She is on namebrand Nexium and states that when she has tried reducing this to once daily or has gone to generics or 2 alternatives such as H2 blocker she has not had good control.  She had consistent breakthrough symptoms.  She requests refills of nystatin powder.  She has used this for fungal infections in the past  She takes HCTZ for hypertension.  She is on K-Lor and recently ran out needs refills.  She had recent electrolytes through GYN and had potassium 3.4.  She was not taking her K-Lor consistently at that time  Intermittent vertigo symptoms.  She has consistent vertigo symptoms to the left.  These are triggered by head movement.  Does not have any speech changes or any focal weakness or ataxia.  Past Medical History:  Diagnosis Date  . Allergy    takes Allegra daily and uses nasal spray daily.Takes Benadryl as needed.Uses eye drops daily  . Anemia    B 12 shots monthly  . Anxiety    takes Xanax as needed  . Arthritis    osteoporosis  . Asthma    Proventil inhaler as needed  . Chronic back pain    DDD and stenosis  . Complication of anesthesia    pt states she was woke up during surgery  . Depression    takes Wellbutrin daily  . Fibromyalgia   . GERD (gastroesophageal reflux disease)    takes Nexium daily  . Headache(784.0)    takes Topamax daily as needed  . History of bronchitis    2016  . History of colon polyps    benign  . History of vertigo    took Meclizine several yrs ago  . Hypertension    followed by [facility]  . IBS (irritable bowel syndrome)    takes  LInzess daily  . Insomnia   . Joint pain   . Muscle spasm    takes Flexeril daily as needed  . Neuromuscular disorder (HCC)    numbness arms legs,feet   . Nocturia   . Osteoporosis   . Pain    takes Keppra and Topamax daily  . Peripheral edema    takes HCTZ daily  . Sleep apnea    has a CPAP but doesn't wear often  . Urinary frequency   . Urinary incontinence   . Urinary urgency    Past Surgical History:  Procedure Laterality Date  . ABDOMINAL HYSTERECTOMY    . ANTERIOR FUSION CERVICAL SPINE    . BACK SURGERY  2012  . bladder tacked    . CARPAL TUNNEL RELEASE     R&L  . CESAREAN SECTION  1983  . CHOLECYSTECTOMY    . COLONOSCOPY    . COLONOSCOPY WITH PROPOFOL N/A 10/06/2017   Procedure: COLONOSCOPY WITH PROPOFOL;  Surgeon: Carol Ada, MD;  Location: [facility];  Service: Endoscopy;  Laterality: N/A;  . ESOPHAGEAL DILATION    . KNEE ARTHROSCOPY     L KNEE  . LUMBAR LAMINECTOMY    . LUMBAR LAMINECTOMY/DECOMPRESSION MICRODISCECTOMY  02/25/2011   Procedure: LUMBAR LAMINECTOMY/DECOMPRESSION  MICRODISCECTOMY;  Surgeon: Floyce Stakes;  Location: [place] [facility];  Service: Neurosurgery;  Laterality: N/A;  Lumbar Four-Five Microdiscectomy  . LUMBAR WOUND DEBRIDEMENT  03/18/2011   Procedure: LUMBAR WOUND DEBRIDEMENT;  Surgeon: Floyce Stakes;  Location: [place] [facility];  Service: Neurosurgery;  Laterality: N/A;  Exploration of Lumbar wound  . POLYPECTOMY  10/06/2017   Procedure: POLYPECTOMY;  Surgeon: Carol Ada, MD;  Location: [facility];  Service: Endoscopy;;  . SEPTOPLASTY  10+yrs ago  . SPINAL CORD STIMULATOR INSERTION N/A 12/11/2015   Procedure: LUMBAR SPINAL CORD STIMULATOR INSERTION;  Surgeon: Clydell Hakim, MD;  Location: [place] [facility];  Service: Neurosurgery;  Laterality: N/A;    reports that she has never smoked. She has never used smokeless tobacco. She reports that she does not drink alcohol or use drugs. family history includes Arthritis in her father and mother;  COPD in her mother; Cancer in her father, maternal grandfather, and maternal grandmother; Diabetes in her father, maternal grandfather, and mother; Early death in her brother; Heart disease in her father; Heart disease (age of onset: 39) in her mother; Hypertension in her mother; Kidney disease in her mother; Mental illness in her father and mother. Allergies  Allergen Reactions  . Other Other (See Comments)    Nylon sutures had to remove to allow healing  Vicryl sutures - do not use  . Percocet [Oxycodone-Acetaminophen] Other (See Comments)    "makes her crazy"  . Codeine Hives  . Dye Fdc Red [Red Dye] Hives and Other (See Comments)    Scan dye, broke out in hives all over once with a neck scan  . Morphine And Related Hives and Itching  . Vesicare [Solifenacin Succinate] Itching     Review of Systems  Constitutional: Negative for appetite change, chills, fever and unexpected weight change.  HENT: Negative for trouble swallowing.   Respiratory: Negative for cough and shortness of breath.   Cardiovascular: Negative for chest pain.  Genitourinary: Negative for dysuria.  Neurological: Positive for dizziness. Negative for seizures, syncope, speech difficulty, weakness and headaches.       Objective:   Physical Exam Vitals reviewed.  Constitutional:      Appearance: Normal appearance.  HENT:     Right Ear: Tympanic membrane normal.     Left Ear: Tympanic membrane normal.  Eyes:     Pupils: Pupils are equal, round, and reactive to light.  Cardiovascular:     Rate and Rhythm: Normal rate and regular rhythm.  Pulmonary:     Effort: Pulmonary effort is normal.     Breath sounds: Normal breath sounds.  Musculoskeletal:     Right lower leg: No edema.     Left lower leg: No edema.  Neurological:     General: No focal deficit present.     Mental Status: She is alert.     Cranial Nerves: No cranial nerve deficit.     Motor: No weakness.     Coordination: Coordination normal.      Comments: No nystagmus at this time        Assessment:     #1 longstanding history of GERD currently stable on regimen of Nexium nongeneric twice daily.  Breakthrough episodes as above when she has tried either decreasing this to once daily or has gone to generic medications  #2 intermittent vertigo to the left.  Suspect benign peripheral positional vertigo  #3 intermittent anxiety  #4 history of hypokalemia and chronic diuretic use    Plan:     -  Refill K-Lor for 1 year -We will consider basic metabolic panel at 54-month follow-up -Refill nystatin powder -Refilled alprazolam once but use only sparingly for severe anxiety and not daily or regularly -Discussed Epley maneuvers with handout given and we instructed her on these.  Touch base if these or not resolving her vertigo issues -Prior authorization forms completed for her GERD  Eulas Post MD [facility]

## 2019-04-05 NOTE — Telephone Encounter (Signed)
Approval for prior authorization for Nexium 40 mg received. Effective dates: 03/22/2019--03/20/2020 Reference number: [id number]

## 2019-04-10 ENCOUNTER — Encounter: Payer: Self-pay | Admitting: Family Medicine

## 2019-04-17 ENCOUNTER — Other Ambulatory Visit: Payer: Self-pay | Admitting: Family Medicine

## 2019-04-24 ENCOUNTER — Telehealth: Payer: Self-pay

## 2019-04-24 NOTE — Telephone Encounter (Signed)
Sent prior authorization on [employer] form to Cover My Meds Key # [facility]

## 2019-06-10 ENCOUNTER — Telehealth: Payer: Self-pay | Admitting: Family Medicine

## 2019-06-10 ENCOUNTER — Telehealth: Payer: Self-pay | Admitting: Obstetrics and Gynecology

## 2019-06-10 DIAGNOSIS — Z01419 Encounter for gynecological examination (general) (routine) without abnormal findings: Secondary | ICD-10-CM

## 2019-06-10 NOTE — Telephone Encounter (Signed)
Spoke to [place] at [facility]. Pharmacy verified.   Left message for pt to call back to triage RN for Rx refill request for Nystatin.

## 2019-06-10 NOTE — Telephone Encounter (Signed)
[employer] calling regarding prescription for nystatin powder. Phone number is [phone]

## 2019-06-10 NOTE — Telephone Encounter (Signed)
Authorization has for nexuim was already approved until 03/20/20

## 2019-06-10 NOTE — Telephone Encounter (Signed)
Spoke back with pt. Pt requesting nystatin ointment to use under breast PRN. Pt states has changed insurances to [facility] and they are requesting meds to be refilled. Pt states not out yet, but will be soon. Pt states using twice a day as needed.   Routing to Dr Talbert Nan for review and refill pended if approved.

## 2019-06-10 NOTE — Telephone Encounter (Signed)
Katharine Look from [place] called to check on PA that was faxed over for two medications.

## 2019-06-10 NOTE — Telephone Encounter (Signed)
Viibryd has already been aprroved until12/31/21

## 2019-06-10 NOTE — Telephone Encounter (Signed)
These have been received working on them

## 2019-06-11 MED ORDER — NYSTATIN 100000 UNIT/GM EX OINT: 1.0000 "application " | TOPICAL_OINTMENT | Freq: Two times a day (BID) | CUTANEOUS | 0 refills | Status: DC

## 2019-06-12 ENCOUNTER — Other Ambulatory Visit: Payer: Self-pay

## 2019-06-12 ENCOUNTER — Encounter: Payer: Self-pay | Admitting: Family Medicine

## 2019-06-12 MED ORDER — BUPROPION HCL ER (XL) 150 MG PO TB24: 450.0000 mg | ORAL_TABLET | Freq: Every day | ORAL | 0 refills | Status: DC

## 2019-06-12 MED ORDER — VALACYCLOVIR HCL 500 MG PO TABS: ORAL_TABLET | ORAL | 0 refills | Status: DC

## 2019-06-12 MED ORDER — NEXIUM 40 MG PO CPDR: 40.0000 mg | DELAYED_RELEASE_CAPSULE | Freq: Two times a day (BID) | ORAL | 0 refills | Status: DC

## 2019-06-12 MED ORDER — HYDROCHLOROTHIAZIDE 25 MG PO TABS: 25.0000 mg | ORAL_TABLET | Freq: Every day | ORAL | 0 refills | Status: DC

## 2019-06-12 MED ORDER — CYANOCOBALAMIN 1000 MCG/ML IJ SOLN: 1000.0000 ug | INTRAMUSCULAR | 1 refills | Status: DC

## 2019-06-12 MED ORDER — MONTELUKAST SODIUM 10 MG PO TABS: 10.0000 mg | ORAL_TABLET | Freq: Every day | ORAL | 3 refills | Status: DC

## 2019-06-14 ENCOUNTER — Other Ambulatory Visit: Payer: Self-pay | Admitting: Family Medicine

## 2019-06-14 MED ORDER — VIIBRYD 40 MG PO TABS: 40.0000 mg | ORAL_TABLET | Freq: Every day | ORAL | 0 refills | Status: DC

## 2019-06-27 DIAGNOSIS — J4530 Mild persistent asthma, uncomplicated: Secondary | ICD-10-CM | POA: Diagnosis not present

## 2019-06-27 DIAGNOSIS — J3089 Other allergic rhinitis: Secondary | ICD-10-CM | POA: Diagnosis not present

## 2019-06-27 DIAGNOSIS — J301 Allergic rhinitis due to pollen: Secondary | ICD-10-CM | POA: Diagnosis not present

## 2019-07-15 DIAGNOSIS — J301 Allergic rhinitis due to pollen: Secondary | ICD-10-CM | POA: Diagnosis not present

## 2019-07-15 DIAGNOSIS — J3089 Other allergic rhinitis: Secondary | ICD-10-CM | POA: Diagnosis not present

## 2019-07-16 DIAGNOSIS — J3089 Other allergic rhinitis: Secondary | ICD-10-CM | POA: Diagnosis not present

## 2019-07-16 DIAGNOSIS — J301 Allergic rhinitis due to pollen: Secondary | ICD-10-CM | POA: Diagnosis not present

## 2019-07-25 DIAGNOSIS — J301 Allergic rhinitis due to pollen: Secondary | ICD-10-CM | POA: Diagnosis not present

## 2019-07-25 DIAGNOSIS — J3089 Other allergic rhinitis: Secondary | ICD-10-CM | POA: Diagnosis not present

## 2019-08-01 DIAGNOSIS — J301 Allergic rhinitis due to pollen: Secondary | ICD-10-CM | POA: Diagnosis not present

## 2019-08-01 DIAGNOSIS — J3089 Other allergic rhinitis: Secondary | ICD-10-CM | POA: Diagnosis not present

## 2019-08-08 DIAGNOSIS — J3089 Other allergic rhinitis: Secondary | ICD-10-CM | POA: Diagnosis not present

## 2019-08-08 DIAGNOSIS — J301 Allergic rhinitis due to pollen: Secondary | ICD-10-CM | POA: Diagnosis not present

## 2019-08-15 DIAGNOSIS — J301 Allergic rhinitis due to pollen: Secondary | ICD-10-CM | POA: Diagnosis not present

## 2019-08-15 DIAGNOSIS — J3089 Other allergic rhinitis: Secondary | ICD-10-CM | POA: Diagnosis not present

## 2019-08-22 DIAGNOSIS — J3089 Other allergic rhinitis: Secondary | ICD-10-CM | POA: Diagnosis not present

## 2019-08-22 DIAGNOSIS — J301 Allergic rhinitis due to pollen: Secondary | ICD-10-CM | POA: Diagnosis not present

## 2019-08-27 ENCOUNTER — Ambulatory Visit: Payer: Medicare PPO | Admitting: Family Medicine

## 2019-08-29 DIAGNOSIS — J3089 Other allergic rhinitis: Secondary | ICD-10-CM | POA: Diagnosis not present

## 2019-08-29 DIAGNOSIS — J301 Allergic rhinitis due to pollen: Secondary | ICD-10-CM | POA: Diagnosis not present

## 2019-09-05 DIAGNOSIS — J301 Allergic rhinitis due to pollen: Secondary | ICD-10-CM | POA: Diagnosis not present

## 2019-09-05 DIAGNOSIS — J3089 Other allergic rhinitis: Secondary | ICD-10-CM | POA: Diagnosis not present

## 2019-09-10 ENCOUNTER — Other Ambulatory Visit: Payer: Self-pay

## 2019-09-10 ENCOUNTER — Emergency Department (HOSPITAL_BASED_OUTPATIENT_CLINIC_OR_DEPARTMENT_OTHER)
Admission: EM | Admit: 2019-09-10 | Discharge: 2019-09-10 | Disposition: A | Discharge status: Home / Self Care | Payer: Medicare PPO | Attending: Emergency Medicine | Admitting: Emergency Medicine

## 2019-09-10 ENCOUNTER — Emergency Department (HOSPITAL_BASED_OUTPATIENT_CLINIC_OR_DEPARTMENT_OTHER): Payer: Medicare PPO

## 2019-09-10 ENCOUNTER — Encounter (HOSPITAL_BASED_OUTPATIENT_CLINIC_OR_DEPARTMENT_OTHER): Payer: Self-pay

## 2019-09-10 DIAGNOSIS — I10 Essential (primary) hypertension: Secondary | ICD-10-CM | POA: Insufficient documentation

## 2019-09-10 DIAGNOSIS — M546 Pain in thoracic spine: Secondary | ICD-10-CM | POA: Insufficient documentation

## 2019-09-10 DIAGNOSIS — Z79899 Other long term (current) drug therapy: Secondary | ICD-10-CM | POA: Insufficient documentation

## 2019-09-10 DIAGNOSIS — R079 Chest pain, unspecified: Secondary | ICD-10-CM | POA: Insufficient documentation

## 2019-09-10 DIAGNOSIS — J45909 Unspecified asthma, uncomplicated: Secondary | ICD-10-CM | POA: Insufficient documentation

## 2019-09-10 DIAGNOSIS — R0789 Other chest pain: Secondary | ICD-10-CM | POA: Diagnosis not present

## 2019-09-10 LAB — CBC
HCT: 41.5 % (ref 36.0–46.0)
Hemoglobin: 13.3 g/dL (ref 12.0–15.0)
MCH: 30.9 pg (ref 26.0–34.0)
MCHC: 32 g/dL (ref 30.0–36.0)
MCV: 96.3 fL (ref 80.0–100.0)
Platelets: 265 10*3/uL (ref 150–400)
RBC: 4.31 MIL/uL (ref 3.87–5.11)
RDW: 12.9 % (ref 11.5–15.5)
WBC: 9.4 10*3/uL (ref 4.0–10.5)
nRBC: 0 % (ref 0.0–0.2)

## 2019-09-10 LAB — BASIC METABOLIC PANEL
Anion gap: 13 (ref 5–15)
BUN: 12 mg/dL (ref 6–20)
CO2: 24 mmol/L (ref 22–32)
Calcium: 9 mg/dL (ref 8.9–10.3)
Chloride: 103 mmol/L (ref 98–111)
Creatinine, Ser: 1.33 mg/dL — ABNORMAL HIGH (ref 0.44–1.00)
GFR calc Af Amer: 51 mL/min — ABNORMAL LOW (ref >60)
GFR calc non Af Amer: 44 mL/min — ABNORMAL LOW (ref >60)
Glucose, Bld: 122 mg/dL — ABNORMAL HIGH (ref 70–99)
Potassium: 3.6 mmol/L (ref 3.5–5.1)
Sodium: 140 mmol/L (ref 135–145)

## 2019-09-10 LAB — TROPONIN I (HIGH SENSITIVITY)
Troponin I (High Sensitivity): 3 ng/L (ref <18)
Troponin I (High Sensitivity): 5 ng/L (ref <18)

## 2019-09-10 MED ORDER — SODIUM CHLORIDE 0.9% FLUSH
3.0000 mL | Freq: Once | INTRAVENOUS | Status: DC
  Filled 2019-09-10: qty 3

## 2019-09-10 MED ORDER — CYCLOBENZAPRINE HCL 10 MG PO TABS
10.0000 mg | ORAL_TABLET | Freq: Two times a day (BID) | ORAL | 0 refills | Status: DC | PRN
Start: 2019-09-10 — End: 2020-03-27

## 2019-09-10 NOTE — ED Triage Notes (Addendum)
C/o intermittent CP x 1-2 weeks-NAD-steady gait-denies fever/flu sx

## 2019-09-12 ENCOUNTER — Other Ambulatory Visit: Payer: Self-pay | Admitting: Family Medicine

## 2019-09-12 DIAGNOSIS — J3089 Other allergic rhinitis: Secondary | ICD-10-CM | POA: Diagnosis not present

## 2019-09-12 DIAGNOSIS — J301 Allergic rhinitis due to pollen: Secondary | ICD-10-CM | POA: Diagnosis not present

## 2019-09-13 NOTE — ED Provider Notes (Signed)
[facility] EMERGENCY DEPARTMENT Provider Note   CSN: [id number] Arrival date & time: 09/10/19  1442     History Chief Complaint  Patient presents with  . Chest Pain    Taylor Ritter is a 59 y.o. female.  HPI   59 year old female with chest pain.  Intermittent.  Onset about a week to week and a half ago.  Pain is to her right lateral chest into her thoracic back.  Sometimes worse with movement.  No respiratory complaints.  No fevers or chills.  No nausea or diaphoresis.  Denies any acute trauma or strain.  No unusual leg pain or swelling.  Past Medical History:  Diagnosis Date  . Allergy    takes Allegra daily and uses nasal spray daily.Takes Benadryl as needed.Uses eye drops daily  . Anemia    B 12 shots monthly  . Anxiety    takes Xanax as needed  . Arthritis    osteoporosis  . Asthma    Proventil inhaler as needed  . Chronic back pain    DDD and stenosis  . Complication of anesthesia    pt states she was woke up during surgery  . Depression    takes Wellbutrin daily  . Fibromyalgia   . GERD (gastroesophageal reflux disease)    takes Nexium daily  . Headache(784.0)    takes Topamax daily as needed  . History of bronchitis    2016  . History of colon polyps    benign  . History of vertigo    took Meclizine several yrs ago  . Hypertension    followed by [facility]  . IBS (irritable bowel syndrome)    takes LInzess daily  . Insomnia   . Joint pain   . Muscle spasm    takes Flexeril daily as needed  . Neuromuscular disorder (HCC)    numbness arms legs,feet   . Nocturia   . Osteoporosis   . Pain    takes Keppra and Topamax daily  . Peripheral edema    takes HCTZ daily  . Sleep apnea    has a CPAP but doesn't wear often  . Urinary frequency   . Urinary incontinence   . Urinary urgency     Patient Active Problem List   Diagnosis Date Noted  . Recurrent cold sores 10/03/2014  . Vitamin B 12 deficiency 10/03/2014    . Severe obesity (BMI >= 40) ([place]) 08/25/2013  . Asthma 02/28/2012  . Osteoarthritis cervical spine 02/28/2012  . Depression 02/28/2012  . Hypertension 02/28/2012  . Recurrent UTI 02/28/2012  . GERD (gastroesophageal reflux disease) 02/28/2012  . Esophageal stricture 02/28/2012    Past Surgical History:  Procedure Laterality Date  . ABDOMINAL HYSTERECTOMY    . ANTERIOR FUSION CERVICAL SPINE    . BACK SURGERY  2012  . bladder tacked    . CARPAL TUNNEL RELEASE     R&L  . CESAREAN SECTION  1983  . CHOLECYSTECTOMY    . COLONOSCOPY    . COLONOSCOPY WITH PROPOFOL N/A 10/06/2017   Procedure: COLONOSCOPY WITH PROPOFOL;  Surgeon: Carol Ada, MD;  Location: [facility];  Service: Endoscopy;  Laterality: N/A;  . ESOPHAGEAL DILATION    . KNEE ARTHROSCOPY     L KNEE  . LUMBAR LAMINECTOMY    . LUMBAR LAMINECTOMY/DECOMPRESSION MICRODISCECTOMY  02/25/2011   Procedure: LUMBAR LAMINECTOMY/DECOMPRESSION MICRODISCECTOMY;  Surgeon: Floyce Stakes;  Location: [place] [facility];  Service: Neurosurgery;  Laterality: N/A;  Lumbar Four-Five  Microdiscectomy  . LUMBAR WOUND DEBRIDEMENT  03/18/2011   Procedure: LUMBAR WOUND DEBRIDEMENT;  Surgeon: Floyce Stakes;  Location: [place] [facility];  Service: Neurosurgery;  Laterality: N/A;  Exploration of Lumbar wound  . POLYPECTOMY  10/06/2017   Procedure: POLYPECTOMY;  Surgeon: Carol Ada, MD;  Location: [facility];  Service: Endoscopy;;  . SEPTOPLASTY  10+yrs ago  . SPINAL CORD STIMULATOR INSERTION N/A 12/11/2015   Procedure: LUMBAR SPINAL CORD STIMULATOR INSERTION;  Surgeon: Clydell Hakim, MD;  Location: [place] [facility];  Service: Neurosurgery;  Laterality: N/A;     OB History    Gravida  1   Para  1   Term  1   Preterm      AB      Living  1     SAB      TAB      Ectopic      Multiple      Live Births  1           Family History  Problem Relation Age of Onset  . Arthritis Mother   . Hypertension Mother   . Kidney disease Mother    . COPD Mother   . Heart disease Mother 38       CAD  . Diabetes Mother   . Mental illness Mother   . Arthritis Father   . Diabetes Father   . Heart disease Father   . Cancer Father        prostate cancer  . Mental illness Father        Alzheimers Dementia  . Cancer Maternal Grandmother        brain tumor  . Cancer Maternal Grandfather        lung  . Diabetes Maternal Grandfather   . Early death Brother   . Anesthesia problems Neg Hx   . Hypotension Neg Hx   . Malignant hyperthermia Neg Hx   . Pseudochol deficiency Neg Hx     Social History   Tobacco Use  . Smoking status: Never Smoker  . Smokeless tobacco: Never Used  Vaping Use  . Vaping Use: Never used  Substance Use Topics  . Alcohol use: No  . Drug use: No    Home Medications Prior to Admission medications   Medication Sig Start Date End Date Taking? Authorizing Provider  ALPRAZolam Duanne Moron) 1 MG tablet Take 1 tablet (1 mg total) by mouth 3 (three) times daily as needed for anxiety. 04/03/19   Burchette, Alinda Sierras, MD  azelastine (ASTELIN) 0.1 % nasal spray Place 1 spray into both nostrils 2 (two) times daily. Use in each nostril as directed    [provider]  budesonide-formoterol (SYMBICORT) 160-4.5 MCG/ACT inhaler Inhale 2 puffs into the lungs 2 (two) times daily.     [provider]  buPROPion (WELLBUTRIN XL) 150 MG 24 hr tablet TAKE 3 TABLETS (450 MG TOTAL) BY MOUTH DAILY.  NEED APPT AND LABWORK FOR MORE REFILLS 09/13/19   Burchette, Alinda Sierras, MD  Calcium Carbonate-Vitamin D (CALCIUM-VITAMIN D3 PO) Take 1 tablet by mouth daily.    [provider]  cyanocobalamin (,VITAMIN B-12,) 1000 MCG/ML injection Inject 1 mL (1,000 mcg total) into the muscle every 30 (thirty) days. 06/12/19   Burchette, Alinda Sierras, MD  cyclobenzaprine (FLEXERIL) 10 MG tablet Take 1 tablet (10 mg total) by mouth 2 (two) times daily as needed for muscle spasms. 09/10/19   Virgel Manifold, MD  diphenhydrAMINE (BENADRYL) 50 MG  capsule Take 50  mg by mouth daily.     [provider]  EPINEPHrine 0.3 mg/0.3 mL IJ SOAJ injection Inject 0.3 mg into the muscle once.     [provider]  Estradiol 10 MCG TABS vaginal tablet Place 1 tablet (10 mcg total) vaginally 2 (two) times a week. 01/11/16   Salvadore Dom, MD  fexofenadine (ALLEGRA) 180 MG tablet Take 180 mg by mouth daily.      [provider]  fluticasone (FLONASE) 50 MCG/ACT nasal spray Place 2 sprays into both nostrils 2 (two) times daily.    [provider]  hydrochlorothiazide (HYDRODIURIL) 25 MG tablet TAKE 1 TABLET EVERY DAY. NEED PHYSICAL WITH LABS FOR REFILLS [phone] 09/13/19   Burchette, Alinda Sierras, MD  levETIRAcetam (KEPPRA) 500 MG tablet Take 1,000 mg by mouth at bedtime.     [provider]  linaclotide (LINZESS) 290 MCG CAPS capsule Take 580 mcg by mouth 2 (two) times daily.     [provider]  montelukast (SINGULAIR) 10 MG tablet Take 1 tablet (10 mg total) by mouth at bedtime. 06/12/19   Burchette, Alinda Sierras, MD  NEXIUM 40 MG capsule Take 1 capsule (40 mg total) by mouth 2 (two) times daily. 06/12/19   Burchette, Alinda Sierras, MD  nystatin (MYCOSTATIN/NYSTOP) powder Apply topically 4 (four) times daily. 07/03/18   Burchette, Alinda Sierras, MD  nystatin (MYCOSTATIN/NYSTOP) powder Apply 1 application topically 3 (three) times daily. 04/03/19   Burchette, Alinda Sierras, MD  nystatin ointment (MYCOSTATIN) Apply 1 application topically 2 (two) times daily. As needed 06/11/19   Salvadore Dom, MD  potassium chloride (KLOR-CON 10) 10 MEQ tablet Take 1 tablet (10 mEq total) by mouth daily. 04/03/19   Burchette, Alinda Sierras, MD  PRESCRIPTION MEDICATION Inject 1 each into the muscle 2 (two) times a week. Allergy shot     [provider]  topiramate (TOPAMAX) 100 MG tablet Take 200 mg by mouth at bedtime.     [provider]  valACYclovir (VALTREX) 500 MG tablet TAKE 1 TABLET BY MOUTH  DAILY MAY TAKE ADDITIONAL 1  TABLET BY MOUTH IF NEEDED  FOR OUTBREAK 06/12/19   Burchette, Alinda Sierras, MD  VIIBRYD 40 MG TABS TAKE 1 TABLET (40 MG TOTAL) BY MOUTH DAILY. 09/13/19   Burchette, Alinda Sierras, MD    Allergies    Other, Percocet [oxycodone-acetaminophen], Codeine, Dye fdc red [red dye], Morphine and related, and Vesicare [solifenacin succinate]  Review of Systems   Review of Systems All systems reviewed and negative, other than as noted in HPI.  Physical Exam Updated Vital Signs BP (!) 142/93 (BP Location: Right Arm)   Pulse (!) 101   Temp 99.3 F (37.4 C) (Oral)   Resp 15   Ht 5\' 6"  (1.676 m)   Wt 127 kg   SpO2 97%   BMI 45.19 kg/m   Physical Exam Vitals and nursing note reviewed.  Constitutional:      General: She is not in acute distress.    Appearance: She is well-developed.  HENT:     Head: Normocephalic and atraumatic.  Eyes:     General:        Right eye: No discharge.        Left eye: No discharge.     Conjunctiva/sclera: Conjunctivae normal.  Cardiovascular:     Rate and Rhythm: Normal rate and regular rhythm.     Heart sounds: Normal heart sounds. No murmur heard.  No friction rub. No gallop.  Pulmonary:     Effort: Pulmonary effort is normal. No respiratory distress.     Breath sounds: Normal breath sounds.  Abdominal:     General: There is no distension.     Palpations: Abdomen is soft.     Tenderness: There is no abdominal tenderness.  Musculoskeletal:        General: No tenderness.     Cervical back: Neck supple.     Comments: Lower extremities symmetric as compared to each other. No calf tenderness. Negative Homan's. No palpable cords.   Skin:    General: Skin is warm and dry.  Neurological:     Mental Status: She is alert.  Psychiatric:        Behavior: Behavior normal.        Thought Content: Thought content normal.     ED Results / Procedures / Treatments   Labs (all labs ordered are listed, but only abnormal results are displayed) Labs Reviewed  BASIC  METABOLIC PANEL - Abnormal; Notable for the following components:      Result Value   Glucose, Bld 122 (*)    Creatinine, Ser 1.33 (*)    GFR calc non Af Amer 44 (*)    GFR calc Af Amer 51 (*)    All other components within normal limits  CBC  TROPONIN I (HIGH SENSITIVITY)  TROPONIN I (HIGH SENSITIVITY)    EKG EKG Interpretation  Date/Time:  Tuesday September 10 2019 14:49:30 EDT Ventricular Rate:  100 PR Interval:  156 QRS Duration: 76 QT Interval:  342 QTC Calculation: 441 R Axis:   15 Text Interpretation: Normal sinus rhythm Nonspecific ST abnormality Abnormal ECG Confirmed by Virgel Manifold [id number]) on 09/10/2019 3:30:12 PM   Radiology No results found.  Procedures Procedures (including critical care time)  Medications Ordered in ED Medications - No data to display  ED Course  I have reviewed the triage vital signs and the nursing notes.  Pertinent labs & imaging results that were available during my care of the patient were reviewed by me and considered in my medical decision making (see chart for details).    MDM Rules/Calculators/A&P                          59 year old female with chest and thoracic back pain.  Suspect this is musculoskeletal.  Doubt ACS, PE, dissection or other emergent process.  Plan symptomatic treatment.  Return precautions discussed.  Outpatient follow-up otherwise. Final Clinical Impression(s) / ED Diagnoses Final diagnoses:  Chest pain, unspecified type  Acute right-sided thoracic back pain    Rx / DC Orders ED Discharge Orders         Ordered    cyclobenzaprine (FLEXERIL) 10 MG tablet  2 times daily PRN     Discontinue  Reprint     09/10/19 1941           Virgel Manifold, MD 09/13/19 2257

## 2019-09-16 ENCOUNTER — Other Ambulatory Visit: Payer: Self-pay | Admitting: Family Medicine

## 2019-09-17 ENCOUNTER — Ambulatory Visit: Payer: Medicare PPO | Admitting: Family Medicine

## 2019-09-17 ENCOUNTER — Other Ambulatory Visit: Payer: Self-pay

## 2019-09-17 ENCOUNTER — Encounter: Payer: Self-pay | Admitting: Family Medicine

## 2019-09-17 VITALS — BP 134/76 | HR 101 | Temp 97.9°F | Wt 280.2 lb

## 2019-09-17 DIAGNOSIS — R0789 Other chest pain: Secondary | ICD-10-CM | POA: Diagnosis not present

## 2019-09-17 DIAGNOSIS — M546 Pain in thoracic spine: Secondary | ICD-10-CM | POA: Diagnosis not present

## 2019-09-17 DIAGNOSIS — R0600 Dyspnea, unspecified: Secondary | ICD-10-CM | POA: Diagnosis not present

## 2019-09-17 LAB — D-DIMER, QUANTITATIVE: D-Dimer, Quant: 0.32 mcg/mL FEU (ref <0.50)

## 2019-09-17 NOTE — Progress Notes (Signed)
Established Patient Office Visit  Subjective:  Patient ID: Taylor Ritter, female    DOB: Aug 13, 1960  Age: 58 y.o. MRN: [id number]  CC:  Chief Complaint  Patient presents with  . Follow-up    Pt went to hospital in [place] for back pain, chest pain and shortmess of breathe     HPI Taylor Ritter presents for emergency room follow-up.  She went to emergency room over in [place] 1 week ago today with some atypical chest pain.  This is right-sided with radiation to the back.  Not exertional.  Worse with movement.  No cough or fever.  She had chest x-ray which showed no acute findings.  EKG showed nonspecific ST T changes but no worrisome findings.  Troponins were negative.  CBC normal.  Creatinine 1.33.  Electrolytes stable.  Was felt she had musculoskeletal pain.  She was prescribed Flexeril but this did not make any difference.  She has taken ibuprofen up to 800 mg without any change.  No increased leg edema or leg pain.  No hemoptysis.  Does have mild pleuritic component with pain in her back with deep breath.  She has never had any history of DVT or pulmonary embolus but she states her mom had DVT and father had pulmonary embolus history.  Taylor Ritter does not take any hormones.  Non-smoker.  No recent prolonged travels.  She does have chronic back pain and has back stimulator in place right lower lumbar area.  She thought this may be related to her stimulator she turns off a week ago but is not seeing improvement.  She has follow-up with her back specialist tomorrow  Labs and x-rays and EKG from ER reviewed  Past Medical History:  Diagnosis Date  . Allergy    takes Allegra daily and uses nasal spray daily.Takes Benadryl as needed.Uses eye drops daily  . Anemia    B 12 shots monthly  . Anxiety    takes Xanax as needed  . Arthritis    osteoporosis  . Asthma    Proventil inhaler as needed  . Chronic back pain    DDD and stenosis  . Complication of anesthesia    pt states  she was woke up during surgery  . Depression    takes Wellbutrin daily  . Fibromyalgia   . GERD (gastroesophageal reflux disease)    takes Nexium daily  . Headache(784.0)    takes Topamax daily as needed  . History of bronchitis    2016  . History of colon polyps    benign  . History of vertigo    took Meclizine several yrs ago  . Hypertension    followed by [facility]  . IBS (irritable bowel syndrome)    takes LInzess daily  . Insomnia   . Joint pain   . Muscle spasm    takes Flexeril daily as needed  . Neuromuscular disorder (HCC)    numbness arms legs,feet   . Nocturia   . Osteoporosis   . Pain    takes Keppra and Topamax daily  . Peripheral edema    takes HCTZ daily  . Sleep apnea    has a CPAP but doesn't wear often  . Urinary frequency   . Urinary incontinence   . Urinary urgency     Past Surgical History:  Procedure Laterality Date  . ABDOMINAL HYSTERECTOMY    . ANTERIOR FUSION CERVICAL SPINE    . BACK SURGERY  2012  . bladder  tacked    . CARPAL TUNNEL RELEASE     R&L  . CESAREAN SECTION  1983  . CHOLECYSTECTOMY    . COLONOSCOPY    . COLONOSCOPY WITH PROPOFOL N/A 10/06/2017   Procedure: COLONOSCOPY WITH PROPOFOL;  Surgeon: Carol Ada, MD;  Location: [facility];  Service: Endoscopy;  Laterality: N/A;  . ESOPHAGEAL DILATION    . KNEE ARTHROSCOPY     L KNEE  . LUMBAR LAMINECTOMY    . LUMBAR LAMINECTOMY/DECOMPRESSION MICRODISCECTOMY  02/25/2011   Procedure: LUMBAR LAMINECTOMY/DECOMPRESSION MICRODISCECTOMY;  Surgeon: Floyce Stakes;  Location: [place] [facility];  Service: Neurosurgery;  Laterality: N/A;  Lumbar Four-Five Microdiscectomy  . LUMBAR WOUND DEBRIDEMENT  03/18/2011   Procedure: LUMBAR WOUND DEBRIDEMENT;  Surgeon: Floyce Stakes;  Location: [place] [facility];  Service: Neurosurgery;  Laterality: N/A;  Exploration of Lumbar wound  . POLYPECTOMY  10/06/2017   Procedure: POLYPECTOMY;  Surgeon: Carol Ada, MD;  Location: [facility]  ENDOSCOPY;  Service: Endoscopy;;  . SEPTOPLASTY  10+yrs ago  . SPINAL CORD STIMULATOR INSERTION N/A 12/11/2015   Procedure: LUMBAR SPINAL CORD STIMULATOR INSERTION;  Surgeon: Clydell Hakim, MD;  Location: [place] [facility];  Service: Neurosurgery;  Laterality: N/A;    Family History  Problem Relation Age of Onset  . Arthritis Mother   . Hypertension Mother   . Kidney disease Mother   . COPD Mother   . Heart disease Mother 88       CAD  . Diabetes Mother   . Mental illness Mother   . Arthritis Father   . Diabetes Father   . Heart disease Father   . Cancer Father        prostate cancer  . Mental illness Father        Alzheimers Dementia  . Cancer Maternal Grandmother        brain tumor  . Cancer Maternal Grandfather        lung  . Diabetes Maternal Grandfather   . Early death Brother   . Anesthesia problems Neg Hx   . Hypotension Neg Hx   . Malignant hyperthermia Neg Hx   . Pseudochol deficiency Neg Hx     Social History   Socioeconomic History  . Marital status: Married    Spouse name: Not on file  . Number of children: Not on file  . Years of education: Not on file  . Highest education level: Not on file  Occupational History  . Not on file  Tobacco Use  . Smoking status: Never Smoker  . Smokeless tobacco: Never Used  Vaping Use  . Vaping Use: Never used  Substance and Sexual Activity  . Alcohol use: No  . Drug use: No  . Sexual activity: Not on file  Other Topics Concern  . Not on file  Social History Narrative  . Not on file   Social Determinants of Health   Financial Resource Strain:   . Difficulty of Paying Living Expenses:   Food Insecurity:   . Worried About Charity fundraiser in the Last Year:   . Arboriculturist in the Last Year:   Transportation Needs:   . Film/video editor (Medical):   Marland Kitchen Lack of Transportation (Non-Medical):   Physical Activity:   . Days of Exercise per Week:   . Minutes of Exercise per Session:   Stress:   . Feeling  of Stress :   Social Connections:   . Frequency of Communication with Friends and Family:   .  Frequency of Social Gatherings with Friends and Family:   . Attends Religious Services:   . Active Member of Clubs or Organizations:   . Attends Archivist Meetings:   Marland Kitchen Marital Status:   Intimate Partner Violence:   . Fear of Current or Ex-Partner:   . Emotionally Abused:   Marland Kitchen Physically Abused:   . Sexually Abused:     Outpatient Medications Prior to Visit  Medication Sig Dispense Refill  . ALPRAZolam (XANAX) 1 MG tablet Take 1 tablet (1 mg total) by mouth 3 (three) times daily as needed for anxiety. 30 tablet 0  . azelastine (ASTELIN) 0.1 % nasal spray Place 1 spray into both nostrils 2 (two) times daily. Use in each nostril as directed    . budesonide-formoterol (SYMBICORT) 160-4.5 MCG/ACT inhaler Inhale 2 puffs into the lungs 2 (two) times daily.     Marland Kitchen buPROPion (WELLBUTRIN XL) 150 MG 24 hr tablet TAKE 3 TABLETS (450 MG TOTAL) BY MOUTH DAILY.  NEED APPT AND LABWORK FOR MORE REFILLS 270 tablet 0  . Calcium Carbonate-Vitamin D (CALCIUM-VITAMIN D3 PO) Take 1 tablet by mouth daily.    . cyanocobalamin (,VITAMIN B-12,) 1000 MCG/ML injection Inject 1 mL (1,000 mcg total) into the muscle every 30 (thirty) days. 10 mL 1  . cyclobenzaprine (FLEXERIL) 10 MG tablet Take 1 tablet (10 mg total) by mouth 2 (two) times daily as needed for muscle spasms. 20 tablet 0  . diphenhydrAMINE (BENADRYL) 50 MG capsule Take 50 mg by mouth daily.     Marland Kitchen EPINEPHrine 0.3 mg/0.3 mL IJ SOAJ injection Inject 0.3 mg into the muscle once.     . Estradiol 10 MCG TABS vaginal tablet Place 1 tablet (10 mcg total) vaginally 2 (two) times a week. 24 tablet 3  . fexofenadine (ALLEGRA) 180 MG tablet Take 180 mg by mouth daily.      . fluticasone (FLONASE) 50 MCG/ACT nasal spray Place 2 sprays into both nostrils 2 (two) times daily.    . hydrochlorothiazide (HYDRODIURIL) 25 MG tablet TAKE 1 TABLET EVERY DAY. NEED PHYSICAL  WITH LABS FOR REFILLS [phone] 90 tablet 0  . levETIRAcetam (KEPPRA) 500 MG tablet Take 1,000 mg by mouth at bedtime.     Marland Kitchen linaclotide (LINZESS) 290 MCG CAPS capsule Take 580 mcg by mouth 2 (two) times daily.     . montelukast (SINGULAIR) 10 MG tablet Take 1 tablet (10 mg total) by mouth at bedtime. 90 tablet 3  . NEXIUM 40 MG capsule TAKE 1 CAPSULE TWICE DAILY. NEED A PHYSICAL WITH LABS FOR MORE REFILLS [phone]. 180 capsule 0  . nystatin (MYCOSTATIN/NYSTOP) powder Apply topically 4 (four) times daily. 30 g 1  . nystatin (MYCOSTATIN/NYSTOP) powder Apply 1 application topically 3 (three) times daily. 60 g 5  . nystatin ointment (MYCOSTATIN) Apply 1 application topically 2 (two) times daily. As needed 30 g 0  . potassium chloride (KLOR-CON 10) 10 MEQ tablet Take 1 tablet (10 mEq total) by mouth daily. 90 tablet 3  . PRESCRIPTION MEDICATION Inject 1 each into the muscle 2 (two) times a week. Allergy shot     . topiramate (TOPAMAX) 100 MG tablet Take 200 mg by mouth at bedtime.     . valACYclovir (VALTREX) 500 MG tablet TAKE 1 TABLET BY MOUTH  DAILY MAY TAKE ADDITIONAL 1 TABLET BY MOUTH IF NEEDED  FOR OUTBREAK 100 tablet 0  . VIIBRYD 40 MG TABS TAKE 1 TABLET (40 MG TOTAL) BY MOUTH DAILY. [place]  tablet 0   No facility-administered medications prior to visit.    Allergies  Allergen Reactions  . Other Other (See Comments)    Nylon sutures had to remove to allow healing  Vicryl sutures - do not use  . Percocet [Oxycodone-Acetaminophen] Other (See Comments)    "makes her crazy"  . Codeine Hives  . Dye Fdc Red [Red Dye] Hives and Other (See Comments)    Scan dye, broke out in hives all over once with a neck scan  . Morphine And Related Hives and Itching  . Vesicare [Solifenacin Succinate] Itching    ROS Review of Systems  Constitutional: Negative for appetite change, chills, fever and unexpected weight change.  Respiratory: Positive for shortness of breath. Negative for cough and  wheezing.   Cardiovascular: Positive for chest pain. Negative for palpitations and leg swelling.  Gastrointestinal: Negative for abdominal pain.  Neurological: Negative for dizziness and headaches.      Objective:    Physical Exam Vitals reviewed.  Constitutional:      General: She is not in acute distress.    Appearance: Normal appearance. She is not ill-appearing.  Cardiovascular:     Rate and Rhythm: Normal rate and regular rhythm.  Pulmonary:     Effort: Pulmonary effort is normal.     Breath sounds: Normal breath sounds. No wheezing or rales.  Musculoskeletal:     Cervical back: Neck supple.     Right lower leg: No edema.     Left lower leg: No edema.     Comments: No thoracic spine tenderness.  She does have some right parathoracic muscular tenderness.  No visible rash.  No visible swelling.  Neurological:     Mental Status: She is alert.     BP 134/76 (BP Location: Left Arm, Patient Position: Sitting, Cuff Size: Normal)   Pulse (!) 101   Temp 97.9 F (36.6 C) (Temporal)   Wt 280 lb 3.2 oz (127.1 kg)   SpO2 94%   BMI 45.23 kg/m  Wt Readings from Last 3 Encounters:  09/17/19 280 lb 3.2 oz (127.1 kg)  09/10/19 280 lb (127 kg)  04/03/19 272 lb 8 oz (123.6 kg)     Health Maintenance Due  Topic Date Due  . Hepatitis C Screening  Never done  . COVID-19 Vaccine (1) Never done  . HIV Screening  Never done    There are no preventive care reminders to display for this patient.  Lab Results  Component Value Date   TSH 3.670 12/24/2018   Lab Results  Component Value Date   WBC 9.4 09/10/2019   HGB 13.3 09/10/2019   HCT 41.5 09/10/2019   MCV 96.3 09/10/2019   PLT 265 09/10/2019   Lab Results  Component Value Date   NA 140 09/10/2019   K 3.6 09/10/2019   CO2 24 09/10/2019   GLUCOSE 122 (H) 09/10/2019   BUN 12 09/10/2019   CREATININE 1.33 (H) 09/10/2019   BILITOT 0.3 12/24/2018   ALKPHOS 103 12/24/2018   AST 40 12/24/2018   ALT 31 12/24/2018   PROT  6.9 12/24/2018   ALBUMIN 4.2 12/24/2018   CALCIUM 9.0 09/10/2019   ANIONGAP 13 09/10/2019   GFR 55.41 (L) 08/21/2017   Lab Results  Component Value Date   CHOL 189 12/24/2018   Lab Results  Component Value Date   HDL 45 12/24/2018   Lab Results  Component Value Date   LDLCALC 111 (H) 12/24/2018   Lab Results  Component Value  Date   TRIG 191 (H) 12/24/2018   Lab Results  Component Value Date   CHOLHDL 4.2 12/24/2018   Lab Results  Component Value Date   HGBA1C 5.8 (H) 12/24/2018      Assessment & Plan:   Patient presents with roughly 2-week history of right thoracic back pain with some atypical right-sided chest pains and intermittent dyspnea.  Recent ER evaluation as above unrevealing.  Suspect this is more likely musculoskeletal as she does clearly have some tenderness on palpation and also worse with movement.  However, she does complain of some dyspnea intermittently and does have pulse oximetry 94% today compared with 97% in the ER.  She does also have positive family history of PE and DVT in both parents.  -We discussed that D-dimers are nonspecific but will go ahead and check D-dimer level.  If positive consider CT angiogram chest -Otherwise, try over-the-counter lidocaine patch and continue muscle massage and topical sports creams  No orders of the defined types were placed in this encounter.   Follow-up: No follow-ups on file.    Carolann Littler, MD

## 2019-09-17 NOTE — Patient Instructions (Signed)
Consider trial of OTC lidocaine patch  Follow up or go to ER immediately for increased shortness of breath or worsening chest pain  We will call with lab results.

## 2019-09-18 DIAGNOSIS — M542 Cervicalgia: Secondary | ICD-10-CM | POA: Diagnosis not present

## 2019-09-18 DIAGNOSIS — M4316 Spondylolisthesis, lumbar region: Secondary | ICD-10-CM | POA: Diagnosis not present

## 2019-09-18 DIAGNOSIS — M5412 Radiculopathy, cervical region: Secondary | ICD-10-CM | POA: Diagnosis not present

## 2019-09-18 DIAGNOSIS — Z6841 Body Mass Index (BMI) 40.0 and over, adult: Secondary | ICD-10-CM | POA: Diagnosis not present

## 2019-09-19 DIAGNOSIS — J301 Allergic rhinitis due to pollen: Secondary | ICD-10-CM | POA: Diagnosis not present

## 2019-09-19 DIAGNOSIS — J3089 Other allergic rhinitis: Secondary | ICD-10-CM | POA: Diagnosis not present

## 2019-09-26 ENCOUNTER — Telehealth: Payer: Self-pay

## 2019-09-26 ENCOUNTER — Other Ambulatory Visit: Payer: Self-pay | Admitting: Anesthesiology

## 2019-09-26 DIAGNOSIS — M461 Sacroiliitis, not elsewhere classified: Secondary | ICD-10-CM | POA: Insufficient documentation

## 2019-09-26 DIAGNOSIS — M5136 Other intervertebral disc degeneration, lumbar region: Secondary | ICD-10-CM | POA: Insufficient documentation

## 2019-09-26 DIAGNOSIS — J3089 Other allergic rhinitis: Secondary | ICD-10-CM | POA: Diagnosis not present

## 2019-09-26 DIAGNOSIS — M791 Myalgia, unspecified site: Secondary | ICD-10-CM | POA: Insufficient documentation

## 2019-09-26 DIAGNOSIS — M47816 Spondylosis without myelopathy or radiculopathy, lumbar region: Secondary | ICD-10-CM | POA: Insufficient documentation

## 2019-09-26 DIAGNOSIS — M961 Postlaminectomy syndrome, not elsewhere classified: Secondary | ICD-10-CM | POA: Insufficient documentation

## 2019-09-26 DIAGNOSIS — M5412 Radiculopathy, cervical region: Secondary | ICD-10-CM

## 2019-09-26 DIAGNOSIS — J301 Allergic rhinitis due to pollen: Secondary | ICD-10-CM | POA: Diagnosis not present

## 2019-09-26 NOTE — Telephone Encounter (Signed)
Spoke with patient to review her medications and drug allergies before getting her scheduled for a cervical myelogram.  She was informed she will be here two hours, will need a driver and will need to be on strict bedrest for 24 hours after the procedure.  She stated an understanding that she needs to hold Wellbutrin and Viibryd for 48 hours before, and 24 hours after, the myelogram

## 2019-10-03 DIAGNOSIS — J3089 Other allergic rhinitis: Secondary | ICD-10-CM | POA: Diagnosis not present

## 2019-10-03 DIAGNOSIS — J301 Allergic rhinitis due to pollen: Secondary | ICD-10-CM | POA: Diagnosis not present

## 2019-10-04 ENCOUNTER — Ambulatory Visit
Admission: RE | Admit: 2019-10-04 | Discharge: 2019-10-04 | Disposition: A | Discharge status: Home / Self Care | Payer: Medicare PPO | Source: Ambulatory Visit | Attending: Anesthesiology | Admitting: Anesthesiology

## 2019-10-04 ENCOUNTER — Other Ambulatory Visit: Payer: Self-pay

## 2019-10-04 ENCOUNTER — Other Ambulatory Visit: Payer: Self-pay | Admitting: Anesthesiology

## 2019-10-04 DIAGNOSIS — M4316 Spondylolisthesis, lumbar region: Secondary | ICD-10-CM

## 2019-10-04 DIAGNOSIS — M5412 Radiculopathy, cervical region: Secondary | ICD-10-CM

## 2019-10-04 DIAGNOSIS — M5126 Other intervertebral disc displacement, lumbar region: Secondary | ICD-10-CM | POA: Diagnosis not present

## 2019-10-04 DIAGNOSIS — M5021 Other cervical disc displacement,  high cervical region: Secondary | ICD-10-CM | POA: Diagnosis not present

## 2019-10-04 DIAGNOSIS — Z981 Arthrodesis status: Secondary | ICD-10-CM | POA: Diagnosis not present

## 2019-10-04 MED ORDER — DIAZEPAM 5 MG PO TABS
10.0000 mg | ORAL_TABLET | Freq: Once | ORAL | Status: AC
  Administered 2019-10-04: 10 mg via ORAL

## 2019-10-04 MED ORDER — IOPAMIDOL (ISOVUE-M 300) INJECTION 61%
10.0000 mL | Freq: Once | INTRAMUSCULAR | Status: AC | PRN
  Administered 2019-10-04: 10 mL via INTRATHECAL

## 2019-10-04 NOTE — Discharge Instructions (Signed)
Myelogram Discharge Instructions  1. Go home and rest quietly for the next 24 hours.  It is important to lie flat for the next 24 hours.  Get up only to go to the restroom.  You may lie in the bed or on a couch on your back, your stomach, your left side or your right side.  You may have one pillow under your head.  You may have pillows between your knees while you are on your side or under your knees while you are on your back.  2. DO NOT drive today.  Recline the seat as far back as it will go, while still wearing your seat belt, on the way home.  3. You may get up to go to the bathroom as needed.  You may sit up for 10 minutes to eat.  You may resume your normal diet and medications unless otherwise indicated.  Drink lots of extra fluids today and tomorrow.  4. The incidence of headache, nausea, or vomiting is about 5% (one in 20 patients).  If you develop a headache, lie flat and drink plenty of fluids until the headache goes away.  Caffeinated beverages may be helpful.  If you develop severe nausea and vomiting or a headache that does not go away with flat bed rest, call [phone].  5. You may resume normal activities after your 24 hours of bed rest is over; however, do not exert yourself strongly or do any heavy lifting tomorrow. If when you get up you have a headache when standing, go back to bed and force fluids for another 24 hours.  6. Call your physician for a follow-up appointment.  The results of your myelogram will be sent directly to your physician by the following day.  7. If you have any questions or if complications develop after you arrive home, please call [phone].  Discharge instructions have been explained to the patient.  The patient, or the person responsible for the patient, fully understands these instructions.  YOU MAY RESTART YOUR WELLBUTRIN AND VIIBRYD TOMORROW 10/05/19 AT 1:00PM.

## 2019-10-05 DIAGNOSIS — R519 Headache, unspecified: Secondary | ICD-10-CM | POA: Diagnosis not present

## 2019-10-05 DIAGNOSIS — G8929 Other chronic pain: Secondary | ICD-10-CM | POA: Diagnosis not present

## 2019-10-05 DIAGNOSIS — R112 Nausea with vomiting, unspecified: Secondary | ICD-10-CM | POA: Diagnosis not present

## 2019-10-05 DIAGNOSIS — M549 Dorsalgia, unspecified: Secondary | ICD-10-CM | POA: Diagnosis not present

## 2019-10-05 DIAGNOSIS — R200 Anesthesia of skin: Secondary | ICD-10-CM | POA: Diagnosis not present

## 2019-10-05 DIAGNOSIS — R202 Paresthesia of skin: Secondary | ICD-10-CM | POA: Diagnosis not present

## 2019-10-05 DIAGNOSIS — R0789 Other chest pain: Secondary | ICD-10-CM | POA: Diagnosis not present

## 2019-10-05 DIAGNOSIS — I10 Essential (primary) hypertension: Secondary | ICD-10-CM | POA: Diagnosis not present

## 2019-10-05 DIAGNOSIS — N179 Acute kidney failure, unspecified: Secondary | ICD-10-CM | POA: Diagnosis not present

## 2019-10-05 DIAGNOSIS — R110 Nausea: Secondary | ICD-10-CM | POA: Diagnosis not present

## 2019-10-05 DIAGNOSIS — R1110 Vomiting, unspecified: Secondary | ICD-10-CM | POA: Diagnosis not present

## 2019-10-05 DIAGNOSIS — R072 Precordial pain: Secondary | ICD-10-CM | POA: Diagnosis not present

## 2019-10-05 DIAGNOSIS — E876 Hypokalemia: Secondary | ICD-10-CM | POA: Diagnosis not present

## 2019-10-07 ENCOUNTER — Other Ambulatory Visit: Payer: Self-pay | Admitting: Anesthesiology

## 2019-10-10 ENCOUNTER — Telehealth: Payer: Self-pay | Admitting: Family Medicine

## 2019-10-10 NOTE — Telephone Encounter (Signed)
That is OK with me

## 2019-10-10 NOTE — Telephone Encounter (Signed)
Pt would like to transfer care to Dr. Regis Bill from Dr. Elease Hashimoto. Pt says, she has a lot of friends that see Dr. Regis Bill. Thanks

## 2019-10-17 DIAGNOSIS — J301 Allergic rhinitis due to pollen: Secondary | ICD-10-CM | POA: Diagnosis not present

## 2019-10-17 DIAGNOSIS — J3089 Other allergic rhinitis: Secondary | ICD-10-CM | POA: Diagnosis not present

## 2019-10-24 DIAGNOSIS — J3089 Other allergic rhinitis: Secondary | ICD-10-CM | POA: Diagnosis not present

## 2019-10-24 DIAGNOSIS — J301 Allergic rhinitis due to pollen: Secondary | ICD-10-CM | POA: Diagnosis not present

## 2019-10-28 ENCOUNTER — Telehealth: Payer: Self-pay | Admitting: Family Medicine

## 2019-10-28 NOTE — Telephone Encounter (Signed)
Spoke with pt to schedule AWV she declined and stated she wanted to change providers per her mychart note.

## 2019-10-30 DIAGNOSIS — Z6841 Body Mass Index (BMI) 40.0 and over, adult: Secondary | ICD-10-CM | POA: Diagnosis not present

## 2019-10-30 DIAGNOSIS — Z9689 Presence of other specified functional implants: Secondary | ICD-10-CM | POA: Diagnosis not present

## 2019-10-30 DIAGNOSIS — M4316 Spondylolisthesis, lumbar region: Secondary | ICD-10-CM | POA: Diagnosis not present

## 2019-11-07 DIAGNOSIS — J3089 Other allergic rhinitis: Secondary | ICD-10-CM | POA: Diagnosis not present

## 2019-11-07 DIAGNOSIS — J301 Allergic rhinitis due to pollen: Secondary | ICD-10-CM | POA: Diagnosis not present

## 2019-11-12 DIAGNOSIS — J3089 Other allergic rhinitis: Secondary | ICD-10-CM | POA: Diagnosis not present

## 2019-11-12 DIAGNOSIS — J301 Allergic rhinitis due to pollen: Secondary | ICD-10-CM | POA: Diagnosis not present

## 2019-11-15 ENCOUNTER — Other Ambulatory Visit: Payer: Self-pay | Admitting: Family Medicine

## 2019-11-15 NOTE — Telephone Encounter (Signed)
I am not taking new patients  .  Would be ok to fill in when PCP not available  For as needed help.

## 2019-11-18 DIAGNOSIS — M4316 Spondylolisthesis, lumbar region: Secondary | ICD-10-CM | POA: Diagnosis not present

## 2019-11-18 DIAGNOSIS — M545 Low back pain: Secondary | ICD-10-CM | POA: Diagnosis not present

## 2019-11-18 NOTE — Telephone Encounter (Signed)
Please see message. Dr. Regis Bill is not taking any new patients. Thank you.

## 2019-12-05 ENCOUNTER — Other Ambulatory Visit: Payer: Self-pay | Admitting: Family Medicine

## 2019-12-05 ENCOUNTER — Other Ambulatory Visit: Payer: Self-pay | Admitting: Obstetrics and Gynecology

## 2019-12-05 DIAGNOSIS — M5412 Radiculopathy, cervical region: Secondary | ICD-10-CM | POA: Diagnosis not present

## 2019-12-05 DIAGNOSIS — B372 Candidiasis of skin and nail: Secondary | ICD-10-CM

## 2019-12-06 NOTE — Telephone Encounter (Signed)
Medication refill request: Nystatin 10000unit Last AEX:  12/24/18 Next AEX: 12/25/19 Last MMG (if hormonal medication request): NA Refill authorized: 30g/0

## 2019-12-25 ENCOUNTER — Ambulatory Visit: Payer: Medicare PPO | Admitting: Obstetrics and Gynecology

## 2019-12-25 NOTE — Progress Notes (Deleted)
59 y.o. G64P1001 Married White or Caucasian Not Hispanic or Latino female here for annual exam.      No LMP recorded. Patient has had a hysterectomy.          Sexually active: {yes no:314532}  The current method of family planning is {contraception:315051}.    Exercising: {yes no:314532}  {types:19826} Smoker:  {YES P5382123  Health Maintenance:  Pap:  08-03-11 WNL History of abnormal Pap:  no MMG: 11/15/17 density B Bi-rads 1 neg   *** BMD:  Never  Colonoscopy: 2019 f/u 3 years polyps TDaP:  08/09/12 Gardasil: NA   reports that she has never smoked. She has never used smokeless tobacco. She reports that she does not drink alcohol and does not use drugs.  Past Medical History:  Diagnosis Date  . Allergy    takes Allegra daily and uses nasal spray daily.Takes Benadryl as needed.Uses eye drops daily  . Anemia    B 12 shots monthly  . Anxiety    takes Xanax as needed  . Arthritis    osteoporosis  . Asthma    Proventil inhaler as needed  . Chronic back pain    DDD and stenosis  . Complication of anesthesia    pt states she was woke up during surgery  . Depression    takes Wellbutrin daily  . Fibromyalgia   . GERD (gastroesophageal reflux disease)    takes Nexium daily  . Headache(784.0)    takes Topamax daily as needed  . History of bronchitis    2016  . History of colon polyps    benign  . History of vertigo    took Meclizine several yrs ago  . Hypertension    followed by [facility]  . IBS (irritable bowel syndrome)    takes LInzess daily  . Insomnia   . Joint pain   . Muscle spasm    takes Flexeril daily as needed  . Neuromuscular disorder (HCC)    numbness arms legs,feet   . Nocturia   . Osteoporosis   . Pain    takes Keppra and Topamax daily  . Peripheral edema    takes HCTZ daily  . Sleep apnea    has a CPAP but doesn't wear often  . Urinary frequency   . Urinary incontinence   . Urinary urgency     Past Surgical History:   Procedure Laterality Date  . ABDOMINAL HYSTERECTOMY    . ANTERIOR FUSION CERVICAL SPINE    . BACK SURGERY  2012  . bladder tacked    . CARPAL TUNNEL RELEASE     R&L  . CESAREAN SECTION  1983  . CHOLECYSTECTOMY    . COLONOSCOPY    . COLONOSCOPY WITH PROPOFOL N/A 10/06/2017   Procedure: COLONOSCOPY WITH PROPOFOL;  Surgeon: Carol Ada, MD;  Location: [facility];  Service: Endoscopy;  Laterality: N/A;  . ESOPHAGEAL DILATION    . KNEE ARTHROSCOPY     L KNEE  . LUMBAR LAMINECTOMY    . LUMBAR LAMINECTOMY/DECOMPRESSION MICRODISCECTOMY  02/25/2011   Procedure: LUMBAR LAMINECTOMY/DECOMPRESSION MICRODISCECTOMY;  Surgeon: Floyce Stakes;  Location: [place] [facility];  Service: Neurosurgery;  Laterality: N/A;  Lumbar Four-Five Microdiscectomy  . LUMBAR WOUND DEBRIDEMENT  03/18/2011   Procedure: LUMBAR WOUND DEBRIDEMENT;  Surgeon: Floyce Stakes;  Location: [place] [facility];  Service: Neurosurgery;  Laterality: N/A;  Exploration of Lumbar wound  . POLYPECTOMY  10/06/2017   Procedure: POLYPECTOMY;  Surgeon: Carol Ada, MD;  Location: [facility];  Service: Endoscopy;;  . SEPTOPLASTY  10+yrs ago  . SPINAL CORD STIMULATOR INSERTION N/A 12/11/2015   Procedure: LUMBAR SPINAL CORD STIMULATOR INSERTION;  Surgeon: Clydell Hakim, MD;  Location: [facility];  Service: Neurosurgery;  Laterality: N/A;    Current Outpatient Medications  Medication Sig Dispense Refill  . albuterol (VENTOLIN HFA) 108 (90 Base) MCG/ACT inhaler     . ALPRAZolam (XANAX) 1 MG tablet Take 1 tablet (1 mg total) by mouth 3 (three) times daily as needed for anxiety. 30 tablet 0  . azelastine (ASTELIN) 0.1 % nasal spray Place 1 spray into both nostrils 2 (two) times daily. Use in each nostril as directed    . budesonide-formoterol (SYMBICORT) 160-4.5 MCG/ACT inhaler Inhale 2 puffs into the lungs 2 (two) times daily.     Marland Kitchen buPROPion (WELLBUTRIN XL) 150 MG 24 hr tablet TAKE 3 TABLETS (450 MG TOTAL) BY MOUTH DAILY.  NEED APPT AND LABWORK  FOR MORE REFILLS 270 tablet 0  . Calcium Carbonate-Vitamin D (CALCIUM-VITAMIN D3 PO) Take 1 tablet by mouth daily. (Patient not taking: Reported on 09/26/2019)    . cyanocobalamin (,VITAMIN B-12,) 1000 MCG/ML injection Inject 1 mL (1,000 mcg total) into the muscle every 30 (thirty) days. 10 mL 1  . cyclobenzaprine (FLEXERIL) 10 MG tablet Take 1 tablet (10 mg total) by mouth 2 (two) times daily as needed for muscle spasms. 20 tablet 0  . diphenhydrAMINE (BENADRYL) 50 MG capsule Take 50 mg by mouth daily.     Marland Kitchen EPINEPHrine 0.3 mg/0.3 mL IJ SOAJ injection Inject 0.3 mg into the muscle once.     . fexofenadine (ALLEGRA) 180 MG tablet Take 180 mg by mouth daily.      . fluticasone (FLONASE) 50 MCG/ACT nasal spray Place 2 sprays into both nostrils 2 (two) times daily.    . hydrochlorothiazide (HYDRODIURIL) 25 MG tablet TAKE 1 TABLET EVERY DAY. NEED PHYSICAL WITH LABS FOR REFILLS [phone] 90 tablet 0  . levETIRAcetam (KEPPRA) 500 MG tablet Take 1,000 mg by mouth at bedtime.     Marland Kitchen linaclotide (LINZESS) 290 MCG CAPS capsule Take 580 mcg by mouth 2 (two) times daily.     . montelukast (SINGULAIR) 10 MG tablet Take 1 tablet (10 mg total) by mouth at bedtime. 90 tablet 3  . NEXIUM 40 MG capsule TAKE 1 CAPSULE TWICE DAILY. NEED A PHYSICAL WITH LABS FOR MORE REFILLS [phone]. 180 capsule 0  . nystatin (MYCOSTATIN/NYSTOP) powder Apply topically 4 (four) times daily. 30 g 1  . nystatin ointment (MYCOSTATIN) APPLY 1 APPLICATION TOPICALLY 2 (TWO) TIMES DAILY AS NEEDED 30 g 0  . potassium chloride (KLOR-CON 10) 10 MEQ tablet Take 1 tablet (10 mEq total) by mouth daily. 90 tablet 3  . PRESCRIPTION MEDICATION Inject 1 each into the muscle 2 (two) times a week. Allergy shot     . topiramate (TOPAMAX) 100 MG tablet Take 200 mg by mouth at bedtime.     . valACYclovir (VALTREX) 500 MG tablet TAKE 1 TABLET DAILY MAY TAKE ADDITIONAL 1 TABLET IF NEEDED  FOR OUTBREAK. NEED PHYSICAL WITH LABS FOR REFILLS [phone] 100  tablet 0  . VIIBRYD 40 MG TABS TAKE 1 TABLET (40 MG TOTAL) BY MOUTH DAILY. 90 tablet 0   No current facility-administered medications for this visit.    Family History  Problem Relation Age of Onset  . Arthritis Mother   . Hypertension Mother   . Kidney disease Mother   .  COPD Mother   . Heart disease Mother 54       CAD  . Diabetes Mother   . Mental illness Mother   . Arthritis Father   . Diabetes Father   . Heart disease Father   . Cancer Father        prostate cancer  . Mental illness Father        Alzheimers Dementia  . Cancer Maternal Grandmother        brain tumor  . Cancer Maternal Grandfather        lung  . Diabetes Maternal Grandfather   . Early death Brother   . Anesthesia problems Neg Hx   . Hypotension Neg Hx   . Malignant hyperthermia Neg Hx   . Pseudochol deficiency Neg Hx     Review of Systems  Exam:   There were no vitals taken for this visit.  Weight change: @WEIGHTCHANGE @ Height:      Ht Readings from Last 3 Encounters:  09/10/19 5\' 6"  (1.676 m)  04/03/19 5' 5.75" (1.67 m)  12/24/18 5' 5.75" (1.67 m)    General appearance: alert, cooperative and appears stated age Head: Normocephalic, without obvious abnormality, atraumatic Neck: no adenopathy, supple, symmetrical, trachea midline and thyroid {CHL AMB PHY EX THYROID NORM DEFAULT:[id number]::"normal to inspection and palpation"} Lungs: clear to auscultation bilaterally Cardiovascular: regular rate and rhythm Breasts: {Exam; breast:13139::"normal appearance, no masses or tenderness"} Abdomen: soft, non-tender; non distended,  no masses,  no organomegaly Extremities: extremities normal, atraumatic, no cyanosis or edema Skin: Skin color, texture, turgor normal. No rashes or lesions Lymph nodes: Cervical, supraclavicular, and axillary nodes normal. No abnormal inguinal nodes palpated Neurologic: Grossly normal   Pelvic: External genitalia:  no lesions              Urethra:  normal appearing  urethra with no masses, tenderness or lesions              Bartholins and Skenes: normal                 Vagina: normal appearing vagina with normal color and discharge, no lesions              Cervix: {CHL AMB PHY EX CERVIX NORM DEFAULT:[id number]::"no lesions"}               Bimanual Exam:  Uterus:  {CHL AMB PHY EX UTERUS NORM DEFAULT:[id number]::"normal size, contour, position, consistency, mobility, non-tender"}              Adnexa: {CHL AMB PHY EX ADNEXA NO MASS [phone]::"no mass, fullness, tenderness"}               Rectovaginal: Confirms               Anus:  normal sphincter tone, no lesions  *** chaperoned for the exam.  A:  Well Woman with normal exam  P:

## 2020-01-08 DIAGNOSIS — M4316 Spondylolisthesis, lumbar region: Secondary | ICD-10-CM | POA: Diagnosis not present

## 2020-01-08 DIAGNOSIS — Z9689 Presence of other specified functional implants: Secondary | ICD-10-CM | POA: Diagnosis not present

## 2020-01-08 DIAGNOSIS — M5412 Radiculopathy, cervical region: Secondary | ICD-10-CM | POA: Diagnosis not present

## 2020-02-28 ENCOUNTER — Other Ambulatory Visit: Payer: Self-pay

## 2020-02-28 ENCOUNTER — Encounter: Payer: Self-pay | Admitting: Obstetrics and Gynecology

## 2020-02-28 ENCOUNTER — Ambulatory Visit (INDEPENDENT_AMBULATORY_CARE_PROVIDER_SITE_OTHER): Payer: Medicare PPO | Admitting: Obstetrics and Gynecology

## 2020-02-28 VITALS — BP 146/88 | HR 90 | Resp 14 | Ht 66.0 in | Wt 286.4 lb

## 2020-02-28 DIAGNOSIS — B372 Candidiasis of skin and nail: Secondary | ICD-10-CM | POA: Diagnosis not present

## 2020-02-28 DIAGNOSIS — Z8659 Personal history of other mental and behavioral disorders: Secondary | ICD-10-CM | POA: Diagnosis not present

## 2020-02-28 DIAGNOSIS — K581 Irritable bowel syndrome with constipation: Secondary | ICD-10-CM | POA: Insufficient documentation

## 2020-02-28 DIAGNOSIS — Z860100 Personal history of colon polyps, unspecified: Secondary | ICD-10-CM | POA: Insufficient documentation

## 2020-02-28 DIAGNOSIS — Z01419 Encounter for gynecological examination (general) (routine) without abnormal findings: Secondary | ICD-10-CM | POA: Diagnosis not present

## 2020-02-28 DIAGNOSIS — R1310 Dysphagia, unspecified: Secondary | ICD-10-CM | POA: Insufficient documentation

## 2020-02-28 DIAGNOSIS — N952 Postmenopausal atrophic vaginitis: Secondary | ICD-10-CM | POA: Diagnosis not present

## 2020-02-28 DIAGNOSIS — Z6841 Body Mass Index (BMI) 40.0 and over, adult: Secondary | ICD-10-CM | POA: Diagnosis not present

## 2020-02-28 DIAGNOSIS — R194 Change in bowel habit: Secondary | ICD-10-CM | POA: Insufficient documentation

## 2020-02-28 MED ORDER — NYSTATIN 100000 UNIT/GM EX OINT: TOPICAL_OINTMENT | CUTANEOUS | 0 refills | Status: DC

## 2020-02-28 MED ORDER — ESTRADIOL 10 MCG VA TABS
1.0000 | ORAL_TABLET | VAGINAL | 3 refills | Status: DC
Start: 2020-03-02 — End: 2022-09-02

## 2020-02-28 NOTE — Progress Notes (Signed)
59 y.o. G64P1001 Married White or Caucasian Not Hispanic or Latino female here for annual exam.  H/O hysterectomy.  She has vaginal dryness, she hasn't been using vaginal estrogen consistently. She would like a refill.   H/O OAB, better than it was. She leaks a small amount daily on the way to the bathroom. Up 2-3 x a night to void. Tolerable. Voids normal amounts. She has done PT in the past, knows what to do.  She struggles with dry mouth, constantly drinking water.  She hasn't seen her primary, every thing is virtual.   H/O chronic pain, she see's a Restaurant manager, fast food and a pain specialist. She needs back and neck surgery. She is worried about surgery during covid.   She has depression, on medication, not well controlled. Doesn't want to do counseling. She is so limited with her chronic pain.     No LMP recorded. Patient has had a hysterectomy.          Sexually active: No.  The current method of family planning is status post hysterectomy.    Exercising: No.  The patient does not participate in regular exercise at present. Smoker:  no  Health Maintenance: Pap:  08-03-11 negative  History of abnormal Pap:  no MMG:  11-15-17 density B/BIRADS 1 negative  BMD:   never Colonoscopy: 10-06-17 polyp, f/u in 3 years  TDaP:  08-09-12  Gardasil: n/a   reports that she has never smoked. She has never used smokeless tobacco. She reports that she does not drink alcohol and does not use drugs. 1 daughter, 2 grandsons 3 step grandchildren. Raised 4 grandchildren. On disability for pain  Past Medical History:  Diagnosis Date  . Allergy    takes Allegra daily and uses nasal spray daily.Takes Benadryl as needed.Uses eye drops daily  . Anemia    B 12 shots monthly  . Anxiety    takes Xanax as needed  . Arthritis    osteoporosis  . Asthma    Proventil inhaler as needed  . Chronic back pain    DDD and stenosis  . Complication of anesthesia    pt states she was woke up during surgery  . Depression     takes Wellbutrin daily  . Fibromyalgia   . GERD (gastroesophageal reflux disease)    takes Nexium daily  . Headache(784.0)    takes Topamax daily as needed  . History of bronchitis    2016  . History of colon polyps    benign  . History of vertigo    took Meclizine several yrs ago  . Hypertension    followed by [facility]  . IBS (irritable bowel syndrome)    takes LInzess daily  . Insomnia   . Joint pain   . Muscle spasm    takes Flexeril daily as needed  . Neuromuscular disorder (HCC)    numbness arms legs,feet   . Nocturia   . Osteoporosis   . Pain    takes Keppra and Topamax daily  . Peripheral edema    takes HCTZ daily  . Sleep apnea    has a CPAP but doesn't wear often  . Urinary frequency   . Urinary incontinence   . Urinary urgency     Past Surgical History:  Procedure Laterality Date  . ABDOMINAL HYSTERECTOMY    . ANTERIOR FUSION CERVICAL SPINE    . BACK SURGERY  2012  . bladder tacked    . CARPAL TUNNEL RELEASE  R&L  . CESAREAN SECTION  1983  . CHOLECYSTECTOMY    . COLONOSCOPY    . COLONOSCOPY WITH PROPOFOL N/A 10/06/2017   Procedure: COLONOSCOPY WITH PROPOFOL;  Surgeon: Carol Ada, MD;  Location: [facility];  Service: Endoscopy;  Laterality: N/A;  . ESOPHAGEAL DILATION    . KNEE ARTHROSCOPY     L KNEE  . LUMBAR LAMINECTOMY    . LUMBAR LAMINECTOMY/DECOMPRESSION MICRODISCECTOMY  02/25/2011   Procedure: LUMBAR LAMINECTOMY/DECOMPRESSION MICRODISCECTOMY;  Surgeon: Floyce Stakes;  Location: [place] [facility];  Service: Neurosurgery;  Laterality: N/A;  Lumbar Four-Five Microdiscectomy  . LUMBAR WOUND DEBRIDEMENT  03/18/2011   Procedure: LUMBAR WOUND DEBRIDEMENT;  Surgeon: Floyce Stakes;  Location: [place] [facility];  Service: Neurosurgery;  Laterality: N/A;  Exploration of Lumbar wound  . POLYPECTOMY  10/06/2017   Procedure: POLYPECTOMY;  Surgeon: Carol Ada, MD;  Location: [facility];  Service: Endoscopy;;  . SEPTOPLASTY   10+yrs ago  . SPINAL CORD STIMULATOR INSERTION N/A 12/11/2015   Procedure: LUMBAR SPINAL CORD STIMULATOR INSERTION;  Surgeon: Clydell Hakim, MD;  Location: [place] [facility];  Service: Neurosurgery;  Laterality: N/A;    Current Outpatient Medications  Medication Sig Dispense Refill  . albuterol (VENTOLIN HFA) 108 (90 Base) MCG/ACT inhaler     . ALPRAZolam (XANAX) 1 MG tablet Take 1 tablet (1 mg total) by mouth 3 (three) times daily as needed for anxiety. 30 tablet 0  . azelastine (ASTELIN) 0.1 % nasal spray Place 1 spray into both nostrils 2 (two) times daily. Use in each nostril as directed    . budesonide-formoterol (SYMBICORT) 160-4.5 MCG/ACT inhaler Inhale 2 puffs into the lungs 2 (two) times daily.     Marland Kitchen buPROPion (WELLBUTRIN XL) 150 MG 24 hr tablet TAKE 3 TABLETS (450 MG TOTAL) BY MOUTH DAILY.  NEED APPT AND LABWORK FOR MORE REFILLS 270 tablet 0  . Calcium Carbonate-Vitamin D (CALCIUM-VITAMIN D3 PO) Take 1 tablet by mouth daily.    . cyanocobalamin (,VITAMIN B-12,) 1000 MCG/ML injection Inject 1 mL (1,000 mcg total) into the muscle every 30 (thirty) days. 10 mL 1  . cyclobenzaprine (FLEXERIL) 10 MG tablet Take 1 tablet (10 mg total) by mouth 2 (two) times daily as needed for muscle spasms. 20 tablet 0  . diphenhydrAMINE (BENADRYL) 50 MG capsule Take 50 mg by mouth daily.    Marland Kitchen EPINEPHrine 0.3 mg/0.3 mL IJ SOAJ injection Inject 0.3 mg into the muscle once.     . fexofenadine (ALLEGRA) 180 MG tablet Take 180 mg by mouth daily.    . fluticasone (FLONASE) 50 MCG/ACT nasal spray Place 2 sprays into both nostrils 2 (two) times daily.    . hydrochlorothiazide (HYDRODIURIL) 25 MG tablet TAKE 1 TABLET EVERY DAY. NEED PHYSICAL WITH LABS FOR REFILLS [phone] 90 tablet 0  . levETIRAcetam (KEPPRA) 500 MG tablet Take 1,000 mg by mouth at bedtime.     Marland Kitchen linaclotide (LINZESS) 290 MCG CAPS capsule Take 580 mcg by mouth 2 (two) times daily.     . montelukast (SINGULAIR) 10 MG tablet Take 1 tablet (10 mg total)  by mouth at bedtime. 90 tablet 3  . NEXIUM 40 MG capsule TAKE 1 CAPSULE TWICE DAILY. NEED A PHYSICAL WITH LABS FOR MORE REFILLS [phone]. 180 capsule 0  . nystatin (MYCOSTATIN/NYSTOP) powder Apply topically 4 (four) times daily. 30 g 1  . nystatin ointment (MYCOSTATIN) APPLY 1 APPLICATION TOPICALLY 2 (TWO) TIMES DAILY AS NEEDED 30 g 0  . PILOCARPINE HCL PO  Take 5 mg by mouth daily.    . potassium chloride (KLOR-CON 10) 10 MEQ tablet Take 1 tablet (10 mEq total) by mouth daily. 90 tablet 3  . PRESCRIPTION MEDICATION Inject 1 each into the muscle 2 (two) times a week. Allergy shot    . topiramate (TOPAMAX) 100 MG tablet Take 200 mg by mouth at bedtime.     . valACYclovir (VALTREX) 500 MG tablet TAKE 1 TABLET DAILY MAY TAKE ADDITIONAL 1 TABLET IF NEEDED  FOR OUTBREAK. NEED PHYSICAL WITH LABS FOR REFILLS [phone] 100 tablet 0  . VIIBRYD 40 MG TABS TAKE 1 TABLET (40 MG TOTAL) BY MOUTH DAILY. 90 tablet 0   No current facility-administered medications for this visit.    Family History  Problem Relation Age of Onset  . Arthritis Mother   . Hypertension Mother   . Kidney disease Mother   . COPD Mother   . Heart disease Mother 60       CAD  . Diabetes Mother   . Mental illness Mother   . Arthritis Father   . Diabetes Father   . Heart disease Father   . Cancer Father        prostate cancer  . Mental illness Father        Alzheimers Dementia  . Cancer Maternal Grandmother        brain tumor  . Cancer Maternal Grandfather        lung  . Diabetes Maternal Grandfather   . Early death Brother   . Anesthesia problems Neg Hx   . Hypotension Neg Hx   . Malignant hyperthermia Neg Hx   . Pseudochol deficiency Neg Hx     Review of Systems  All other systems reviewed and are negative.   Exam:   BP (!) 146/88 (BP Location: Right Arm, Patient Position: Sitting, Cuff Size: Large)   Pulse 90   Resp 14   Ht 5\' 6"  (1.676 m)   Wt 286 lb 6.4 oz (129.9 kg)   BMI 46.23 kg/m   Weight  change: @WEIGHTCHANGE @ Height:   Height: 5\' 6"  (167.6 cm)  Ht Readings from Last 3 Encounters:  02/28/20 5\' 6"  (1.676 m)  09/10/19 5\' 6"  (1.676 m)  04/03/19 5' 5.75" (1.67 m)    General appearance: alert, cooperative and appears stated age Head: Normocephalic, without obvious abnormality, atraumatic Neck: no adenopathy, supple, symmetrical, trachea midline and thyroid normal to inspection and palpation Lungs: clear to auscultation bilaterally Cardiovascular: regular rate and rhythm Breasts: normal appearance, no masses or tenderness Abdomen: soft, non-tender; non distended,  no masses,  no organomegaly Extremities: extremities normal, atraumatic, no cyanosis or edema Skin: Skin color, texture, turgor normal. Mild erythematous rash in bilateral groin, under panus and breasts c/w candida intertrigo Lymph nodes: Cervical, supraclavicular, and axillary nodes normal. No abnormal inguinal nodes palpated Neurologic: Grossly normal   Pelvic: External genitalia:  no lesions              Urethra:  normal appearing urethra with no masses, tenderness or lesions              Bartholins and Skenes: normal                 Vagina: atrophic appearing vagina with normal color and discharge, no lesions              Cervix: absent               Bimanual Exam:  Uterus:  uterus absent              Adnexa: no mass, fullness, tenderness               Rectovaginal: Confirms               Anus:  normal sphincter tone, no lesions  Karmen Bongo chaperoned for the exam.  A:  Well Woman with normal exam  Vaginal atrophy  OAB, tolerable  Chronic pain, need back and neck surgery  BMI 46  Candida intertrigo  Depression stems from her chronic pain and limited function. On medication, declines counseling  P:   No pap needed  She will schedule her mammogram  Vaginal estrogen script sent (she is aware she shouldn't be using if she doesn't get her mammogram  Colonoscopy next summer  Discussed breast self  exam  Discussed calcium and vit D intake  She will need to get her labs with her primary

## 2020-02-28 NOTE — Patient Instructions (Signed)

## 2020-03-09 ENCOUNTER — Other Ambulatory Visit: Payer: Self-pay | Admitting: Obstetrics and Gynecology

## 2020-03-09 DIAGNOSIS — M5412 Radiculopathy, cervical region: Secondary | ICD-10-CM | POA: Diagnosis not present

## 2020-03-09 DIAGNOSIS — Z1231 Encounter for screening mammogram for malignant neoplasm of breast: Secondary | ICD-10-CM

## 2020-03-11 ENCOUNTER — Other Ambulatory Visit: Payer: Self-pay | Admitting: Family Medicine

## 2020-03-11 NOTE — Telephone Encounter (Signed)
Pt is calling in stating that she is out of the following Rx bupropion (WELLBUTRIN XL) 150 MG, Nexium 40 MG, VIIBRYD 40 MG, hydrochlorothiazide (HYDRODIURIL) 25 MG, valacyclovir (VALTREX) 500 MG, montelukast (SINGULAIR) 10 MG.   Pharm:  [employer] [facility]

## 2020-03-26 ENCOUNTER — Other Ambulatory Visit: Payer: Self-pay

## 2020-03-27 ENCOUNTER — Ambulatory Visit (INDEPENDENT_AMBULATORY_CARE_PROVIDER_SITE_OTHER): Payer: Medicare PPO

## 2020-03-27 ENCOUNTER — Ambulatory Visit: Payer: Medicare PPO | Admitting: Family Medicine

## 2020-03-27 ENCOUNTER — Encounter: Payer: Self-pay | Admitting: Family Medicine

## 2020-03-27 VITALS — BP 120/74 | HR 110 | Ht 66.0 in | Wt 290.0 lb

## 2020-03-27 DIAGNOSIS — R059 Cough, unspecified: Secondary | ICD-10-CM | POA: Diagnosis not present

## 2020-03-27 DIAGNOSIS — I10 Essential (primary) hypertension: Secondary | ICD-10-CM | POA: Diagnosis not present

## 2020-03-27 DIAGNOSIS — R053 Chronic cough: Secondary | ICD-10-CM

## 2020-03-27 DIAGNOSIS — M62838 Other muscle spasm: Secondary | ICD-10-CM | POA: Diagnosis not present

## 2020-03-27 LAB — BASIC METABOLIC PANEL
BUN: 12 mg/dL (ref 6–23)
CO2: 26 mEq/L (ref 19–32)
Calcium: 9.7 mg/dL (ref 8.4–10.5)
Chloride: 107 mEq/L (ref 96–112)
Creatinine, Ser: 1.22 mg/dL — ABNORMAL HIGH (ref 0.40–1.20)
GFR: 48.26 mL/min — ABNORMAL LOW (ref >60.00)
Glucose, Bld: 93 mg/dL (ref 70–99)
Potassium: 4 mEq/L (ref 3.5–5.1)
Sodium: 142 mEq/L (ref 135–145)

## 2020-03-27 LAB — MAGNESIUM: Magnesium: 1.8 mg/dL (ref 1.5–2.5)

## 2020-03-27 MED ORDER — TIZANIDINE HCL 2 MG PO TABS: 2.0000 mg | ORAL_TABLET | Freq: Three times a day (TID) | ORAL | 0 refills | Status: DC | PRN

## 2020-03-27 NOTE — Progress Notes (Signed)
Established Patient Office Visit  Subjective:  Patient ID: Taylor Ritter, female    DOB: November 15, 1960  Age: 60 y.o. MRN: [id number]  CC:  Chief Complaint  Patient presents with  . Spasms    HPI Taylor Ritter presents for the following issues  Her main issue is right mid thoracic back muscle "spasms ".  She states for past months she has intermittent muscle tightening almost more like a cramp.  She feels she is hydrating well.  She does have past history of low potassium.  She is on HCTZ but takes potassium replacement.  Back pain symptoms are intermittent.  Pain is not worse with movement.  Pain can occur at rest.  Denies any muscle cramps and other muscles such as legs or hands.  No clear precipitating factors.   She also relates cough for months.  No active GERD symptoms.  No active postnasal drip symptoms.  Never smoked.  No obvious wheezing.  No ACE inhibitor use.  No appetite or weight changes.  No hemoptysis.  Past Medical History:  Diagnosis Date  . Allergy    takes Allegra daily and uses nasal spray daily.Takes Benadryl as needed.Uses eye drops daily  . Anemia    B 12 shots monthly  . Anxiety    takes Xanax as needed  . Arthritis    osteoporosis  . Asthma    Proventil inhaler as needed  . Chronic back pain    DDD and stenosis  . Complication of anesthesia    pt states she was woke up during surgery  . Depression    takes Wellbutrin daily  . Fibromyalgia   . GERD (gastroesophageal reflux disease)    takes Nexium daily  . Headache(784.0)    takes Topamax daily as needed  . History of bronchitis    2016  . History of colon polyps    benign  . History of vertigo    took Meclizine several yrs ago  . Hypertension    followed by [facility]  . IBS (irritable bowel syndrome)    takes LInzess daily  . Insomnia   . Joint pain   . Muscle spasm    takes Flexeril daily as needed  . Neuromuscular disorder (HCC)    numbness arms legs,feet    . Nocturia   . Osteoporosis   . Pain    takes Keppra and Topamax daily  . Peripheral edema    takes HCTZ daily  . Sleep apnea    has a CPAP but doesn't wear often  . Urinary frequency   . Urinary incontinence   . Urinary urgency     Past Surgical History:  Procedure Laterality Date  . ABDOMINAL HYSTERECTOMY    . ANTERIOR FUSION CERVICAL SPINE    . BACK SURGERY  2012  . bladder tacked    . CARPAL TUNNEL RELEASE     R&L  . CESAREAN SECTION  1983  . CHOLECYSTECTOMY    . COLONOSCOPY    . COLONOSCOPY WITH PROPOFOL N/A 10/06/2017   Procedure: COLONOSCOPY WITH PROPOFOL;  Surgeon: Carol Ada, MD;  Location: [facility];  Service: Endoscopy;  Laterality: N/A;  . ESOPHAGEAL DILATION    . KNEE ARTHROSCOPY     L KNEE  . LUMBAR LAMINECTOMY    . LUMBAR LAMINECTOMY/DECOMPRESSION MICRODISCECTOMY  02/25/2011   Procedure: LUMBAR LAMINECTOMY/DECOMPRESSION MICRODISCECTOMY;  Surgeon: Floyce Stakes;  Location: [place] [facility];  Service: Neurosurgery;  Laterality: N/A;  Lumbar Four-Five Microdiscectomy  .  LUMBAR WOUND DEBRIDEMENT  03/18/2011   Procedure: LUMBAR WOUND DEBRIDEMENT;  Surgeon: Floyce Stakes;  Location: [place] [facility];  Service: Neurosurgery;  Laterality: N/A;  Exploration of Lumbar wound  . POLYPECTOMY  10/06/2017   Procedure: POLYPECTOMY;  Surgeon: Carol Ada, MD;  Location: [facility];  Service: Endoscopy;;  . SEPTOPLASTY  10+yrs ago  . SPINAL CORD STIMULATOR INSERTION N/A 12/11/2015   Procedure: LUMBAR SPINAL CORD STIMULATOR INSERTION;  Surgeon: Clydell Hakim, MD;  Location: [place] [facility];  Service: Neurosurgery;  Laterality: N/A;    Family History  Problem Relation Age of Onset  . Arthritis Mother   . Hypertension Mother   . Kidney disease Mother   . COPD Mother   . Heart disease Mother 12       CAD  . Diabetes Mother   . Mental illness Mother   . Arthritis Father   . Diabetes Father   . Heart disease Father   . Cancer Father        prostate cancer  . Mental  illness Father        Alzheimers Dementia  . Cancer Maternal Grandmother        brain tumor  . Cancer Maternal Grandfather        lung  . Diabetes Maternal Grandfather   . Early death Brother   . Anesthesia problems Neg Hx   . Hypotension Neg Hx   . Malignant hyperthermia Neg Hx   . Pseudochol deficiency Neg Hx     Social History   Socioeconomic History  . Marital status: Married    Spouse name: Not on file  . Number of children: Not on file  . Years of education: Not on file  . Highest education level: Not on file  Occupational History  . Not on file  Tobacco Use  . Smoking status: Never Smoker  . Smokeless tobacco: Never Used  Vaping Use  . Vaping Use: Never used  Substance and Sexual Activity  . Alcohol use: No  . Drug use: No  . Sexual activity: Not on file  Other Topics Concern  . Not on file  Social History Narrative  . Not on file   Social Determinants of Health   Financial Resource Strain: Not on file  Food Insecurity: Not on file  Transportation Needs: Not on file  Physical Activity: Not on file  Stress: Not on file  Social Connections: Not on file  Intimate Partner Violence: Not on file    Outpatient Medications Prior to Visit  Medication Sig Dispense Refill  . albuterol (VENTOLIN HFA) 108 (90 Base) MCG/ACT inhaler     . ALPRAZolam (XANAX) 1 MG tablet Take 1 tablet (1 mg total) by mouth 3 (three) times daily as needed for anxiety. 30 tablet 0  . azelastine (ASTELIN) 0.1 % nasal spray Place 1 spray into both nostrils 2 (two) times daily. Use in each nostril as directed    . budesonide-formoterol (SYMBICORT) 160-4.5 MCG/ACT inhaler Inhale 2 puffs into the lungs 2 (two) times daily.     Marland Kitchen buPROPion (WELLBUTRIN XL) 150 MG 24 hr tablet TAKE 3 TABLETS BY MOUTH DAILY (NEED APPOINTMENT AND LABWORK FOR MORE REFILLS) 270 tablet 0  . Calcium Carbonate-Vitamin D (CALCIUM-VITAMIN D3 PO) Take 1 tablet by mouth daily.    . cyanocobalamin (,VITAMIN B-12,) 1000  MCG/ML injection Inject 1 mL (1,000 mcg total) into the muscle every 30 (thirty) days. 10 mL 1  . diphenhydrAMINE (BENADRYL) 50 MG capsule Take 50 mg  by mouth daily.    Marland Kitchen EPINEPHrine 0.3 mg/0.3 mL IJ SOAJ injection Inject 0.3 mg into the muscle once.     . Estradiol 10 MCG TABS vaginal tablet Place 1 tablet (10 mcg total) vaginally 2 (two) times a week. 24 tablet 3  . fexofenadine (ALLEGRA) 180 MG tablet Take 180 mg by mouth daily.    . fluticasone (FLONASE) 50 MCG/ACT nasal spray Place 2 sprays into both nostrils 2 (two) times daily.    . hydrochlorothiazide (HYDRODIURIL) 25 MG tablet TAKE 1 TABLET EVERY DAY. NEED PHYSICAL WITH LABS FOR REFILLS [phone] 90 tablet 0  . levETIRAcetam (KEPPRA) 500 MG tablet Take 1,000 mg by mouth at bedtime.     Marland Kitchen linaclotide (LINZESS) 290 MCG CAPS capsule Take 580 mcg by mouth 2 (two) times daily.     . montelukast (SINGULAIR) 10 MG tablet Take 1 tablet (10 mg total) by mouth at bedtime. 90 tablet 3  . NEXIUM 40 MG capsule TAKE 1 CAPSULE TWICE DAILY. NEED A PHYSICAL WITH LABS FOR MORE REFILLS [phone]. 180 capsule 0  . nystatin (MYCOSTATIN/NYSTOP) powder Apply topically 4 (four) times daily. 30 g 1  . nystatin ointment (MYCOSTATIN) APPLY 1 APPLICATION TOPICALLY 2 (TWO) TIMES DAILY AS NEEDED 30 g 0  . PILOCARPINE HCL PO Take 5 mg by mouth daily.    . potassium chloride (KLOR-CON 10) 10 MEQ tablet Take 1 tablet (10 mEq total) by mouth daily. 90 tablet 3  . PRESCRIPTION MEDICATION Inject 1 each into the muscle 2 (two) times a week. Allergy shot    . topiramate (TOPAMAX) 100 MG tablet Take 200 mg by mouth at bedtime.     . valACYclovir (VALTREX) 500 MG tablet TAKE 1 TABLET DAILY. MAY TAKE ADDITIONAL 1 TABLET IF NEEDED  FOR OUTBREAK. NEED PHYSICAL WITH LABS FOR REFILLS [phone] 100 tablet 0  . VIIBRYD 40 MG TABS TAKE 1 TABLET EVERY DAY 90 tablet 0  . cyclobenzaprine (FLEXERIL) 10 MG tablet Take 1 tablet (10 mg total) by mouth 2 (two) times daily as needed  for muscle spasms. 20 tablet 0   No facility-administered medications prior to visit.    Allergies  Allergen Reactions  . Other Other (See Comments)    Nylon sutures had to remove to allow healing  Vicryl sutures - do not use  . Percocet [Oxycodone-Acetaminophen] Other (See Comments)    "makes her crazy"  . Amoxicillin-Pot Clavulanate Other (See Comments)  . Codeine Hives  . Dye Fdc Red [Red Dye] Hives and Other (See Comments)    Scan dye, broke out in hives all over once with a neck scan  . Morphine And Related Hives and Itching  . Vesicare [Solifenacin Succinate] Itching    ROS Review of Systems  Constitutional: Negative for appetite change, chills, fever and unexpected weight change.  Respiratory: Positive for cough. Negative for shortness of breath.   Cardiovascular: Negative for chest pain.  Gastrointestinal: Negative for abdominal pain.  Genitourinary: Negative for dysuria.  Neurological: Negative for dizziness and weakness.  Hematological: Negative for adenopathy. Does not bruise/bleed easily.      Objective:    Physical Exam Vitals reviewed.  Cardiovascular:     Rate and Rhythm: Normal rate and regular rhythm.  Pulmonary:     Effort: Pulmonary effort is normal.     Breath sounds: Normal breath sounds.  Musculoskeletal:     Comments: No localized back tenderness.  No spinal tenderness.  Neurological:  Mental Status: She is alert.     BP 120/74   Pulse (!) 110   Ht 5\' 6"  (1.676 m)   Wt 290 lb (131.5 kg)   SpO2 97%   BMI 46.81 kg/m  Wt Readings from Last 3 Encounters:  03/27/20 290 lb (131.5 kg)  02/28/20 286 lb 6.4 oz (129.9 kg)  09/17/19 280 lb 3.2 oz (127.1 kg)     Health Maintenance Due  Topic Date Due  . Hepatitis C Screening  Never done  . HIV Screening  Never done  . INFLUENZA VACCINE  10/20/2019  . MAMMOGRAM  11/16/2019  . COVID-19 Vaccine (3 - Booster for Moderna series) 12/29/2019    There are no preventive care reminders to  display for this patient.  Lab Results  Component Value Date   TSH 3.670 12/24/2018   Lab Results  Component Value Date   WBC 9.4 09/10/2019   HGB 13.3 09/10/2019   HCT 41.5 09/10/2019   MCV 96.3 09/10/2019   PLT 265 09/10/2019   Lab Results  Component Value Date   NA 142 03/27/2020   K 4.0 03/27/2020   CO2 26 03/27/2020   GLUCOSE 93 03/27/2020   BUN 12 03/27/2020   CREATININE 1.22 (H) 03/27/2020   BILITOT 0.3 12/24/2018   ALKPHOS 103 12/24/2018   AST 40 12/24/2018   ALT 31 12/24/2018   PROT 6.9 12/24/2018   ALBUMIN 4.2 12/24/2018   CALCIUM 9.7 03/27/2020   ANIONGAP 13 09/10/2019   GFR 48.26 (L) 03/27/2020   Lab Results  Component Value Date   CHOL 189 12/24/2018   Lab Results  Component Value Date   HDL 45 12/24/2018   Lab Results  Component Value Date   LDLCALC 111 (H) 12/24/2018   Lab Results  Component Value Date   TRIG 191 (H) 12/24/2018   Lab Results  Component Value Date   CHOLHDL 4.2 12/24/2018   Lab Results  Component Value Date   HGBA1C 5.8 (H) 12/24/2018      Assessment & Plan:   #1 intermittent right thoracic back pain.  Question muscle spasm.  Not clear if this is muscle cramp versus possible muscle spasm but this does sound more muscular.  -Consider trial of tizanidine 2 mg every 8 hours as needed.  She is aware this medication can be sedating.   -Check basic metabolic panel to rule out any hypokalemia  #2 hypertension stable and at goal -Continue HCTZ  #3 persistent cough for several weeks now.  No obvious GERD, wheezing, postnasal drip.  No red flags such as appetite change or weight loss -Obtain chest x-ray given duration  Meds ordered this encounter  Medications  . tiZANidine (ZANAFLEX) 2 MG tablet    Sig: Take 1 tablet (2 mg total) by mouth every 8 (eight) hours as needed for muscle spasms.    Dispense:  30 tablet    Refill:  0    Follow-up: No follow-ups on file.    Carolann Littler, MD

## 2020-03-30 NOTE — Progress Notes (Signed)
Mychart message sent: Magnesium normal.  Potassium normal.  Kidney function stable.

## 2020-03-30 NOTE — Progress Notes (Signed)
Mychart message sent: Mild bronchitic changes but no pneumonia.   No lung mass.

## 2020-04-20 ENCOUNTER — Ambulatory Visit: Payer: Medicare PPO

## 2020-04-20 DIAGNOSIS — R053 Chronic cough: Secondary | ICD-10-CM | POA: Diagnosis not present

## 2020-04-20 DIAGNOSIS — Z9689 Presence of other specified functional implants: Secondary | ICD-10-CM | POA: Diagnosis not present

## 2020-04-20 DIAGNOSIS — M5412 Radiculopathy, cervical region: Secondary | ICD-10-CM | POA: Diagnosis not present

## 2020-04-24 ENCOUNTER — Ambulatory Visit: Payer: Medicare PPO | Admitting: Pulmonary Disease

## 2020-04-24 ENCOUNTER — Other Ambulatory Visit: Payer: Self-pay

## 2020-04-24 ENCOUNTER — Encounter: Payer: Self-pay | Admitting: Pulmonary Disease

## 2020-04-24 VITALS — BP 128/62 | HR 117 | Temp 97.3°F | Ht 65.0 in | Wt 287.6 lb

## 2020-04-24 DIAGNOSIS — R059 Cough, unspecified: Secondary | ICD-10-CM | POA: Diagnosis not present

## 2020-04-24 DIAGNOSIS — R1310 Dysphagia, unspecified: Secondary | ICD-10-CM | POA: Diagnosis not present

## 2020-04-24 MED ORDER — FAMOTIDINE 20 MG PO TABS: 20.0000 mg | ORAL_TABLET | Freq: Every day | ORAL | 0 refills | Status: DC

## 2020-04-24 MED ORDER — IPRATROPIUM BROMIDE 0.03 % NA SOLN: 2.0000 | Freq: Two times a day (BID) | NASAL | 12 refills | Status: DC

## 2020-04-24 MED ORDER — FLUTICASONE PROPIONATE 50 MCG/ACT NA SUSP
1.0000 | Freq: Every day | NASAL | 6 refills | Status: DC
Start: 2020-04-24 — End: 2020-12-15

## 2020-04-24 NOTE — Patient Instructions (Addendum)
Start taking famotidine 20mg at bedtime   Elevate the head of your bed about 4-6 inches with blocks  Start fluticasone 1 spray per nostril daily  Start ipratropium nasal spray, 1 spray per nostril twice daily  Can do daily sinus rinses with [facility] Med solution kit.   We will schedule you for barium swallow  

## 2020-04-24 NOTE — Progress Notes (Signed)
Synopsis: Referred in January 2020 by [name], PA for Chronic Cough  Subjective:   PATIENT ID: Taylor Ritter GENDER: female DOB: May 15, 1960, MRN: [id number]   HPI  Chief Complaint  Patient presents with  . Consult    More SOB, cough with brown/yellow mucous, wheezing with chest/back pain   Taylor Ritter is a 60 year old woman, never smoker with history of esophageal stricture, GERD, seasonal allergies and chronic neck/back pain who is referred to pulmonary clinic for chronic cough.   She report she has been having cough and shortness of breath over the past 3 months. The cough has been dry but has recently changed to a productive cough with brown/yellowish sputum. She reports she does have wheezing as well. She has been having chest and back pain due to the excessive coughing. She is followed by [facility] who referred her for evaluation of the cough as it has been hindering her control of pain.   She has been on prednisone and antibiotics for the cough without much relief. She does complain of sinus congestion and drainage. She also has terrible reflux symptoms. She does wake up at night from her reflux symptoms. She also complains of trouble swallowing her food.   Past Medical History:  Diagnosis Date  . Allergy    takes Allegra daily and uses nasal spray daily.Takes Benadryl as needed.Uses eye drops daily  . Anemia    B 12 shots monthly  . Anxiety    takes Xanax as needed  . Arthritis    osteoporosis  . Asthma    Proventil inhaler as needed  . Chronic back pain    DDD and stenosis  . Complication of anesthesia    pt states she was woke up during surgery  . Depression    takes Wellbutrin daily  . Fibromyalgia   . GERD (gastroesophageal reflux disease)    takes Nexium daily  . Headache(784.0)    takes Topamax daily as needed  . History of bronchitis    2016  . History of colon polyps    benign  . History of vertigo    took Meclizine  several yrs ago  . Hypertension    followed by [facility]  . IBS (irritable bowel syndrome)    takes LInzess daily  . Insomnia   . Joint pain   . Muscle spasm    takes Flexeril daily as needed  . Neuromuscular disorder (HCC)    numbness arms legs,feet   . Nocturia   . Osteoporosis   . Pain    takes Keppra and Topamax daily  . Peripheral edema    takes HCTZ daily  . Sleep apnea    has a CPAP but doesn't wear often  . Urinary frequency   . Urinary incontinence   . Urinary urgency      Family History  Problem Relation Age of Onset  . Arthritis Mother   . Hypertension Mother   . Kidney disease Mother   . COPD Mother   . Heart disease Mother 65       CAD  . Diabetes Mother   . Mental illness Mother   . Arthritis Father   . Diabetes Father   . Heart disease Father   . Cancer Father        prostate cancer  . Mental illness Father        Alzheimers Dementia  . Cancer Maternal Grandmother  brain tumor  . Cancer Maternal Grandfather        lung  . Diabetes Maternal Grandfather   . Early death Brother   . Anesthesia problems Neg Hx   . Hypotension Neg Hx   . Malignant hyperthermia Neg Hx   . Pseudochol deficiency Neg Hx      Social History   Socioeconomic History  . Marital status: Married    Spouse name: Not on file  . Number of children: Not on file  . Years of education: Not on file  . Highest education level: Not on file  Occupational History  . Not on file  Tobacco Use  . Smoking status: Never Smoker  . Smokeless tobacco: Never Used  Vaping Use  . Vaping Use: Never used  Substance and Sexual Activity  . Alcohol use: No  . Drug use: No  . Sexual activity: Not on file  Other Topics Concern  . Not on file  Social History Narrative  . Not on file   Social Determinants of Health   Financial Resource Strain: Not on file  Food Insecurity: Not on file  Transportation Needs: Not on file  Physical Activity: Not on file   Stress: Not on file  Social Connections: Not on file  Intimate Partner Violence: Not on file     Allergies  Allergen Reactions  . Other Other (See Comments)    Nylon sutures had to remove to allow healing  Vicryl sutures - do not use  . Percocet [Oxycodone-Acetaminophen] Other (See Comments)    "makes her crazy"  . Amoxicillin-Pot Clavulanate Other (See Comments)  . Codeine Hives  . Dye Fdc Red [Red Dye] Hives and Other (See Comments)    Scan dye, broke out in hives all over once with a neck scan  . Morphine And Related Hives and Itching  . Vesicare [Solifenacin Succinate] Itching     Outpatient Medications Prior to Visit  Medication Sig Dispense Refill  . albuterol (VENTOLIN HFA) 108 (90 Base) MCG/ACT inhaler     . ALPRAZolam (XANAX) 1 MG tablet Take 1 tablet (1 mg total) by mouth 3 (three) times daily as needed for anxiety. 30 tablet 0  . azelastine (ASTELIN) 0.1 % nasal spray Place 1 spray into both nostrils 2 (two) times daily. Use in each nostril as directed    . budesonide-formoterol (SYMBICORT) 160-4.5 MCG/ACT inhaler Inhale 2 puffs into the lungs 2 (two) times daily.     Marland Kitchen buPROPion (WELLBUTRIN XL) 150 MG 24 hr tablet TAKE 3 TABLETS BY MOUTH DAILY (NEED APPOINTMENT AND LABWORK FOR MORE REFILLS) 270 tablet 0  . Calcium Carbonate-Vitamin D (CALCIUM-VITAMIN D3 PO) Take 1 tablet by mouth daily.    . cyanocobalamin (,VITAMIN B-12,) 1000 MCG/ML injection Inject 1 mL (1,000 mcg total) into the muscle every 30 (thirty) days. 10 mL 1  . diphenhydrAMINE (BENADRYL) 50 MG capsule Take 50 mg by mouth daily.    Marland Kitchen EPINEPHrine 0.3 mg/0.3 mL IJ SOAJ injection Inject 0.3 mg into the muscle once.     . Estradiol 10 MCG TABS vaginal tablet Place 1 tablet (10 mcg total) vaginally 2 (two) times a week. 24 tablet 3  . fexofenadine (ALLEGRA) 180 MG tablet Take 180 mg by mouth daily.    . hydrochlorothiazide (HYDRODIURIL) 25 MG tablet TAKE 1 TABLET EVERY DAY. NEED PHYSICAL WITH LABS FOR REFILLS  [phone] 90 tablet 0  . levETIRAcetam (KEPPRA) 500 MG tablet Take 1,000 mg by mouth at bedtime.     Marland Kitchen  linaclotide (LINZESS) 290 MCG CAPS capsule Take 580 mcg by mouth 2 (two) times daily.     . montelukast (SINGULAIR) 10 MG tablet Take 1 tablet (10 mg total) by mouth at bedtime. 90 tablet 3  . NEXIUM 40 MG capsule TAKE 1 CAPSULE TWICE DAILY. NEED A PHYSICAL WITH LABS FOR MORE REFILLS [phone]. 180 capsule 0  . nystatin (MYCOSTATIN/NYSTOP) powder Apply topically 4 (four) times daily. 30 g 1  . nystatin ointment (MYCOSTATIN) APPLY 1 APPLICATION TOPICALLY 2 (TWO) TIMES DAILY AS NEEDED 30 g 0  . PILOCARPINE HCL PO Take 5 mg by mouth daily.    . potassium chloride (KLOR-CON 10) 10 MEQ tablet Take 1 tablet (10 mEq total) by mouth daily. 90 tablet 3  . PRESCRIPTION MEDICATION Inject 1 each into the muscle 2 (two) times a week. Allergy shot    . tiZANidine (ZANAFLEX) 2 MG tablet Take 1 tablet (2 mg total) by mouth every 8 (eight) hours as needed for muscle spasms. 30 tablet 0  . topiramate (TOPAMAX) 100 MG tablet Take 200 mg by mouth at bedtime.     . valACYclovir (VALTREX) 500 MG tablet TAKE 1 TABLET DAILY. MAY TAKE ADDITIONAL 1 TABLET IF NEEDED  FOR OUTBREAK. NEED PHYSICAL WITH LABS FOR REFILLS [phone] 100 tablet 0  . VIIBRYD 40 MG TABS TAKE 1 TABLET EVERY DAY 90 tablet 0  . fluticasone (FLONASE) 50 MCG/ACT nasal spray Place 2 sprays into both nostrils 2 (two) times daily.     No facility-administered medications prior to visit.    Review of Systems  Constitutional: Negative for chills, fever, malaise/fatigue and weight loss.  HENT: Positive for congestion. Negative for sinus pain and sore throat.   Eyes: Negative.   Respiratory: Positive for cough, sputum production, shortness of breath and wheezing. Negative for hemoptysis.   Cardiovascular: Positive for chest pain. Negative for palpitations, orthopnea, claudication and leg swelling.  Gastrointestinal: Positive for heartburn.  Negative for abdominal pain, nausea and vomiting.       Dysphagia  Genitourinary: Negative.   Musculoskeletal: Positive for joint pain. Negative for myalgias.  Skin: Negative for rash.  Neurological: Positive for headaches. Negative for weakness.  Endo/Heme/Allergies: Negative.   Psychiatric/Behavioral: Positive for depression.    Objective:   Vitals:   04/24/20 1550  BP: 128/62  Pulse: (!) 117  Temp: (!) 97.3 F (36.3 C)  TempSrc: Oral  SpO2: 97%  Weight: 287 lb 9.6 oz (130.5 kg)  Height: $Remove'5\' 5"'MDvgWyj$  (1.651 m)     Physical Exam Constitutional:      General: She is not in acute distress.    Appearance: She is obese. She is not ill-appearing.     Comments: Coughing throughout exam  HENT:     Head: Normocephalic and atraumatic.     Nose: Congestion present.     Comments: Erythema present    Mouth/Throat:     Mouth: Mucous membranes are moist.     Pharynx: Oropharynx is clear.  Eyes:     General: No scleral icterus.    Conjunctiva/sclera: Conjunctivae normal.     Pupils: Pupils are equal, round, and reactive to light.  Cardiovascular:     Rate and Rhythm: Normal rate and regular rhythm.     Pulses: Normal pulses.     Heart sounds: Normal heart sounds. No murmur heard.   Pulmonary:     Effort: Pulmonary effort is normal.     Breath sounds: Normal breath sounds. No wheezing, rhonchi or  rales.  Abdominal:     General: Bowel sounds are normal.     Palpations: Abdomen is soft.  Musculoskeletal:     Right lower leg: No edema.     Left lower leg: No edema.  Lymphadenopathy:     Cervical: No cervical adenopathy.  Skin:    General: Skin is warm and dry.  Neurological:     General: No focal deficit present.     Mental Status: She is alert.  Psychiatric:        Mood and Affect: Mood normal.        Behavior: Behavior normal.        Thought Content: Thought content normal.        Judgment: Judgment normal.    CBC    Component Value Date/Time   WBC 9.4 09/10/2019  1505   RBC 4.31 09/10/2019 1505   HGB 13.3 09/10/2019 1505   HGB 12.4 12/24/2018 1422   HCT 41.5 09/10/2019 1505   HCT 38.0 12/24/2018 1422   PLT 265 09/10/2019 1505   PLT 258 12/24/2018 1422   MCV 96.3 09/10/2019 1505   MCV 93 12/24/2018 1422   MCH 30.9 09/10/2019 1505   MCHC 32.0 09/10/2019 1505   RDW 12.9 09/10/2019 1505   RDW 12.4 12/24/2018 1422   LYMPHSABS 2.2 04/07/2017 1109   MONOABS 0.4 04/07/2017 1109   EOSABS 0.2 04/07/2017 1109   BASOSABS 0.1 04/07/2017 1109   BMP Latest Ref Rng & Units 03/27/2020 09/10/2019 12/24/2018  Glucose 70 - 99 mg/dL 93 122(H) 96  BUN 6 - 23 mg/dL $Remove'12 12 14  'kcxbPSe$ Creatinine 0.40 - 1.20 mg/dL 1.22(H) 1.33(H) 1.13(H)  BUN/Creat Ratio 9 - 23 - - 12  Sodium 135 - 145 mEq/L 142 140 143  Potassium 3.5 - 5.1 mEq/L 4.0 3.6 3.4(L)  Chloride 96 - 112 mEq/L 107 103 107(H)  CO2 19 - 32 mEq/L $Remove'26 24 22  'gmUmaXC$ Calcium 8.4 - 10.5 mg/dL 9.7 9.0 8.9   Chest imaging: CXR 03/27/2020 Mild bronchitic changes without infiltrate.  PFT: No flowsheet data found.   Assessment & Plan:   Cough  Dysphagia, unspecified type - Plan: SLP modified barium swallow  Discussion: Taylor Ritter is a 60 year old woman, never smoker with history of esophageal stricture, GERD, seasonal allergies and chronic neck/back pain who is referred to pulmonary clinic for chronic cough.   Her cough appears to be related to post-nasal drainage and uncontrolled GERD.   She is to start flonase nasal spray daily along with ipratropium nasal spray twice daily. She should also include sinus rinses daily over the next two weeks with [employer].   For her GERD, she is to start taking famotidine $RemoveBeforeDEI'20mg'qWYsOXhVolMxuIqX$  at bedtime. She is to continue nexium daily. She should also elevate the head of her bed 4-6 inches at night to reduce nocturnal reflux symptoms. We discussed diet modifications as well.   She does complain of dysphagia and after reviewing her CT neck there does appear to be some anterior cervical hardware  that abuts the esophagus. We will check a modified barium swallow for further evaluation.   She does not appear to have reactive airways disease at this time but she can continue as needed albuterol inhaler for possible relief.   >60 minutes was spent reviewing records, direct patient contact and completing documentation.  Freda Jackson, MD [facility] Office: [phone]   See Amion for Pager Details      Current Outpatient Medications:  .  albuterol (VENTOLIN HFA) 108 (90 Base) MCG/ACT inhaler, , Disp: , Rfl:  .  ALPRAZolam (XANAX) 1 MG tablet, Take 1 tablet (1 mg total) by mouth 3 (three) times daily as needed for anxiety., Disp: 30 tablet, Rfl: 0 .  azelastine (ASTELIN) 0.1 % nasal spray, Place 1 spray into both nostrils 2 (two) times daily. Use in each nostril as directed, Disp: , Rfl:  .  budesonide-formoterol (SYMBICORT) 160-4.5 MCG/ACT inhaler, Inhale 2 puffs into the lungs 2 (two) times daily. , Disp: , Rfl:  .  buPROPion (WELLBUTRIN XL) 150 MG 24 hr tablet, TAKE 3 TABLETS BY MOUTH DAILY (NEED APPOINTMENT AND LABWORK FOR MORE REFILLS), Disp: 270 tablet, Rfl: 0 .  Calcium Carbonate-Vitamin D (CALCIUM-VITAMIN D3 PO), Take 1 tablet by mouth daily., Disp: , Rfl:  .  cyanocobalamin (,VITAMIN B-12,) 1000 MCG/ML injection, Inject 1 mL (1,000 mcg total) into the muscle every 30 (thirty) days., Disp: 10 mL, Rfl: 1 .  diphenhydrAMINE (BENADRYL) 50 MG capsule, Take 50 mg by mouth daily., Disp: , Rfl:  .  EPINEPHrine 0.3 mg/0.3 mL IJ SOAJ injection, Inject 0.3 mg into the muscle once. , Disp: , Rfl:  .  Estradiol 10 MCG TABS vaginal tablet, Place 1 tablet (10 mcg total) vaginally 2 (two) times a week., Disp: 24 tablet, Rfl: 3 .  famotidine (PEPCID) 20 MG tablet, Take 1 tablet (20 mg total) by mouth at bedtime., Disp: 30 tablet, Rfl: 0 .  fexofenadine (ALLEGRA) 180 MG tablet, Take 180 mg by mouth daily., Disp: , Rfl:  .  hydrochlorothiazide (HYDRODIURIL) 25 MG  tablet, TAKE 1 TABLET EVERY DAY. NEED PHYSICAL WITH LABS FOR REFILLS [phone], Disp: 90 tablet, Rfl: 0 .  ipratropium (ATROVENT) 0.03 % nasal spray, Place 2 sprays into both nostrils every 12 (twelve) hours., Disp: 30 mL, Rfl: 12 .  levETIRAcetam (KEPPRA) 500 MG tablet, Take 1,000 mg by mouth at bedtime. , Disp: , Rfl:  .  linaclotide (LINZESS) 290 MCG CAPS capsule, Take 580 mcg by mouth 2 (two) times daily. , Disp: , Rfl:  .  montelukast (SINGULAIR) 10 MG tablet, Take 1 tablet (10 mg total) by mouth at bedtime., Disp: 90 tablet, Rfl: 3 .  NEXIUM 40 MG capsule, TAKE 1 CAPSULE TWICE DAILY. NEED A PHYSICAL WITH LABS FOR MORE REFILLS [phone]., Disp: 180 capsule, Rfl: 0 .  nystatin (MYCOSTATIN/NYSTOP) powder, Apply topically 4 (four) times daily., Disp: 30 g, Rfl: 1 .  nystatin ointment (MYCOSTATIN), APPLY 1 APPLICATION TOPICALLY 2 (TWO) TIMES DAILY AS NEEDED, Disp: 30 g, Rfl: 0 .  PILOCARPINE HCL PO, Take 5 mg by mouth daily., Disp: , Rfl:  .  potassium chloride (KLOR-CON 10) 10 MEQ tablet, Take 1 tablet (10 mEq total) by mouth daily., Disp: 90 tablet, Rfl: 3 .  PRESCRIPTION MEDICATION, Inject 1 each into the muscle 2 (two) times a week. Allergy shot, Disp: , Rfl:  .  tiZANidine (ZANAFLEX) 2 MG tablet, Take 1 tablet (2 mg total) by mouth every 8 (eight) hours as needed for muscle spasms., Disp: 30 tablet, Rfl: 0 .  topiramate (TOPAMAX) 100 MG tablet, Take 200 mg by mouth at bedtime. , Disp: , Rfl:  .  valACYclovir (VALTREX) 500 MG tablet, TAKE 1 TABLET DAILY. MAY TAKE ADDITIONAL 1 TABLET IF NEEDED  FOR OUTBREAK. NEED PHYSICAL WITH LABS FOR REFILLS [phone], Disp: 100 tablet, Rfl: 0 .  VIIBRYD 40 MG TABS, TAKE 1 TABLET EVERY DAY, Disp: 90 tablet, Rfl: 0 .  fluticasone (FLONASE) 50 MCG/ACT nasal spray, Place 1 spray into both nostrils daily., Disp: 11.1 mL, Rfl: 6

## 2020-04-27 ENCOUNTER — Other Ambulatory Visit (HOSPITAL_COMMUNITY): Payer: Self-pay

## 2020-04-27 DIAGNOSIS — R1310 Dysphagia, unspecified: Secondary | ICD-10-CM

## 2020-04-28 ENCOUNTER — Encounter: Payer: Self-pay | Admitting: Pulmonary Disease

## 2020-05-04 ENCOUNTER — Other Ambulatory Visit: Payer: Self-pay

## 2020-05-04 ENCOUNTER — Ambulatory Visit (HOSPITAL_COMMUNITY)
Admission: RE | Admit: 2020-05-04 | Discharge: 2020-05-04 | Disposition: A | Discharge status: Home / Self Care | Payer: Medicare PPO | Source: Ambulatory Visit | Attending: Pulmonary Disease | Admitting: Pulmonary Disease

## 2020-05-04 DIAGNOSIS — R1310 Dysphagia, unspecified: Secondary | ICD-10-CM | POA: Diagnosis not present

## 2020-05-04 DIAGNOSIS — R059 Cough, unspecified: Secondary | ICD-10-CM | POA: Diagnosis not present

## 2020-05-15 ENCOUNTER — Telehealth: Payer: Self-pay

## 2020-05-15 MED ORDER — NEXIUM 40 MG PO CPDR: DELAYED_RELEASE_CAPSULE | ORAL | 3 refills | Status: DC

## 2020-05-15 NOTE — Telephone Encounter (Signed)
Spoke with patient regarding Nexium.  Patient says she has gotten this approved through insurance and needs a refill sent.  Refill sent.

## 2020-05-15 NOTE — Telephone Encounter (Signed)
Patients insurance no longer covers Nexium.  Alternatives include prevacid, Prilosec or rabeprazole.  If none are appropriate prior auth will need initiated.  Please advise.

## 2020-05-15 NOTE — Telephone Encounter (Signed)
I would call pt to let her know the Nexium may not be covered and to see if she has tried any of there listed alternatives.

## 2020-05-25 DIAGNOSIS — K219 Gastro-esophageal reflux disease without esophagitis: Secondary | ICD-10-CM | POA: Diagnosis not present

## 2020-05-25 DIAGNOSIS — K5900 Constipation, unspecified: Secondary | ICD-10-CM | POA: Diagnosis not present

## 2020-05-25 DIAGNOSIS — Z86010 Personal history of colonic polyps: Secondary | ICD-10-CM | POA: Diagnosis not present

## 2020-05-25 DIAGNOSIS — R635 Abnormal weight gain: Secondary | ICD-10-CM | POA: Diagnosis not present

## 2020-05-26 ENCOUNTER — Ambulatory Visit (INDEPENDENT_AMBULATORY_CARE_PROVIDER_SITE_OTHER): Payer: Medicare PPO

## 2020-05-26 DIAGNOSIS — Z0000 Encounter for general adult medical examination without abnormal findings: Secondary | ICD-10-CM | POA: Diagnosis not present

## 2020-05-26 NOTE — Patient Instructions (Signed)
Taylor Ritter , Thank you for taking time to come for your Medicare Wellness Visit. I appreciate your ongoing commitment to your health goals. Please review the following plan we discussed and let me know if I can assist you in the future.   Screening recommendations/referrals: Colonoscopy: Up to date, next due 03/10/2023 Mammogram: Currently due please keep upcoming appointment on 06/04/2020  Bone Density: Not due until age 61  Recommended yearly ophthalmology/optometry visit for glaucoma screening and checkup Recommended yearly dental visit for hygiene and checkup  Vaccinations: Influenza vaccine: Patient declined  Pneumococcal vaccine: Not due until age 69 unless instructed to do so by provider  Tdap vaccine: Up to date, next due 08/10/2022 Shingles vaccine: Currently due, if you wish to receive we recommend that you do so at your local pharmacy.     Advanced directives: Advance directive discussed with you today. Even though you declined this today please call our office should you change your mind and we can give you the proper paperwork for you to fill out.   Conditions/risks identified: None   Next appointment: None   Preventive Care 65 Years and Older, Female Preventive care refers to lifestyle choices and visits with your health care provider that can promote health and wellness. What does preventive care include?  A yearly physical exam. This is also called an annual well check.  Dental exams once or twice a year.  Routine eye exams. Ask your health care provider how often you should have your eyes checked.  Personal lifestyle choices, including:  Daily care of your teeth and gums.  Regular physical activity.  Eating a healthy diet.  Avoiding tobacco and drug use.  Limiting alcohol use.  Practicing safe sex.  Taking low-dose aspirin every day.  Taking vitamin and mineral supplements as recommended by your health care provider. What happens during an annual  well check? The services and screenings done by your health care provider during your annual well check will depend on your age, overall health, lifestyle risk factors, and family history of disease. Counseling  Your health care provider may ask you questions about your:  Alcohol use.  Tobacco use.  Drug use.  Emotional well-being.  Home and relationship well-being.  Sexual activity.  Eating habits.  History of falls.  Memory and ability to understand (cognition).  Work and work Statistician.  Reproductive health. Screening  You may have the following tests or measurements:  Height, weight, and BMI.  Blood pressure.  Lipid and cholesterol levels. These may be checked every 5 years, or more frequently if you are over 3 years old.  Skin check.  Lung cancer screening. You may have this screening every year starting at age 67 if you have a 30-pack-year history of smoking and currently smoke or have quit within the past 15 years.  Fecal occult blood test (FOBT) of the stool. You may have this test every year starting at age 18.  Flexible sigmoidoscopy or colonoscopy. You may have a sigmoidoscopy every 5 years or a colonoscopy every 10 years starting at age 61.  Hepatitis C blood test.  Hepatitis B blood test.  Sexually transmitted disease (STD) testing.  Diabetes screening. This is done by checking your blood sugar (glucose) after you have not eaten for a while (fasting). You may have this done every 1-3 years.  Bone density scan. This is done to screen for osteoporosis. You may have this done starting at age 37.  Mammogram. This may be done every 1-2 years.  Talk to your health care provider about how often you should have regular mammograms. Talk with your health care provider about your test results, treatment options, and if necessary, the need for more tests. Vaccines  Your health care provider may recommend certain vaccines, such as:  Influenza vaccine. This  is recommended every year.  Tetanus, diphtheria, and acellular pertussis (Tdap, Td) vaccine. You may need a Td booster every 10 years.  Zoster vaccine. You may need this after age 73.  Pneumococcal 13-valent conjugate (PCV13) vaccine. One dose is recommended after age 61.  Pneumococcal polysaccharide (PPSV23) vaccine. One dose is recommended after age 84. Talk to your health care provider about which screenings and vaccines you need and how often you need them. This information is not intended to replace advice given to you by your health care provider. Make sure you discuss any questions you have with your health care provider. Document Released: 04/03/2015 Document Revised: 11/25/2015 Document Reviewed: 01/06/2015 Elsevier Interactive Patient Education  2017 [place] Prevention in the Home Falls can cause injuries. They can happen to people of all ages. There are many things you can do to make your home safe and to help prevent falls. What can I do on the outside of my home?  Regularly fix the edges of walkways and driveways and fix any cracks.  Remove anything that might make you trip as you walk through a door, such as a raised step or threshold.  Trim any bushes or trees on the path to your home.  Use bright outdoor lighting.  Clear any walking paths of anything that might make someone trip, such as rocks or tools.  Regularly check to see if handrails are loose or broken. Make sure that both sides of any steps have handrails.  Any raised decks and porches should have guardrails on the edges.  Have any leaves, snow, or ice cleared regularly.  Use sand or salt on walking paths during winter.  Clean up any spills in your garage right away. This includes oil or grease spills. What can I do in the bathroom?  Use night lights.  Install grab bars by the toilet and in the tub and shower. Do not use towel bars as grab bars.  Use non-skid mats or decals in the tub or  shower.  If you need to sit down in the shower, use a plastic, non-slip stool.  Keep the floor dry. Clean up any water that spills on the floor as soon as it happens.  Remove soap buildup in the tub or shower regularly.  Attach bath mats securely with double-sided non-slip rug tape.  Do not have throw rugs and other things on the floor that can make you trip. What can I do in the bedroom?  Use night lights.  Make sure that you have a light by your bed that is easy to reach.  Do not use any sheets or blankets that are too big for your bed. They should not hang down onto the floor.  Have a firm chair that has side arms. You can use this for support while you get dressed.  Do not have throw rugs and other things on the floor that can make you trip. What can I do in the kitchen?  Clean up any spills right away.  Avoid walking on wet floors.  Keep items that you use a lot in easy-to-reach places.  If you need to reach something above you, use a strong step stool  that has a grab bar.  Keep electrical cords out of the way.  Do not use floor polish or wax that makes floors slippery. If you must use wax, use non-skid floor wax.  Do not have throw rugs and other things on the floor that can make you trip. What can I do with my stairs?  Do not leave any items on the stairs.  Make sure that there are handrails on both sides of the stairs and use them. Fix handrails that are broken or loose. Make sure that handrails are as long as the stairways.  Check any carpeting to make sure that it is firmly attached to the stairs. Fix any carpet that is loose or worn.  Avoid having throw rugs at the top or bottom of the stairs. If you do have throw rugs, attach them to the floor with carpet tape.  Make sure that you have a light switch at the top of the stairs and the bottom of the stairs. If you do not have them, ask someone to add them for you. What else can I do to help prevent  falls?  Wear shoes that:  Do not have high heels.  Have rubber bottoms.  Are comfortable and fit you well.  Are closed at the toe. Do not wear sandals.  If you use a stepladder:  Make sure that it is fully opened. Do not climb a closed stepladder.  Make sure that both sides of the stepladder are locked into place.  Ask someone to hold it for you, if possible.  Clearly mark and make sure that you can see:  Any grab bars or handrails.  First and last steps.  Where the edge of each step is.  Use tools that help you move around (mobility aids) if they are needed. These include:  Canes.  Walkers.  Scooters.  Crutches.  Turn on the lights when you go into a dark area. Replace any light bulbs as soon as they burn out.  Set up your furniture so you have a clear path. Avoid moving your furniture around.  If any of your floors are uneven, fix them.  If there are any pets around you, be aware of where they are.  Review your medicines with your doctor. Some medicines can make you feel dizzy. This can increase your chance of falling. Ask your doctor what other things that you can do to help prevent falls. This information is not intended to replace advice given to you by your health care provider. Make sure you discuss any questions you have with your health care provider. Document Released: 01/01/2009 Document Revised: 08/13/2015 Document Reviewed: 04/11/2014 Elsevier Interactive Patient Education  2017 [employer].

## 2020-05-26 NOTE — Progress Notes (Signed)
Subjective:   Taylor Ritter is a 60 y.o. female who presents for Medicare Annual (Subsequent) preventive examination.  Virtual Visit via Video Note  I connected with Taylor Ritter  by a video enabled telemedicine application and verified that I am speaking with the correct person using two identifiers.  Location: Patient: Home Provider: Office Persons participating in the virtual visit: patient, provider   I discussed the limitations of evaluation and management by telemedicine and the availability of in person appointments. The patient expressed understanding and agreed to proceed.     Taylor Ritter    Review of Systems    N/A  Cardiac Risk Factors include: hypertension;obesity (BMI >30kg/m2);sedentary lifestyle     Objective:    There were no vitals filed for this visit. There is no height or weight on file to calculate BMI.  Advanced Directives 05/26/2020 09/10/2019 10/06/2017 10/05/2017 12/11/2015 12/09/2015 11/13/2015  Does Patient Have a Medical Advance Directive? No No No No No No No  Would patient like information on creating a medical advance directive? No - Patient declined - No - Patient declined No - Patient declined No - patient declined information No - patient declined information Yes - Educational materials given  Pre-existing out of facility DNR order (yellow form or pink MOST form) - - - - - - -    Current Medications (verified) Outpatient Encounter Medications as of 05/26/2020  Medication Sig  . ALPRAZolam (XANAX) 1 MG tablet Take 1 tablet (1 mg total) by mouth 3 (three) times daily as needed for anxiety.  . budesonide-formoterol (SYMBICORT) 160-4.5 MCG/ACT inhaler Inhale 2 puffs into the lungs 2 (two) times daily.   Marland Kitchen buPROPion (WELLBUTRIN XL) 150 MG 24 hr tablet TAKE 3 TABLETS BY MOUTH DAILY (NEED APPOINTMENT AND LABWORK FOR MORE REFILLS)  . Calcium Carbonate-Vitamin D (CALCIUM-VITAMIN D3 PO) Take 1 tablet by mouth daily.  . cyanocobalamin (,VITAMIN  B-12,) 1000 MCG/ML injection Inject 1 mL (1,000 mcg total) into the muscle every 30 (thirty) days.  . diphenhydrAMINE (BENADRYL) 50 MG capsule Take 50 mg by mouth daily.  . Estradiol 10 MCG TABS vaginal tablet Place 1 tablet (10 mcg total) vaginally 2 (two) times a week.  . famotidine (PEPCID) 20 MG tablet Take 1 tablet (20 mg total) by mouth at bedtime.  . fexofenadine (ALLEGRA) 180 MG tablet Take 180 mg by mouth daily.  . fluticasone (FLONASE) 50 MCG/ACT nasal spray Place 1 spray into both nostrils daily.  . hydrochlorothiazide (HYDRODIURIL) 25 MG tablet TAKE 1 TABLET EVERY DAY. NEED PHYSICAL WITH LABS FOR REFILLS [phone]  . ipratropium (ATROVENT) 0.03 % nasal spray Place 2 sprays into both nostrils every 12 (twelve) hours.  . levETIRAcetam (KEPPRA) 500 MG tablet Take 1,000 mg by mouth at bedtime.   Marland Kitchen linaclotide (LINZESS) 290 MCG CAPS capsule Take 580 mcg by mouth 2 (two) times daily.   . montelukast (SINGULAIR) 10 MG tablet Take 1 tablet (10 mg total) by mouth at bedtime.  Marland Kitchen NEXIUM 40 MG capsule TAKE 1 CAPSULE TWICE DAILY  . nystatin (MYCOSTATIN/NYSTOP) powder Apply topically 4 (four) times daily.  Marland Kitchen nystatin ointment (MYCOSTATIN) APPLY 1 APPLICATION TOPICALLY 2 (TWO) TIMES DAILY AS NEEDED  . PILOCARPINE HCL PO Take 5 mg by mouth daily.  . potassium chloride (KLOR-CON 10) 10 MEQ tablet Take 1 tablet (10 mEq total) by mouth daily.  Marland Kitchen tiZANidine (ZANAFLEX) 2 MG tablet Take 1 tablet (2 mg total) by mouth every 8 (eight) hours as needed for muscle  spasms.  Marland Kitchen topiramate (TOPAMAX) 100 MG tablet Take 200 mg by mouth at bedtime.   . valACYclovir (VALTREX) 500 MG tablet TAKE 1 TABLET DAILY. MAY TAKE ADDITIONAL 1 TABLET IF NEEDED  FOR OUTBREAK. NEED PHYSICAL WITH LABS FOR REFILLS [phone]  . VIIBRYD 40 MG TABS TAKE 1 TABLET EVERY DAY  . albuterol (VENTOLIN HFA) 108 (90 Base) MCG/ACT inhaler  (Patient not taking: Reported on 05/26/2020)  . azelastine (ASTELIN) 0.1 % nasal spray Place 1 spray  into both nostrils 2 (two) times daily. Use in each nostril as directed (Patient not taking: Reported on 05/26/2020)  . EPINEPHrine 0.3 mg/0.3 mL IJ SOAJ injection Inject 0.3 mg into the muscle once.  (Patient not taking: Reported on 05/26/2020)  . PRESCRIPTION MEDICATION Inject 1 each into the muscle 2 (two) times a week. Allergy shot (Patient not taking: Reported on 05/26/2020)   No facility-administered encounter medications on file as of 05/26/2020.    Allergies (verified) Other, Percocet [oxycodone-acetaminophen], Amoxicillin-pot clavulanate, Codeine, Dye fdc red [red dye], Morphine and related, and Vesicare [solifenacin succinate]   History: Past Medical History:  Diagnosis Date  . Allergy    takes Allegra daily and uses nasal spray daily.Takes Benadryl as needed.Uses eye drops daily  . Anemia    B 12 shots monthly  . Anxiety    takes Xanax as needed  . Arthritis    osteoporosis  . Asthma    Proventil inhaler as needed  . Chronic back pain    DDD and stenosis  . Complication of anesthesia    pt states she was woke up during surgery  . Depression    takes Wellbutrin daily  . Fibromyalgia   . GERD (gastroesophageal reflux disease)    takes Nexium daily  . Headache(784.0)    takes Topamax daily as needed  . History of bronchitis    2016  . History of colon polyps    benign  . History of vertigo    took Meclizine several yrs ago  . Hypertension    followed by [facility]  . IBS (irritable bowel syndrome)    takes LInzess daily  . Insomnia   . Joint pain   . Muscle spasm    takes Flexeril daily as needed  . Neuromuscular disorder (HCC)    numbness arms legs,feet   . Nocturia   . Osteoporosis   . Pain    takes Keppra and Topamax daily  . Peripheral edema    takes HCTZ daily  . Sleep apnea    has a CPAP but doesn't wear often  . Urinary frequency   . Urinary incontinence   . Urinary urgency    Past Surgical History:  Procedure Laterality  Date  . ABDOMINAL HYSTERECTOMY    . ANTERIOR FUSION CERVICAL SPINE    . BACK SURGERY  2012  . bladder tacked    . CARPAL TUNNEL RELEASE     R&L  . CESAREAN SECTION  1983  . CHOLECYSTECTOMY    . COLONOSCOPY    . COLONOSCOPY WITH PROPOFOL N/A 10/06/2017   Procedure: COLONOSCOPY WITH PROPOFOL;  Surgeon: Carol Ada, MD;  Location: [facility];  Service: Endoscopy;  Laterality: N/A;  . ESOPHAGEAL DILATION    . KNEE ARTHROSCOPY     L KNEE  . LUMBAR LAMINECTOMY    . LUMBAR LAMINECTOMY/DECOMPRESSION MICRODISCECTOMY  02/25/2011   Procedure: LUMBAR LAMINECTOMY/DECOMPRESSION MICRODISCECTOMY;  Surgeon: Floyce Stakes;  Location: [place] [facility];  Service: Neurosurgery;  Laterality: N/A;  Lumbar Four-Five Microdiscectomy  . LUMBAR WOUND DEBRIDEMENT  03/18/2011   Procedure: LUMBAR WOUND DEBRIDEMENT;  Surgeon: Floyce Stakes;  Location: [place] [facility];  Service: Neurosurgery;  Laterality: N/A;  Exploration of Lumbar wound  . POLYPECTOMY  10/06/2017   Procedure: POLYPECTOMY;  Surgeon: Carol Ada, MD;  Location: [facility];  Service: Endoscopy;;  . SEPTOPLASTY  10+yrs ago  . SPINAL CORD STIMULATOR INSERTION N/A 12/11/2015   Procedure: LUMBAR SPINAL CORD STIMULATOR INSERTION;  Surgeon: Clydell Hakim, MD;  Location: [place] [facility];  Service: Neurosurgery;  Laterality: N/A;   Family History  Problem Relation Age of Onset  . Arthritis Mother   . Hypertension Mother   . Kidney disease Mother   . COPD Mother   . Heart disease Mother 26       CAD  . Diabetes Mother   . Mental illness Mother   . Arthritis Father   . Diabetes Father   . Heart disease Father   . Cancer Father        prostate cancer  . Mental illness Father        Alzheimers Dementia  . Cancer Maternal Grandmother        brain tumor  . Cancer Maternal Grandfather        lung  . Diabetes Maternal Grandfather   . Early death Brother   . Anesthesia problems Neg Hx   . Hypotension Neg Hx   . Malignant hyperthermia Neg Hx   .  Pseudochol deficiency Neg Hx    Social History   Socioeconomic History  . Marital status: Married    Spouse name: Not on file  . Number of children: Not on file  . Years of education: Not on file  . Highest education level: Not on file  Occupational History  . Not on file  Tobacco Use  . Smoking status: Never Smoker  . Smokeless tobacco: Never Used  Vaping Use  . Vaping Use: Never used  Substance and Sexual Activity  . Alcohol use: No  . Drug use: No  . Sexual activity: Not on file  Other Topics Concern  . Not on file  Social History Narrative  . Not on file   Social Determinants of Health   Financial Resource Strain: Low Risk   . Difficulty of Paying Living Expenses: Not hard at all  Food Insecurity: No Food Insecurity  . Worried About Charity fundraiser in the Last Year: Never true  . Ran Out of Food in the Last Year: Never true  Transportation Needs: No Transportation Needs  . Lack of Transportation (Medical): No  . Lack of Transportation (Non-Medical): No  Physical Activity: Inactive  . Days of Exercise per Week: 0 days  . Minutes of Exercise per Session: 0 min  Stress: No Stress Concern Present  . Feeling of Stress : Not at all  Social Connections: Moderately Isolated  . Frequency of Communication with Friends and Family: Twice a week  . Frequency of Social Gatherings with Friends and Family: Once a week  . Attends Religious Services: Never  . Active Member of Clubs or Organizations: No  . Attends Archivist Meetings: Never  . Marital Status: Married    Tobacco Counseling Counseling given: Not Answered   Clinical Intake:  Pre-visit preparation completed: Yes  Pain : No/denies pain     Nutritional Risks: None Diabetes: No  How often do you need to have someone help you when you read instructions,  pamphlets, or other written materials from your doctor or pharmacy?: 1 - Never  Diabetic?No  Interpreter Needed?: No  Information entered  by :: [place] of Daily Living In your present state of health, do you have any difficulty performing the following activities: 05/26/2020  Hearing? N  Vision? Y  Difficulty concentrating or making decisions? N  Walking or climbing stairs? Y  Comment has to go sideways on stairs, and has back pain  Dressing or bathing? N  Doing errands, shopping? N  Preparing Food and eating ? N  Using the Toilet? N  In the past six months, have you accidently leaked urine? Y  Comment has urge incontienence  Do you have problems with loss of bowel control? N  Managing your Medications? N  Managing your Finances? N  Housekeeping or managing your Housekeeping? N  Some recent data might be hidden    Patient Care Team: Eulas Post, MD as PCP - General (Family Medicine)  Indicate any recent Medical Services you may have received from other than [facility] providers in the past year (date may be approximate).     Assessment:   This is a routine wellness examination for Kaci.  Hearing/Vision screen  Hearing Screening   125Hz  250Hz  500Hz  1000Hz  2000Hz  3000Hz  4000Hz  6000Hz  8000Hz   Right ear:           Left ear:           Vision Screening Comments: Wears glasses. Gets eyes examined once per year.  Dietary issues and exercise activities discussed: Current Exercise Habits: The patient does not participate in regular exercise at present  Goals    . Exercise 150 minutes per week (moderate activity)     Both doctors she sees is promoting pool exercise; Try it!!!  [facility] does pay for silver sneaker;     . patient     To get her dogs certified as service dogs to feel safer;  Manuela Schwartz will call with more information       Depression Screen PHQ 2/9 Scores 05/26/2020 11/13/2015  PHQ - 2 Score 2 0  PHQ- 9 Score 7 -    Fall Risk Fall Risk  05/26/2020 11/13/2015  Falls in the past year? 1 Yes  Number falls in past yr: 0 2 or more  Injury with Fall? 0 Yes  Risk for fall due to : History of  fall(s) -  Follow up Falls evaluation completed;Falls prevention discussed Education provided    FALL RISK PREVENTION PERTAINING TO THE HOME:  Any stairs in or around the home? Yes  If so, are there any without handrails? No  Home free of loose throw rugs in walkways, pet beds, electrical cords, etc? Yes  Adequate lighting in your home to reduce risk of falls? Yes   ASSISTIVE DEVICES UTILIZED TO PREVENT FALLS:  Life alert? No  Use of a cane, walker or w/c? No  Grab bars in the bathroom? Yes  Shower chair or bench in shower? No  Elevated toilet seat or a handicapped toilet? No    Cognitive Function:     Normal cognitive status assessed by direct observation by this Nurse Health Advisor. No abnormalities found.      Immunizations Immunization History  Administered Date(s) Administered  . Influenza Split 03/19/2011  . Influenza,inj,Quad PF,6+ Mos 01/02/2013, 11/13/2015, 12/30/2015  . Moderna Sars-Covid-2 Vaccination 05/30/2019, 06/29/2019  . Pneumococcal Polysaccharide-23 08/02/2012  . Tdap 08/09/2012    TDAP status: Up to date  Flu Vaccine  status: Declined, Education has been provided regarding the importance of this vaccine but patient still declined. Advised may receive this vaccine at local pharmacy or Health Dept. Aware to provide a copy of the vaccination record if obtained from local pharmacy or Health Dept. Verbalized acceptance and understanding.  Pneumococcal vaccine status: Up to date  Covid-19 vaccine status: Completed vaccines  Qualifies for Shingles Vaccine? Yes   Zostavax completed No   Shingrix Completed?: No.    Education has been provided regarding the importance of this vaccine. Patient has been advised to call insurance company to determine out of pocket expense if they have not yet received this vaccine. Advised may also receive vaccine at local pharmacy or Health Dept. Verbalized acceptance and understanding.  Screening Tests Health Maintenance   Topic Date Due  . Hepatitis C Screening  Never done  . HIV Screening  Never done  . INFLUENZA VACCINE  10/20/2019  . MAMMOGRAM  11/16/2019  . COVID-19 Vaccine (3 - Booster for Moderna series) 12/29/2019  . PAP SMEAR-Modifier  04/06/2035 (Originally 08/03/2014)  . TETANUS/TDAP  08/10/2022  . COLONOSCOPY (Pts 45-60yrs Insurance coverage will need to be confirmed)  10/07/2022  . HPV VACCINES  Aged Out    Health Maintenance  Health Maintenance Due  Topic Date Due  . Hepatitis C Screening  Never done  . HIV Screening  Never done  . INFLUENZA VACCINE  10/20/2019  . MAMMOGRAM  11/16/2019  . COVID-19 Vaccine (3 - Booster for Moderna series) 12/29/2019    Colorectal cancer screening: Type of screening: Colonoscopy. Completed 10/06/2017. Repeat every 5 years  Mammogram status: Ordered 03/09/2020. Pt provided with contact info and advised to call to schedule appt.   Bone Density Status: Not due until age 28   Lung Cancer Screening: (Low Dose CT Chest recommended if Age 79-80 years, 30 pack-year currently smoking OR have quit w/in 15years.) does not qualify.   Lung Cancer Screening Referral: N/A   Additional Screening:  Hepatitis C Screening: does qualify;   Vision Screening: Recommended annual ophthalmology exams for early detection of glaucoma and other disorders of the eye. Is the patient up to date with their annual eye exam?  Yes  Who is the provider or what is the name of the office in which the patient attends annual eye exams? Dr. Alois Cliche If pt is not established with a provider, would they like to be referred to a provider to establish care? No .   Dental Screening: Recommended annual dental exams for proper oral hygiene  Community Resource Referral / Chronic Care Management: CRR required this visit?  No   CCM required this visit?  No      Plan:     I have personally reviewed and noted the following in the patient's chart:   . Medical and social history . Use  of alcohol, tobacco or illicit drugs  . Current medications and supplements . Functional ability and status . Nutritional status . Physical activity . Advanced directives . List of other physicians . Hospitalizations, surgeries, and ER visits in previous 12 months . Vitals . Screenings to include cognitive, depression, and falls . Referrals and appointments  In addition, I have reviewed and discussed with patient certain preventive protocols, quality metrics, and best practice recommendations. A written personalized care plan for preventive services as well as general preventive health recommendations were provided to patient.     Ofilia Neas, LPN   [id number]   Nurse Notes: None

## 2020-06-04 ENCOUNTER — Ambulatory Visit
Admission: RE | Admit: 2020-06-04 | Discharge: 2020-06-04 | Disposition: A | Discharge status: Home / Self Care | Payer: Medicare PPO | Source: Ambulatory Visit | Attending: Obstetrics and Gynecology | Admitting: Obstetrics and Gynecology

## 2020-06-04 ENCOUNTER — Other Ambulatory Visit: Payer: Self-pay

## 2020-06-04 DIAGNOSIS — R053 Chronic cough: Secondary | ICD-10-CM | POA: Diagnosis not present

## 2020-06-04 DIAGNOSIS — Z1231 Encounter for screening mammogram for malignant neoplasm of breast: Secondary | ICD-10-CM | POA: Diagnosis not present

## 2020-06-04 DIAGNOSIS — Z9689 Presence of other specified functional implants: Secondary | ICD-10-CM | POA: Diagnosis not present

## 2020-06-04 DIAGNOSIS — M5412 Radiculopathy, cervical region: Secondary | ICD-10-CM | POA: Diagnosis not present

## 2020-06-04 DIAGNOSIS — M4316 Spondylolisthesis, lumbar region: Secondary | ICD-10-CM | POA: Diagnosis not present

## 2020-06-14 ENCOUNTER — Other Ambulatory Visit: Payer: Self-pay | Admitting: Family Medicine

## 2020-06-24 ENCOUNTER — Ambulatory Visit: Payer: Medicare PPO | Admitting: Obstetrics and Gynecology

## 2020-07-02 ENCOUNTER — Other Ambulatory Visit: Payer: Self-pay | Admitting: Family Medicine

## 2020-07-09 ENCOUNTER — Other Ambulatory Visit: Payer: Self-pay

## 2020-07-09 ENCOUNTER — Encounter: Payer: Self-pay | Admitting: Family Medicine

## 2020-07-09 ENCOUNTER — Ambulatory Visit: Payer: Medicare PPO | Admitting: Family Medicine

## 2020-07-09 VITALS — BP 130/80 | HR 98 | Temp 98.5°F | Wt 283.0 lb

## 2020-07-09 DIAGNOSIS — M654 Radial styloid tenosynovitis [de Quervain]: Secondary | ICD-10-CM | POA: Diagnosis not present

## 2020-07-09 NOTE — Progress Notes (Signed)
   Subjective:    Patient ID: Taylor Ritter, female    DOB: Jul 24, 1960, 60 y.o.   MRN: [id number]  HPI Here for one month of pain in the left wrist, which started after she reached out to grab her grandchild to keep him from falling off a couch. There has been no swelling or redness. The pain is worst when she tries to hold up an object or to turn a door knob. She has been taking Ibuprofen with no relief.   Review of Systems  Constitutional: Negative.   Respiratory: Negative.   Cardiovascular: Negative.   Musculoskeletal: Positive for arthralgias.       Objective:   Physical Exam Constitutional:      Appearance: Normal appearance. She is not ill-appearing.  Cardiovascular:     Rate and Rhythm: Normal rate and regular rhythm.     Pulses: Normal pulses.     Heart sounds: Normal heart sounds.  Pulmonary:     Effort: Pulmonary effort is normal.     Breath sounds: Normal breath sounds.  Musculoskeletal:     Comments: The left wrist appears normal with no erythema or warmth or swelling. She is tender along the radial side of the wrist and the thumb. Extending the thumb and wrist against resistance is painful   Neurological:     Mental Status: She is alert.           Assessment & Plan:  DeQuervain's tenosynovitis. She will wear a forearm splint for several weeks. Apply ice 2-3 times a day. Apply Voltaren gel QID. Recheck if not better in 2 weeks.  Alysia Penna, MD

## 2020-07-18 ENCOUNTER — Encounter: Payer: Self-pay | Admitting: Family Medicine

## 2020-07-18 DIAGNOSIS — M659 Synovitis and tenosynovitis, unspecified: Secondary | ICD-10-CM

## 2020-07-21 NOTE — Telephone Encounter (Signed)
There is a Dr. Georgana Curio and Dr. Alphonzo Severance, ask her which she is referring to

## 2020-07-22 NOTE — Telephone Encounter (Signed)
The referral was done  

## 2020-08-03 ENCOUNTER — Ambulatory Visit: Payer: Medicare PPO | Admitting: Orthopedic Surgery

## 2020-08-05 ENCOUNTER — Ambulatory Visit: Payer: Medicare PPO | Admitting: Orthopedic Surgery

## 2020-08-05 ENCOUNTER — Ambulatory Visit: Payer: Self-pay

## 2020-08-05 ENCOUNTER — Other Ambulatory Visit: Payer: Self-pay

## 2020-08-05 DIAGNOSIS — M654 Radial styloid tenosynovitis [de Quervain]: Secondary | ICD-10-CM | POA: Diagnosis not present

## 2020-08-05 DIAGNOSIS — R519 Headache, unspecified: Secondary | ICD-10-CM | POA: Diagnosis not present

## 2020-08-08 ENCOUNTER — Encounter: Payer: Self-pay | Admitting: Orthopedic Surgery

## 2020-08-08 MED ORDER — LIDOCAINE HCL 1 % IJ SOLN
3.0000 mL | INTRAMUSCULAR | Status: AC | PRN
  Administered 2020-08-05: 3 mL

## 2020-08-08 MED ORDER — BUPIVACAINE HCL 0.5 % IJ SOLN
0.6600 mL | INTRAMUSCULAR | Status: AC | PRN
  Administered 2020-08-05: .66 mL

## 2020-08-08 NOTE — Progress Notes (Signed)
Office Visit Note   Patient: Taylor Ritter           Date of Birth: 17-Dec-1960           MRN: [id number] Visit Date: 08/05/2020 Requested by: Laurey Morale, MD [place],  [place] [id number] PCP: Eulas Post, MD  Subjective: Chief Complaint  Patient presents with  . Left Hand - Pain    HPI: Taylor Ritter is a 60 year old patient with left hand and wrist pain for 2 weeks duration.  Also reports radicular pain and a constant pain in her left arm.  She is going to see Dr. Rise Patience this afternoon due to history of cervical fusion done by Dr. Joya Salm.  Reports radicular pain with constant ache in the left arm.  The pain wakes her from sleep at night.  Also reports wrist pain primarily on the radial aspect.  She is tried a brace ice and Voltaren topically without much relief.  Denies any repetitive use injury to the left wrist              ROS: All systems reviewed are negative as they relate to the chief complaint within the history of present illness.  Patient denies  fevers or chills.   Assessment & Plan: Visit Diagnoses:  1. Tenosynovitis, de Quervain     Plan: Impression is first dorsal compartment tenosynovitis left hand and wrist in addition to likely cervical radiculopathy due to multiple fusion surgeries.  She is getting the neck and radicular component worked out today.  I think it is worth trying a dorsal compartment injection into that left wrist.  This done under ultrasound guidance today.  Continue with activity modification and conservative treatment and follow-up as needed.  Follow-Up Instructions: No follow-ups on file.   Orders:  Orders Placed This Encounter  Procedures  . US Guided Needle Placement - No Linked Charges   No orders of the defined types were placed in this encounter.     Procedures: Hand/UE Inj: L extensor compartment 1 for de Quervain's tenosynovitis on 08/05/2020 2:39 PM Indications: therapeutic Details: 25 G needle,  ultrasound-guided radial approach Medications: 3 mL lidocaine 1 %; 0.66 mL bupivacaine 0.5 % Outcome: tolerated well, no immediate complications Procedure, treatment alternatives, risks and benefits explained, specific risks discussed. Consent was given by the patient. Immediately prior to procedure a time out was called to verify the correct patient, procedure, equipment, support staff and site/side marked as required. Patient was prepped and draped in the usual sterile fashion.       Clinical Data: No additional findings.  Objective: Vital Signs: There were no vitals taken for this visit.  Physical Exam:   Constitutional: Patient appears well-developed HEENT:  Head: Normocephalic Eyes:EOM are normal Neck: Normal range of motion Cardiovascular: Normal rate Pulmonary/chest: Effort normal Neurologic: Patient is alert Skin: Skin is warm Psychiatric: Patient has normal mood and affect    Ortho Exam: Ortho exam demonstrates symmetric grip strength bilaterally with intact EPL FPL interosseous function along with wrist flexion and extension strength bilaterally.  Radial pulses intact.  Patient has tenderness over the first dorsal compartment left-hand side with positive Finkelstein's test.  EPL function is intact.  No subluxation of the tendons with wrist flexion and extension.  Specialty Comments:  No specialty comments available.  Imaging: No results found.   PMFS History: Patient Active Problem List   Diagnosis Date Noted  . Change in bowel habit 02/28/2020  . Irritable bowel syndrome with  constipation 02/28/2020  . Dysphagia 02/28/2020  . Personal history of colonic polyps 02/28/2020  . Lumbar spondylosis 09/26/2019  . Lumbar post-laminectomy syndrome 09/26/2019  . Lumbar facet joint syndrome 09/26/2019  . Degeneration of lumbar intervertebral disc 09/26/2019  . Muscle pain 09/26/2019  . Sacroiliitis, not elsewhere classified ([place]) 09/26/2019  . Recurrent cold sores  10/03/2014  . Vitamin B 12 deficiency 10/03/2014  . Severe obesity (BMI >= 40) ([place]) 08/25/2013  . Asthma 02/28/2012  . Osteoarthritis cervical spine 02/28/2012  . Depression 02/28/2012  . Hypertension 02/28/2012  . Recurrent UTI 02/28/2012  . GERD (gastroesophageal reflux disease) 02/28/2012  . Esophageal stricture 02/28/2012   Past Medical History:  Diagnosis Date  . Allergy    takes Allegra daily and uses nasal spray daily.Takes Benadryl as needed.Uses eye drops daily  . Anemia    B 12 shots monthly  . Anxiety    takes Xanax as needed  . Arthritis    osteoporosis  . Asthma    Proventil inhaler as needed  . Chronic back pain    DDD and stenosis  . Complication of anesthesia    pt states she was woke up during surgery  . Depression    takes Wellbutrin daily  . Fibromyalgia   . GERD (gastroesophageal reflux disease)    takes Nexium daily  . Headache(784.0)    takes Topamax daily as needed  . History of bronchitis    2016  . History of colon polyps    benign  . History of vertigo    took Meclizine several yrs ago  . Hypertension    followed by [facility]  . IBS (irritable bowel syndrome)    takes LInzess daily  . Insomnia   . Joint pain   . Muscle spasm    takes Flexeril daily as needed  . Neuromuscular disorder (HCC)    numbness arms legs,feet   . Nocturia   . Osteoporosis   . Pain    takes Keppra and Topamax daily  . Peripheral edema    takes HCTZ daily  . Sleep apnea    has a CPAP but doesn't wear often  . Urinary frequency   . Urinary incontinence   . Urinary urgency     Family History  Problem Relation Age of Onset  . Arthritis Mother   . Hypertension Mother   . Kidney disease Mother   . COPD Mother   . Heart disease Mother 24       CAD  . Diabetes Mother   . Mental illness Mother   . Arthritis Father   . Diabetes Father   . Heart disease Father   . Cancer Father        prostate cancer  . Mental illness Father         Alzheimers Dementia  . Cancer Maternal Grandmother        brain tumor  . Cancer Maternal Grandfather        lung  . Diabetes Maternal Grandfather   . Early death Brother   . Anesthesia problems Neg Hx   . Hypotension Neg Hx   . Malignant hyperthermia Neg Hx   . Pseudochol deficiency Neg Hx     Past Surgical History:  Procedure Laterality Date  . ABDOMINAL HYSTERECTOMY    . ANTERIOR FUSION CERVICAL SPINE    . BACK SURGERY  2012  . bladder tacked    . CARPAL TUNNEL RELEASE     R&L  .  CESAREAN SECTION  1983  . CHOLECYSTECTOMY    . COLONOSCOPY    . COLONOSCOPY WITH PROPOFOL N/A 10/06/2017   Procedure: COLONOSCOPY WITH PROPOFOL;  Surgeon: Carol Ada, MD;  Location: [facility];  Service: Endoscopy;  Laterality: N/A;  . ESOPHAGEAL DILATION    . KNEE ARTHROSCOPY     L KNEE  . LUMBAR LAMINECTOMY    . LUMBAR LAMINECTOMY/DECOMPRESSION MICRODISCECTOMY  02/25/2011   Procedure: LUMBAR LAMINECTOMY/DECOMPRESSION MICRODISCECTOMY;  Surgeon: Floyce Stakes;  Location: [place] [facility];  Service: Neurosurgery;  Laterality: N/A;  Lumbar Four-Five Microdiscectomy  . LUMBAR WOUND DEBRIDEMENT  03/18/2011   Procedure: LUMBAR WOUND DEBRIDEMENT;  Surgeon: Floyce Stakes;  Location: [place] [facility];  Service: Neurosurgery;  Laterality: N/A;  Exploration of Lumbar wound  . POLYPECTOMY  10/06/2017   Procedure: POLYPECTOMY;  Surgeon: Carol Ada, MD;  Location: [facility];  Service: Endoscopy;;  . SEPTOPLASTY  10+yrs ago  . SPINAL CORD STIMULATOR INSERTION N/A 12/11/2015   Procedure: LUMBAR SPINAL CORD STIMULATOR INSERTION;  Surgeon: Clydell Hakim, MD;  Location: [place] [facility];  Service: Neurosurgery;  Laterality: N/A;   Social History   Occupational History  . Not on file  Tobacco Use  . Smoking status: Never Smoker  . Smokeless tobacco: Never Used  Vaping Use  . Vaping Use: Never used  Substance and Sexual Activity  . Alcohol use: No  . Drug use: No  . Sexual activity: Not on file

## 2020-08-12 ENCOUNTER — Other Ambulatory Visit: Payer: Self-pay | Admitting: Family Medicine

## 2020-08-12 NOTE — Telephone Encounter (Signed)
B12 last checked 12/24/2018, ok to send in refills or will the patient need labs?

## 2020-09-03 DIAGNOSIS — M542 Cervicalgia: Secondary | ICD-10-CM | POA: Diagnosis not present

## 2020-09-03 DIAGNOSIS — R519 Headache, unspecified: Secondary | ICD-10-CM | POA: Diagnosis not present

## 2020-09-09 ENCOUNTER — Other Ambulatory Visit: Payer: Self-pay | Admitting: Family Medicine

## 2020-09-09 ENCOUNTER — Other Ambulatory Visit: Payer: Self-pay | Admitting: Obstetrics and Gynecology

## 2020-09-09 DIAGNOSIS — B372 Candidiasis of skin and nail: Secondary | ICD-10-CM

## 2020-09-10 DIAGNOSIS — M47812 Spondylosis without myelopathy or radiculopathy, cervical region: Secondary | ICD-10-CM | POA: Diagnosis not present

## 2020-09-10 DIAGNOSIS — M9901 Segmental and somatic dysfunction of cervical region: Secondary | ICD-10-CM | POA: Diagnosis not present

## 2020-09-10 DIAGNOSIS — M4802 Spinal stenosis, cervical region: Secondary | ICD-10-CM | POA: Diagnosis not present

## 2020-09-10 NOTE — Telephone Encounter (Signed)
Medication refill request: nystatin ointment Last AEX:  02-28-20 Next AEX: not scheduled Last MMG (if hormonal medication request): n/a Refill authorized: please approve if appropriate

## 2020-09-14 DIAGNOSIS — M9901 Segmental and somatic dysfunction of cervical region: Secondary | ICD-10-CM | POA: Diagnosis not present

## 2020-09-14 DIAGNOSIS — M47812 Spondylosis without myelopathy or radiculopathy, cervical region: Secondary | ICD-10-CM | POA: Diagnosis not present

## 2020-09-14 DIAGNOSIS — M4802 Spinal stenosis, cervical region: Secondary | ICD-10-CM | POA: Diagnosis not present

## 2020-09-16 ENCOUNTER — Telehealth: Payer: Self-pay | Admitting: Family Medicine

## 2020-09-16 MED ORDER — CYANOCOBALAMIN 1000 MCG/ML IJ SOLN: 1000.0000 ug | INTRAMUSCULAR | 1 refills | Status: DC

## 2020-09-16 NOTE — Telephone Encounter (Signed)
Rx has been sent in. Nothing further needed. 

## 2020-09-16 NOTE — Telephone Encounter (Signed)
cyanocobalamin (,VITAMIN B-12,) 1000 MCG/ML injection  [facility] ([facility] - [place], [place] Phone:  [phone]  Fax:  [phone]

## 2020-09-17 DIAGNOSIS — M4802 Spinal stenosis, cervical region: Secondary | ICD-10-CM | POA: Diagnosis not present

## 2020-09-17 DIAGNOSIS — M9901 Segmental and somatic dysfunction of cervical region: Secondary | ICD-10-CM | POA: Diagnosis not present

## 2020-09-17 DIAGNOSIS — M47812 Spondylosis without myelopathy or radiculopathy, cervical region: Secondary | ICD-10-CM | POA: Diagnosis not present

## 2020-09-18 ENCOUNTER — Other Ambulatory Visit: Payer: Self-pay | Admitting: Family Medicine

## 2020-09-22 MED ORDER — VILAZODONE HCL 40 MG PO TABS: 40.0000 mg | ORAL_TABLET | Freq: Every day | ORAL | 0 refills | Status: DC

## 2020-09-22 MED ORDER — ALPRAZOLAM 1 MG PO TABS: 1.0000 mg | ORAL_TABLET | Freq: Three times a day (TID) | ORAL | 0 refills | Status: DC | PRN

## 2020-09-22 MED ORDER — BUPROPION HCL ER (XL) 150 MG PO TB24: ORAL_TABLET | ORAL | 0 refills | Status: DC

## 2020-09-22 MED ORDER — MONTELUKAST SODIUM 10 MG PO TABS: 10.0000 mg | ORAL_TABLET | Freq: Every day | ORAL | 0 refills | Status: DC

## 2020-09-22 MED ORDER — HYDROCHLOROTHIAZIDE 25 MG PO TABS: 25.0000 mg | ORAL_TABLET | Freq: Every day | ORAL | 0 refills | Status: DC

## 2020-09-22 NOTE — Telephone Encounter (Signed)
ALPRAZolam (XANAX) 1 MG tablet  Last refill 04/28/20 Last office visit 09/18/20

## 2020-09-28 DIAGNOSIS — M9901 Segmental and somatic dysfunction of cervical region: Secondary | ICD-10-CM | POA: Diagnosis not present

## 2020-09-28 DIAGNOSIS — M4802 Spinal stenosis, cervical region: Secondary | ICD-10-CM | POA: Diagnosis not present

## 2020-09-28 DIAGNOSIS — M47812 Spondylosis without myelopathy or radiculopathy, cervical region: Secondary | ICD-10-CM | POA: Diagnosis not present

## 2020-10-01 DIAGNOSIS — M47812 Spondylosis without myelopathy or radiculopathy, cervical region: Secondary | ICD-10-CM | POA: Diagnosis not present

## 2020-10-01 DIAGNOSIS — M9901 Segmental and somatic dysfunction of cervical region: Secondary | ICD-10-CM | POA: Diagnosis not present

## 2020-10-01 DIAGNOSIS — M4802 Spinal stenosis, cervical region: Secondary | ICD-10-CM | POA: Diagnosis not present

## 2020-10-05 DIAGNOSIS — M9901 Segmental and somatic dysfunction of cervical region: Secondary | ICD-10-CM | POA: Diagnosis not present

## 2020-10-05 DIAGNOSIS — M4802 Spinal stenosis, cervical region: Secondary | ICD-10-CM | POA: Diagnosis not present

## 2020-10-05 DIAGNOSIS — M47812 Spondylosis without myelopathy or radiculopathy, cervical region: Secondary | ICD-10-CM | POA: Diagnosis not present

## 2020-10-08 DIAGNOSIS — M47812 Spondylosis without myelopathy or radiculopathy, cervical region: Secondary | ICD-10-CM | POA: Diagnosis not present

## 2020-10-08 DIAGNOSIS — M9901 Segmental and somatic dysfunction of cervical region: Secondary | ICD-10-CM | POA: Diagnosis not present

## 2020-10-08 DIAGNOSIS — M4802 Spinal stenosis, cervical region: Secondary | ICD-10-CM | POA: Diagnosis not present

## 2020-10-20 DIAGNOSIS — M47812 Spondylosis without myelopathy or radiculopathy, cervical region: Secondary | ICD-10-CM | POA: Diagnosis not present

## 2020-10-20 DIAGNOSIS — M9901 Segmental and somatic dysfunction of cervical region: Secondary | ICD-10-CM | POA: Diagnosis not present

## 2020-10-20 DIAGNOSIS — M4802 Spinal stenosis, cervical region: Secondary | ICD-10-CM | POA: Diagnosis not present

## 2020-10-28 DIAGNOSIS — M9901 Segmental and somatic dysfunction of cervical region: Secondary | ICD-10-CM | POA: Diagnosis not present

## 2020-10-28 DIAGNOSIS — M47812 Spondylosis without myelopathy or radiculopathy, cervical region: Secondary | ICD-10-CM | POA: Diagnosis not present

## 2020-10-28 DIAGNOSIS — M4802 Spinal stenosis, cervical region: Secondary | ICD-10-CM | POA: Diagnosis not present

## 2020-11-04 ENCOUNTER — Other Ambulatory Visit: Payer: Self-pay | Admitting: Family Medicine

## 2020-11-04 DIAGNOSIS — M47812 Spondylosis without myelopathy or radiculopathy, cervical region: Secondary | ICD-10-CM | POA: Diagnosis not present

## 2020-11-04 DIAGNOSIS — M9901 Segmental and somatic dysfunction of cervical region: Secondary | ICD-10-CM | POA: Diagnosis not present

## 2020-11-04 DIAGNOSIS — M4802 Spinal stenosis, cervical region: Secondary | ICD-10-CM | POA: Diagnosis not present

## 2020-11-04 NOTE — Telephone Encounter (Signed)
Last filled 09/22/2020 Last OV 07/09/2020  Ok to fill?

## 2020-11-12 DIAGNOSIS — M47812 Spondylosis without myelopathy or radiculopathy, cervical region: Secondary | ICD-10-CM | POA: Diagnosis not present

## 2020-11-12 DIAGNOSIS — M4802 Spinal stenosis, cervical region: Secondary | ICD-10-CM | POA: Diagnosis not present

## 2020-11-12 DIAGNOSIS — M9901 Segmental and somatic dysfunction of cervical region: Secondary | ICD-10-CM | POA: Diagnosis not present

## 2020-11-25 ENCOUNTER — Other Ambulatory Visit: Payer: Self-pay | Admitting: Gastroenterology

## 2020-11-25 DIAGNOSIS — Z9689 Presence of other specified functional implants: Secondary | ICD-10-CM | POA: Diagnosis not present

## 2020-11-25 DIAGNOSIS — M4316 Spondylolisthesis, lumbar region: Secondary | ICD-10-CM | POA: Diagnosis not present

## 2020-11-25 DIAGNOSIS — M542 Cervicalgia: Secondary | ICD-10-CM | POA: Diagnosis not present

## 2020-11-25 DIAGNOSIS — R519 Headache, unspecified: Secondary | ICD-10-CM | POA: Diagnosis not present

## 2020-12-07 DIAGNOSIS — M47812 Spondylosis without myelopathy or radiculopathy, cervical region: Secondary | ICD-10-CM | POA: Diagnosis not present

## 2020-12-09 NOTE — Progress Notes (Signed)
Attempted to obtain medical history via telephone, unable to reach at this time. I left a voicemail to return pre surgical testing department's phone call.  

## 2020-12-17 ENCOUNTER — Encounter (HOSPITAL_COMMUNITY): Payer: Self-pay | Admitting: Gastroenterology

## 2020-12-18 ENCOUNTER — Encounter (HOSPITAL_COMMUNITY): Payer: Self-pay | Admitting: Gastroenterology

## 2020-12-18 ENCOUNTER — Other Ambulatory Visit: Payer: Self-pay

## 2020-12-18 ENCOUNTER — Ambulatory Visit (HOSPITAL_COMMUNITY)
Admission: RE | Admit: 2020-12-18 | Discharge: 2020-12-18 | Disposition: A | Discharge status: Home / Self Care | Payer: Medicare PPO | Source: Ambulatory Visit | Attending: Gastroenterology | Admitting: Gastroenterology

## 2020-12-18 ENCOUNTER — Encounter (HOSPITAL_COMMUNITY)
Admission: RE | Disposition: A | Discharge status: Home / Self Care | Payer: Self-pay | Source: Ambulatory Visit | Attending: Gastroenterology

## 2020-12-18 ENCOUNTER — Ambulatory Visit (HOSPITAL_COMMUNITY): Payer: Medicare PPO | Admitting: Anesthesiology

## 2020-12-18 DIAGNOSIS — Z880 Allergy status to penicillin: Secondary | ICD-10-CM | POA: Diagnosis not present

## 2020-12-18 DIAGNOSIS — K635 Polyp of colon: Secondary | ICD-10-CM | POA: Diagnosis not present

## 2020-12-18 DIAGNOSIS — Z79899 Other long term (current) drug therapy: Secondary | ICD-10-CM | POA: Diagnosis not present

## 2020-12-18 DIAGNOSIS — Z90710 Acquired absence of both cervix and uterus: Secondary | ICD-10-CM | POA: Diagnosis not present

## 2020-12-18 DIAGNOSIS — Z9049 Acquired absence of other specified parts of digestive tract: Secondary | ICD-10-CM | POA: Insufficient documentation

## 2020-12-18 DIAGNOSIS — Z885 Allergy status to narcotic agent status: Secondary | ICD-10-CM | POA: Insufficient documentation

## 2020-12-18 DIAGNOSIS — Z1211 Encounter for screening for malignant neoplasm of colon: Secondary | ICD-10-CM | POA: Insufficient documentation

## 2020-12-18 DIAGNOSIS — Z8249 Family history of ischemic heart disease and other diseases of the circulatory system: Secondary | ICD-10-CM | POA: Insufficient documentation

## 2020-12-18 DIAGNOSIS — K5730 Diverticulosis of large intestine without perforation or abscess without bleeding: Secondary | ICD-10-CM | POA: Diagnosis not present

## 2020-12-18 DIAGNOSIS — I10 Essential (primary) hypertension: Secondary | ICD-10-CM | POA: Diagnosis not present

## 2020-12-18 DIAGNOSIS — Z9682 Presence of neurostimulator: Secondary | ICD-10-CM | POA: Diagnosis not present

## 2020-12-18 DIAGNOSIS — D120 Benign neoplasm of cecum: Secondary | ICD-10-CM | POA: Diagnosis not present

## 2020-12-18 DIAGNOSIS — G4730 Sleep apnea, unspecified: Secondary | ICD-10-CM | POA: Diagnosis not present

## 2020-12-18 DIAGNOSIS — K219 Gastro-esophageal reflux disease without esophagitis: Secondary | ICD-10-CM | POA: Insufficient documentation

## 2020-12-18 DIAGNOSIS — Z86010 Personal history of colonic polyps: Secondary | ICD-10-CM | POA: Diagnosis not present

## 2020-12-18 DIAGNOSIS — Z139 Encounter for screening, unspecified: Secondary | ICD-10-CM | POA: Diagnosis not present

## 2020-12-18 DIAGNOSIS — Z888 Allergy status to other drugs, medicaments and biological substances status: Secondary | ICD-10-CM | POA: Diagnosis not present

## 2020-12-18 HISTORY — PX: POLYPECTOMY: SHX5525

## 2020-12-18 HISTORY — PX: COLONOSCOPY WITH PROPOFOL: SHX5780

## 2020-12-18 SURGERY — COLONOSCOPY WITH PROPOFOL
Anesthesia: Monitor Anesthesia Care

## 2020-12-18 MED ORDER — PROPOFOL 500 MG/50ML IV EMUL
INTRAVENOUS | Status: DC | PRN
  Administered 2020-12-18 (×2): 30 mg via INTRAVENOUS

## 2020-12-18 MED ORDER — SODIUM CHLORIDE 0.9 % IV SOLN: INTRAVENOUS | Status: DC

## 2020-12-18 MED ORDER — GLYCOPYRROLATE 0.2 MG/ML IJ SOLN
INTRAMUSCULAR | Status: DC | PRN
  Administered 2020-12-18: .2 mg via INTRAVENOUS

## 2020-12-18 MED ORDER — PROPOFOL 500 MG/50ML IV EMUL
INTRAVENOUS | Status: DC | PRN
  Administered 2020-12-18: 135 ug/kg/min via INTRAVENOUS

## 2020-12-18 MED ORDER — LIDOCAINE HCL (CARDIAC) PF 100 MG/5ML IV SOSY
PREFILLED_SYRINGE | INTRAVENOUS | Status: DC | PRN
  Administered 2020-12-18: 60 mg via INTRAVENOUS

## 2020-12-18 MED ORDER — LACTATED RINGERS IV SOLN
INTRAVENOUS | Status: AC | PRN
  Administered 2020-12-18: 1000 mL via INTRAVENOUS

## 2020-12-18 SURGICAL SUPPLY — 21 items

## 2020-12-18 NOTE — Transfer of Care (Signed)
Immediate Anesthesia Transfer of Care Note  Patient: Taylor Ritter  Procedure(s) Performed: Procedure(s): COLONOSCOPY WITH PROPOFOL (N/A) POLYPECTOMY  Patient Location: PACU  Anesthesia Type:MAC  Level of Consciousness:  sedated, patient cooperative and responds to stimulation  Airway & Oxygen Therapy:Patient Spontanous Breathing and Patient connected to face mask oxgen  Post-op Assessment:  Report given to PACU RN and Post -op Vital signs reviewed and stable  Post vital signs:  Reviewed and stable  Last Vitals:  Vitals:   12/18/20 0936  BP: 139/83  Pulse: 76  Resp: 13  Temp: 36.9 C  SpO2: 448%    Complications: No apparent anesthesia complications

## 2020-12-18 NOTE — Op Note (Signed)
[facility] Patient Name: Taylor Ritter Procedure Date: 12/18/2020 MRN: [id number] Attending MD: Carol Ada , MD Date of Birth: [birth year] CSN: [id number] Age: 60 Admit Type: Outpatient Procedure:                Colonoscopy Indications:              High risk colon cancer surveillance: Personal                            history of colonic polyps Providers:                Carol Ada, MD, Elmer Ramp. Tilden Dome, RN, Benetta Spar, Technician Referring MD:              Medicines:                Propofol per Anesthesia Complications:            No immediate complications. Estimated Blood Loss:     Estimated blood loss: none. Procedure:                Pre-Anesthesia Assessment:                           - Prior to the procedure, a History and Physical                            was performed, and patient medications and                            allergies were reviewed. The patient's tolerance of                            previous anesthesia was also reviewed. The risks                            and benefits of the procedure and the sedation                            options and risks were discussed with the patient.                            All questions were answered, and informed consent                            was obtained. Prior Anticoagulants: The patient has                            taken no previous anticoagulant or antiplatelet                            agents. ASA Grade Assessment: III - A patient with                            severe  systemic disease. After reviewing the risks                            and benefits, the patient was deemed in                            satisfactory condition to undergo the procedure.                           - Sedation was administered by an anesthesia                            professional. Deep sedation was attained.                           After obtaining informed consent, the  colonoscope                            was passed under direct vision. Throughout the                            procedure, the patient's blood pressure, pulse, and                            oxygen saturations were monitored continuously. The                            CF-HQ190L ([id number]) Olympus colonoscope was                            introduced through the anus and advanced to the the                            cecum, identified by appendiceal orifice and                            ileocecal valve. The colonoscopy was performed                            without difficulty. The patient tolerated the                            procedure well. The quality of the bowel                            preparation was excellent. The ileocecal valve,                            appendiceal orifice, and rectum were photographed. Scope In: 10:21:14 AM Scope Out: 10:45:07 AM Scope Withdrawal Time: 0 hours 12 minutes 42 seconds  Total Procedure Duration: 0 hours 23 minutes 53 seconds  Findings:      Four sessile polyps were found in the transverse colon and cecum. The       polyps were 1 to 3 mm in size. These  polyps were removed with a cold       snare. Resection and retrieval were complete.      A few small-mouthed diverticula were found in the sigmoid colon. Impression:               - Four 1 to 3 mm polyps in the transverse colon and                            in the cecum, removed with a cold snare. Resected                            and retrieved.                           - Diverticulosis in the sigmoid colon. Moderate Sedation:      Not Applicable - Patient had care per Anesthesia. Recommendation:           - Patient has a contact number available for                            emergencies. The signs and symptoms of potential                            delayed complications were discussed with the                            patient. Return to normal activities tomorrow.                             Written discharge instructions were provided to the                            patient.                           - Resume previous diet.                           - Continue present medications.                           - Await pathology results.                           - Repeat colonoscopy in 5 years for surveillance. Procedure Code(s):        --- Professional ---                           [phone], Colonoscopy, flexible; with removal of                            tumor(s), polyp(s), or other lesion(s) by snare                            technique Diagnosis Code(s):        --- Professional ---  K63.5, Polyp of colon                           Z86.010, Personal history of colonic polyps                           K57.30, Diverticulosis of large intestine without                            perforation or abscess without bleeding CPT copyright 2019 American Medical Association. All rights reserved. The codes documented in this report are preliminary and upon coder review may  be revised to meet current compliance requirements. Carol Ada, MD Carol Ada, MD 12/18/2020 10:49:23 AM This report has been signed electronically. Number of Addenda: 0

## 2020-12-18 NOTE — H&P (Signed)
Taylor Ritter HPI: The patient's colonoscopy on 10/06/2017 was positive for four adenomas.  Past Medical History:  Diagnosis Date   Allergy    takes Allegra daily and uses nasal spray daily.Takes Benadryl as needed.Uses eye drops daily   Anemia    B 12 shots monthly   Anxiety    takes Xanax as needed   Arthritis    osteoporosis   Asthma    Proventil inhaler as needed   Chronic back pain    DDD and stenosis   Complication of anesthesia    pt states she was woke up during surgery   Depression    takes Wellbutrin daily   Fibromyalgia    GERD (gastroesophageal reflux disease)    takes Nexium daily   Headache(784.0)    takes Topamax daily as needed   History of bronchitis    2016   History of colon polyps    benign   History of vertigo    took Meclizine several yrs ago   Hypertension    followed by [facility]   IBS (irritable bowel syndrome)    takes LInzess daily   Insomnia    Joint pain    Muscle spasm    takes Flexeril daily as needed   Neuromuscular disorder (HCC)    numbness arms legs,feet    Nocturia    Osteoporosis    Pain    takes Keppra and Topamax daily   Peripheral edema    takes HCTZ daily   Sleep apnea    has a CPAP but doesn't wear often   Urinary frequency    Urinary incontinence    Urinary urgency     Past Surgical History:  Procedure Laterality Date   ABDOMINAL HYSTERECTOMY     ANTERIOR FUSION CERVICAL SPINE     BACK SURGERY  2012   bladder tacked     CARPAL TUNNEL RELEASE     R&L   CESAREAN SECTION  1983   CHOLECYSTECTOMY     COLONOSCOPY     COLONOSCOPY WITH PROPOFOL N/A 10/06/2017   Procedure: COLONOSCOPY WITH PROPOFOL;  Surgeon: Carol Ada, MD;  [facility]: [facility];  Service: Endoscopy;  Laterality: N/A;   ESOPHAGEAL DILATION     KNEE ARTHROSCOPY     L KNEE   LUMBAR LAMINECTOMY     LUMBAR LAMINECTOMY/DECOMPRESSION MICRODISCECTOMY  02/25/2011   Procedure: LUMBAR LAMINECTOMY/DECOMPRESSION  MICRODISCECTOMY;  Surgeon: Floyce Stakes;  Location: [place] [facility];  Service: Neurosurgery;  Laterality: N/A;  Lumbar Four-Five Microdiscectomy   LUMBAR WOUND DEBRIDEMENT  03/18/2011   Procedure: LUMBAR WOUND DEBRIDEMENT;  Surgeon: Floyce Stakes;  Location: [place] [facility];  Service: Neurosurgery;  Laterality: N/A;  Exploration of Lumbar wound   POLYPECTOMY  10/06/2017   Procedure: POLYPECTOMY;  Surgeon: Carol Ada, MD;  Location: [facility];  Service: Endoscopy;;   SEPTOPLASTY  10+yrs ago   [place] N/A 12/11/2015   Procedure: LUMBAR SPINAL CORD STIMULATOR INSERTION;  Surgeon: Clydell Hakim, MD;  Location: [place] [facility];  Service: Neurosurgery;  Laterality: N/A;    Family History  Problem Relation Age of Onset   Arthritis Mother    Hypertension Mother    Kidney disease Mother    COPD Mother    Heart disease Mother 35       CAD   Diabetes Mother    Mental illness Mother    Arthritis Father    Diabetes Father    Heart disease Father  Cancer Father        prostate cancer   Mental illness Father        Alzheimers Dementia   Cancer Maternal Grandmother        brain tumor   Cancer Maternal Grandfather        lung   Diabetes Maternal Grandfather    Early death Brother    Anesthesia problems Neg Hx    Hypotension Neg Hx    Malignant hyperthermia Neg Hx    Pseudochol deficiency Neg Hx     Social History:  reports that she has never smoked. She has never used smokeless tobacco. She reports that she does not drink alcohol and does not use drugs.  Allergies:  Allergies  Allergen Reactions   Other Other (See Comments)    Nylon sutures had to remove to allow healing  Vicryl sutures - do not use   Percocet [Oxycodone-Acetaminophen] Other (See Comments)    "makes her crazy"   Amoxicillin-Pot Clavulanate Other (See Comments)    Unknown reaction   Codeine Hives   Dye Fdc Red [Red Dye] Hives and Other (See Comments)    Scan dye, broke out in hives all  over once with a neck scan   Morphine And Related Hives and Itching   Vesicare [Solifenacin Succinate] Itching    Medications: Scheduled: Continuous:  sodium chloride      No results found for this or any previous visit (from the past 24 hour(s)).   No results found.  ROS:  As stated above in the HPI otherwise negative.  There were no vitals taken for this visit.    PE: Gen: NAD, Alert and Oriented HEENT:  [place]/AT, EOMI Neck: Supple, no LAD Lungs: CTA Bilaterally CV: RRR without M/G/R ABD: Soft, NTND, +BS Ext: No C/C/E  Assessment/Plan: 1) Personal history of polyps - colonoscopy.  Taylor Ritter D 12/18/2020, 9:29 AM

## 2020-12-18 NOTE — Anesthesia Postprocedure Evaluation (Signed)
Anesthesia Post Note  Patient: Taylor Ritter  Procedure(s) Performed: COLONOSCOPY WITH PROPOFOL POLYPECTOMY     Patient location during evaluation: Endoscopy Anesthesia Type: General Level of consciousness: awake and alert Pain management: pain level controlled Vital Signs Assessment: post-procedure vital signs reviewed and stable Respiratory status: spontaneous breathing, nonlabored ventilation, respiratory function stable and patient connected to nasal cannula oxygen Cardiovascular status: blood pressure returned to baseline and stable Postop Assessment: no apparent nausea or vomiting Anesthetic complications: no   No notable events documented.  Last Vitals:  Vitals:   12/18/20 1100 12/18/20 1110  BP: (!) 146/82 139/79  Pulse: 89 90  Resp: 13 (!) 24  Temp:    SpO2: 92% 98%    Last Pain:  Vitals:   12/18/20 0936  TempSrc: Oral  PainSc: 0-No pain                 Nicosha Struve L Whyatt Klinger

## 2020-12-18 NOTE — Discharge Instructions (Signed)

## 2020-12-18 NOTE — Anesthesia Preprocedure Evaluation (Addendum)
Anesthesia Evaluation  Patient identified by MRN, date of birth, ID band Patient awake    Reviewed: Allergy & Precautions, NPO status , Patient's Chart, lab work & pertinent test results  Airway Mallampati: II  TM Distance: >3 FB Neck ROM: Full    Dental no notable dental hx. (+) Teeth Intact, Dental Advisory Given   Pulmonary asthma , sleep apnea ,    Pulmonary exam normal breath sounds clear to auscultation       Cardiovascular hypertension, Pt. on medications Normal cardiovascular exam Rhythm:Regular Rate:Normal     Neuro/Psych  Headaches, PSYCHIATRIC DISORDERS Anxiety Depression    GI/Hepatic Neg liver ROS, GERD  Medicated,  Endo/Other  Morbid obesity  Renal/GU negative Renal ROS  negative genitourinary   Musculoskeletal  (+) Arthritis , Fibromyalgia -  Abdominal   Peds  Hematology negative hematology ROS (+)   Anesthesia Other Findings   Reproductive/Obstetrics                          Anesthesia Physical Anesthesia Plan  ASA: 3  Anesthesia Plan: MAC   Post-op Pain Management:    Induction: Intravenous  PONV Risk Score and Plan: 2 and Propofol infusion and Treatment may vary due to age or medical condition  Airway Management Planned: Natural Airway  Additional Equipment:   Intra-op Plan:   Post-operative Plan:   Informed Consent: I have reviewed the patients History and Physical, chart, labs and discussed the procedure including the risks, benefits and alternatives for the proposed anesthesia with the patient or authorized representative who has indicated his/her understanding and acceptance.     Dental advisory given  Plan Discussed with: CRNA  Anesthesia Plan Comments:         Anesthesia Quick Evaluation

## 2020-12-19 LAB — POCT I-STAT, CHEM 8
BUN: 7 mg/dL — ABNORMAL LOW (ref 8–23)
Calcium, Ion: 1.16 mmol/L (ref 1.15–1.40)
Chloride: 101 mmol/L (ref 98–111)
Creatinine, Ser: 1 mg/dL (ref 0.44–1.00)
Glucose, Bld: 107 mg/dL — ABNORMAL HIGH (ref 70–99)
HCT: 42 % (ref 36.0–46.0)
Hemoglobin: 14.3 g/dL (ref 12.0–15.0)
Potassium: 2.9 mmol/L — ABNORMAL LOW (ref 3.5–5.1)
Sodium: 139 mmol/L (ref 135–145)
TCO2: 23 mmol/L (ref 22–32)

## 2020-12-21 ENCOUNTER — Other Ambulatory Visit: Payer: Self-pay

## 2020-12-21 ENCOUNTER — Encounter (HOSPITAL_COMMUNITY): Payer: Self-pay | Admitting: Gastroenterology

## 2020-12-21 DIAGNOSIS — E875 Hyperkalemia: Secondary | ICD-10-CM

## 2020-12-21 LAB — SURGICAL PATHOLOGY

## 2020-12-25 ENCOUNTER — Other Ambulatory Visit: Payer: Self-pay

## 2020-12-25 ENCOUNTER — Ambulatory Visit (INDEPENDENT_AMBULATORY_CARE_PROVIDER_SITE_OTHER): Payer: Medicare PPO | Admitting: Family Medicine

## 2020-12-25 VITALS — BP 134/80 | HR 85 | Temp 98.0°F | Ht 65.0 in | Wt 277.9 lb

## 2020-12-25 DIAGNOSIS — K219 Gastro-esophageal reflux disease without esophagitis: Secondary | ICD-10-CM | POA: Diagnosis not present

## 2020-12-25 DIAGNOSIS — E876 Hypokalemia: Secondary | ICD-10-CM

## 2020-12-25 DIAGNOSIS — F339 Major depressive disorder, recurrent, unspecified: Secondary | ICD-10-CM | POA: Diagnosis not present

## 2020-12-25 DIAGNOSIS — R5383 Other fatigue: Secondary | ICD-10-CM

## 2020-12-25 DIAGNOSIS — E538 Deficiency of other specified B group vitamins: Secondary | ICD-10-CM

## 2020-12-25 DIAGNOSIS — R7303 Prediabetes: Secondary | ICD-10-CM | POA: Diagnosis not present

## 2020-12-25 LAB — BASIC METABOLIC PANEL
BUN: 13 mg/dL (ref 6–23)
CO2: 28 mEq/L (ref 19–32)
Calcium: 9.2 mg/dL (ref 8.4–10.5)
Chloride: 103 mEq/L (ref 96–112)
Creatinine, Ser: 1.26 mg/dL — ABNORMAL HIGH (ref 0.40–1.20)
GFR: 46.18 mL/min — ABNORMAL LOW (ref >60.00)
Glucose, Bld: 122 mg/dL — ABNORMAL HIGH (ref 70–99)
Potassium: 3.2 mEq/L — ABNORMAL LOW (ref 3.5–5.1)
Sodium: 140 mEq/L (ref 135–145)

## 2020-12-25 LAB — TSH: TSH: 3.11 u[IU]/mL (ref 0.35–5.50)

## 2020-12-25 LAB — VITAMIN B12: Vitamin B-12: 776 pg/mL (ref 211–911)

## 2020-12-25 MED ORDER — NEXIUM 40 MG PO CPDR: DELAYED_RELEASE_CAPSULE | ORAL | 3 refills | Status: DC

## 2020-12-25 MED ORDER — VILAZODONE HCL 40 MG PO TABS: 40.0000 mg | ORAL_TABLET | Freq: Every day | ORAL | 3 refills | Status: DC

## 2020-12-25 MED ORDER — BUPROPION HCL ER (XL) 150 MG PO TB24: ORAL_TABLET | ORAL | 3 refills | Status: DC

## 2020-12-25 MED ORDER — CYANOCOBALAMIN 1000 MCG/ML IJ SOLN: 1000.0000 ug | INTRAMUSCULAR | 1 refills | Status: DC

## 2020-12-25 NOTE — Progress Notes (Signed)
Established Patient Office Visit  Subjective:  Patient ID: Taylor Ritter, female    DOB: 1960/12/06  Age: 60 y.o. MRN: [id number]  CC: No chief complaint on file.   HPI Taylor Ritter presents for several items as follows  She had recent colonoscopy.  She had adenomatous polyp and hyperplastic polyp.  We had received precolonoscopy lab which revealed potassium 2.9.  She states that she had been advised to hold her potassium supplement for 1 week prior to colonoscopy.  She is back on that now.  No major muscle cramps.  History of prediabetes range blood sugars.  No recent A1c.  She has history of B12 deficiency and is on injection therapy.  She has not had recent B12 level.  She is requesting thyroid check.  Last checked couple years ago.  She has longstanding history of obesity.  She has managed to lose 7 pounds by scaling back overall calories in recent weeks.  She has history of recurrent depression.  She is requesting refills of Viibryd and Wellbutrin.  She feels her depression is stable at this time.  She has longstanding history of GERD and takes Nexium twice daily.  Needs refills.  She is tried scaling back in the past without success.  Past Medical History:  Diagnosis Date   Allergy    takes Allegra daily and uses nasal spray daily.Takes Benadryl as needed.Uses eye drops daily   Anemia    B 12 shots monthly   Anxiety    takes Xanax as needed   Arthritis    osteoporosis   Asthma    Proventil inhaler as needed   Chronic back pain    DDD and stenosis   Complication of anesthesia    pt states she was woke up during surgery   Depression    takes Wellbutrin daily   Fibromyalgia    GERD (gastroesophageal reflux disease)    takes Nexium daily   Headache(784.0)    takes Topamax daily as needed   History of bronchitis    2016   History of colon polyps    benign   History of vertigo    took Meclizine several yrs ago   Hypertension    followed by [facility]   IBS (irritable bowel syndrome)    takes LInzess daily   Insomnia    Joint pain    Muscle spasm    takes Flexeril daily as needed   Neuromuscular disorder (HCC)    numbness arms legs,feet    Nocturia    Osteoporosis    Pain    takes Keppra and Topamax daily   Peripheral edema    takes HCTZ daily   Sleep apnea    has a CPAP but doesn't wear often   Urinary frequency    Urinary incontinence    Urinary urgency     Past Surgical History:  Procedure Laterality Date   ABDOMINAL HYSTERECTOMY     ANTERIOR FUSION CERVICAL SPINE     BACK SURGERY  2012   bladder tacked     CARPAL TUNNEL RELEASE     R&L   CESAREAN SECTION  1983   CHOLECYSTECTOMY     COLONOSCOPY     COLONOSCOPY WITH PROPOFOL N/A 10/06/2017   Procedure: COLONOSCOPY WITH PROPOFOL;  Surgeon: Carol Ada, MD;  Location: [facility];  Service: Endoscopy;  Laterality: N/A;   COLONOSCOPY WITH PROPOFOL N/A 12/18/2020   Procedure: COLONOSCOPY WITH PROPOFOL;  Surgeon: Carol Ada, MD;  Location: [facility];  Service: Endoscopy;  Laterality: N/A;   ESOPHAGEAL DILATION     KNEE ARTHROSCOPY     L KNEE   LUMBAR LAMINECTOMY     LUMBAR LAMINECTOMY/DECOMPRESSION MICRODISCECTOMY  02/25/2011   Procedure: LUMBAR LAMINECTOMY/DECOMPRESSION MICRODISCECTOMY;  Surgeon: Floyce Stakes;  Location: [place] [facility];  Service: Neurosurgery;  Laterality: N/A;  Lumbar Four-Five Microdiscectomy   LUMBAR WOUND DEBRIDEMENT  03/18/2011   Procedure: LUMBAR WOUND DEBRIDEMENT;  Surgeon: Floyce Stakes;  Location: [place] [facility];  Service: Neurosurgery;  Laterality: N/A;  Exploration of Lumbar wound   POLYPECTOMY  10/06/2017   Procedure: POLYPECTOMY;  Surgeon: Carol Ada, MD;  Location: [facility];  Service: Endoscopy;;   POLYPECTOMY  12/18/2020   Procedure: POLYPECTOMY;  Surgeon: Carol Ada, MD;  Location: [facility];  Service: Endoscopy;;   SEPTOPLASTY  10+yrs ago   [place] N/A 12/11/2015    Procedure: LUMBAR SPINAL CORD STIMULATOR INSERTION;  Surgeon: Clydell Hakim, MD;  Location: [place] [facility];  Service: Neurosurgery;  Laterality: N/A;    Family History  Problem Relation Age of Onset   Arthritis Mother    Hypertension Mother    Kidney disease Mother    COPD Mother    Heart disease Mother 69       CAD   Diabetes Mother    Mental illness Mother    Arthritis Father    Diabetes Father    Heart disease Father    Cancer Father        prostate cancer   Mental illness Father        Alzheimers Dementia   Cancer Maternal Grandmother        brain tumor   Cancer Maternal Grandfather        lung   Diabetes Maternal Grandfather    Early death Brother    Anesthesia problems Neg Hx    Hypotension Neg Hx    Malignant hyperthermia Neg Hx    Pseudochol deficiency Neg Hx     Social History   Socioeconomic History   Marital status: Married    Spouse name: Not on file   Number of children: Not on file   Years of education: Not on file   Highest education level: Not on file  Occupational History   Not on file  Tobacco Use   Smoking status: Never   Smokeless tobacco: Never  Vaping Use   Vaping Use: Never used  Substance and Sexual Activity   Alcohol use: No   Drug use: No   Sexual activity: Not on file  Other Topics Concern   Not on file  Social History Narrative   Not on file   Social Determinants of Health   Financial Resource Strain: Low Risk    Difficulty of Paying Living Expenses: Not hard at all  Food Insecurity: No Food Insecurity   Worried About Charity fundraiser in the Last Year: Never true   Ran Out of Food in the Last Year: Never true  Transportation Needs: No Transportation Needs   Lack of Transportation (Medical): No   Lack of Transportation (Non-Medical): No  Physical Activity: Inactive   Days of Exercise per Week: 0 days   Minutes of Exercise per Session: 0 min  Stress: No Stress Concern Present   Feeling of Stress : Not at all  Social  Connections: Moderately Isolated   Frequency of Communication with Friends and Family: Twice a week   Frequency of Social Gatherings with Friends  and Family: Once a week   Attends Religious Services: Never   Active Member of Clubs or Organizations: No   Attends Music therapist: Never   Marital Status: Married  Human resources officer Violence: Not At Risk   Fear of Current or Ex-Partner: No   Emotionally Abused: No   Physically Abused: No   Sexually Abused: No    Outpatient Medications Prior to Visit  Medication Sig Dispense Refill   albuterol (VENTOLIN HFA) 108 (90 Base) MCG/ACT inhaler Inhale 1-2 puffs into the lungs every 6 (six) hours as needed for wheezing or shortness of breath.     ALPRAZolam (XANAX) 1 MG tablet TAKE 1 TABLET THREE TIMES DAILY AS NEEDED FOR ANXIETY 30 tablet 0   azelastine (ASTELIN) 0.1 % nasal spray Place 1 spray into both nostrils 2 (two) times daily as needed for allergies. Use in each nostril as directed     budesonide-formoterol (SYMBICORT) 160-4.5 MCG/ACT inhaler Inhale 2 puffs into the lungs 2 (two) times daily as needed (respiratory issues.).     cyclobenzaprine (FLEXERIL) 5 MG tablet Take 5 mg by mouth 2 (two) times daily as needed for muscle spasms.     diphenhydrAMINE (BENADRYL) 50 MG capsule Take 50 mg by mouth every 6 (six) hours as needed for allergies.     EPINEPHrine 0.3 mg/0.3 mL IJ SOAJ injection Inject 0.3 mg into the muscle once.     Estradiol 10 MCG TABS vaginal tablet Place 1 tablet (10 mcg total) vaginally 2 (two) times a week. (Patient taking differently: Place 1 tablet vaginally daily as needed (vaginal dryness).) 24 tablet 3   famotidine (PEPCID) 10 MG tablet Take 20 mg by mouth at bedtime.     fexofenadine (ALLEGRA) 180 MG tablet Take 180 mg by mouth daily.     hydrochlorothiazide (HYDRODIURIL) 25 MG tablet Take 1 tablet (25 mg total) by mouth daily. 90 tablet 0   ibuprofen (ADVIL) 200 MG tablet Take 800 mg by mouth every 8 (eight)  hours as needed (for pain.).     levETIRAcetam (KEPPRA) 500 MG tablet Take 1,000 mg by mouth at bedtime.      linaclotide (LINZESS) 290 MCG CAPS capsule Take 580 mcg by mouth in the morning.     montelukast (SINGULAIR) 10 MG tablet Take 1 tablet (10 mg total) by mouth at bedtime. 90 tablet 0   Multiple Vitamin (MULTIVITAMIN WITH MINERALS) TABS tablet Take 1 tablet by mouth daily. One A Day for Women     nystatin (MYCOSTATIN/NYSTOP) powder Apply topically 4 (four) times daily. (Patient taking differently: Apply 1 application topically 4 (four) times daily as needed (yeast).) 30 g 1   nystatin ointment (MYCOSTATIN) APPLY 1 APPLICATION TOPICALLY 2 (TWO) TIMES DAILY AS NEEDED 30 g 0   pilocarpine (SALAGEN) 5 MG tablet Take 5 mg by mouth 2 (two) times daily as needed (dryness).     potassium chloride (KLOR-CON) 10 MEQ tablet TAKE 1 TABLET BY MOUTH EVERY DAY 90 tablet 3   potassium gluconate 595 (99 K) MG TABS tablet Take 595 mg by mouth in the morning.     topiramate (TOPAMAX) 100 MG tablet Take 200 mg by mouth at bedtime.      vitamin B-12 (CYANOCOBALAMIN) 1000 MCG tablet Take 1,000 mcg by mouth in the morning.     buPROPion (WELLBUTRIN XL) 150 MG 24 hr tablet Take 3 tabs daily.  Please schedule a physical for more refills. thanks 270 tablet 0   cyanocobalamin (,VITAMIN B-12,) 1000 MCG/ML  injection Inject 1 mL (1,000 mcg total) into the muscle every 30 (thirty) days. 10 mL 1   montelukast (SINGULAIR) 10 MG tablet Take 1 tablet (10 mg total) by mouth at bedtime. 90 tablet 0   NEXIUM 40 MG capsule TAKE 1 CAPSULE TWICE DAILY (Patient taking differently: Take 80 mg by mouth in the morning.) 180 capsule 3   Vilazodone HCl (VIIBRYD) 40 MG TABS Take 1 tablet (40 mg total) by mouth daily. 90 tablet 0   No facility-administered medications prior to visit.    Allergies  Allergen Reactions   Other Other (See Comments)    Nylon sutures had to remove to allow healing  Vicryl sutures - do not use   Percocet  [Oxycodone-Acetaminophen] Other (See Comments)    "makes her crazy"   Amoxicillin-Pot Clavulanate Other (See Comments)    Unknown reaction   Codeine Hives   Dye Fdc Red [Red Dye] Hives and Other (See Comments)    Scan dye, broke out in hives all over once with a neck scan   Morphine And Related Hives and Itching   Vesicare [Solifenacin Succinate] Itching    ROS Review of Systems  Constitutional:  Negative for chills and fever.  Respiratory:  Negative for shortness of breath.   Cardiovascular:  Negative for chest pain.  Genitourinary:  Negative for dysuria.  Psychiatric/Behavioral:  Negative for dysphoric mood.      Objective:    Physical Exam Vitals reviewed.  Constitutional:      Appearance: Normal appearance.  Cardiovascular:     Rate and Rhythm: Normal rate and regular rhythm.  Pulmonary:     Effort: Pulmonary effort is normal.     Breath sounds: Normal breath sounds.  Musculoskeletal:     Cervical back: Neck supple.  Lymphadenopathy:     Cervical: No cervical adenopathy.  Neurological:     Mental Status: She is alert.    BP 134/80 (BP Location: Left Arm, Patient Position: Sitting, Cuff Size: Normal)   Pulse 85   Temp 98 F (36.7 C) (Oral)   Ht 5\' 5"  (1.651 m)   Wt 277 lb 14.4 oz (126.1 kg)   SpO2 97%   BMI 46.24 kg/m  Wt Readings from Last 3 Encounters:  12/25/20 277 lb 14.4 oz (126.1 kg)  07/09/20 283 lb (128.4 kg)  04/24/20 287 lb 9.6 oz (130.5 kg)     Health Maintenance Due  Topic Date Due   HIV Screening  Never done   Hepatitis C Screening  Never done   Zoster Vaccines- Shingrix (1 of 2) Never done   COVID-19 Vaccine (4 - Booster for Moderna series) 07/01/2020    There are no preventive care reminders to display for this patient.  Lab Results  Component Value Date   TSH 3.670 12/24/2018   Lab Results  Component Value Date   WBC 9.4 09/10/2019   HGB 14.3 12/18/2020   HCT 42.0 12/18/2020   MCV 96.3 09/10/2019   PLT 265 09/10/2019    Lab Results  Component Value Date   NA 139 12/18/2020   K 2.9 (L) 12/18/2020   CO2 26 03/27/2020   GLUCOSE 107 (H) 12/18/2020   BUN 7 (L) 12/18/2020   CREATININE 1.00 12/18/2020   BILITOT 0.3 12/24/2018   ALKPHOS 103 12/24/2018   AST 40 12/24/2018   ALT 31 12/24/2018   PROT 6.9 12/24/2018   ALBUMIN 4.2 12/24/2018   CALCIUM 9.7 03/27/2020   ANIONGAP 13 09/10/2019   GFR 48.26 (L) 03/27/2020  Lab Results  Component Value Date   CHOL 189 12/24/2018   Lab Results  Component Value Date   HDL 45 12/24/2018   Lab Results  Component Value Date   LDLCALC 111 (H) 12/24/2018   Lab Results  Component Value Date   TRIG 191 (H) 12/24/2018   Lab Results  Component Value Date   CHOLHDL 4.2 12/24/2018   Lab Results  Component Value Date   HGBA1C 5.8 (H) 12/24/2018      Assessment & Plan:   #1 history of hyperglycemia.  A1c today 6.1%.  Continue weight loss efforts and lower glycemic diet.  We recommend she get this monitor at least once yearly  #2 recent hypokalemia.  Patient takes HCTZ and recently was off her potassium supplement.  Recheck basic metabolic panel today  #3 history of recurrent depression.  Stable.  Refilled Viibryd and Wellbutrin for 1 year  #4 history of fatigue.  Patient requesting TSH.  We will recheck this with labs today  #5 history of GERD.  Patient on high-dose PPI.  She is tried unsuccessfully to taper back in the past.  Refilled Nexium for 1 year.  Flu vaccine offered but declined   Meds ordered this encounter  Medications   cyanocobalamin (,VITAMIN B-12,) 1000 MCG/ML injection    Sig: Inject 1 mL (1,000 mcg total) into the muscle every 30 (thirty) days.    Dispense:  10 mL    Refill:  1   Vilazodone HCl (VIIBRYD) 40 MG TABS    Sig: Take 1 tablet (40 mg total) by mouth daily.    Dispense:  90 tablet    Refill:  3   NEXIUM 40 MG capsule    Sig: TAKE 1 CAPSULE TWICE DAILY    Dispense:  180 capsule    Refill:  3   buPROPion  (WELLBUTRIN XL) 150 MG 24 hr tablet    Sig: Take 3 tabs daily.  Please schedule a physical for more refills. thanks    Dispense:  270 tablet    Refill:  3    Follow-up: No follow-ups on file.    Carolann Littler, MD

## 2020-12-29 ENCOUNTER — Other Ambulatory Visit: Payer: Self-pay

## 2020-12-29 MED ORDER — POTASSIUM CHLORIDE ER 10 MEQ PO TBCR: EXTENDED_RELEASE_TABLET | ORAL | 3 refills | Status: DC

## 2020-12-31 DIAGNOSIS — R519 Headache, unspecified: Secondary | ICD-10-CM | POA: Diagnosis not present

## 2020-12-31 DIAGNOSIS — M47812 Spondylosis without myelopathy or radiculopathy, cervical region: Secondary | ICD-10-CM | POA: Diagnosis not present

## 2020-12-31 DIAGNOSIS — M4316 Spondylolisthesis, lumbar region: Secondary | ICD-10-CM | POA: Diagnosis not present

## 2020-12-31 DIAGNOSIS — Z9689 Presence of other specified functional implants: Secondary | ICD-10-CM | POA: Diagnosis not present

## 2021-01-12 DIAGNOSIS — E538 Deficiency of other specified B group vitamins: Secondary | ICD-10-CM | POA: Diagnosis not present

## 2021-01-12 DIAGNOSIS — F32A Depression, unspecified: Secondary | ICD-10-CM | POA: Diagnosis not present

## 2021-01-12 DIAGNOSIS — B0223 Postherpetic polyneuropathy: Secondary | ICD-10-CM | POA: Diagnosis not present

## 2021-01-12 DIAGNOSIS — J301 Allergic rhinitis due to pollen: Secondary | ICD-10-CM | POA: Diagnosis not present

## 2021-01-12 DIAGNOSIS — F419 Anxiety disorder, unspecified: Secondary | ICD-10-CM | POA: Diagnosis not present

## 2021-01-12 DIAGNOSIS — J449 Chronic obstructive pulmonary disease, unspecified: Secondary | ICD-10-CM | POA: Diagnosis not present

## 2021-01-12 DIAGNOSIS — G8929 Other chronic pain: Secondary | ICD-10-CM | POA: Diagnosis not present

## 2021-01-12 DIAGNOSIS — I10 Essential (primary) hypertension: Secondary | ICD-10-CM | POA: Diagnosis not present

## 2021-01-14 DIAGNOSIS — M47812 Spondylosis without myelopathy or radiculopathy, cervical region: Secondary | ICD-10-CM | POA: Diagnosis not present

## 2021-02-04 ENCOUNTER — Other Ambulatory Visit: Payer: Self-pay | Admitting: Family Medicine

## 2021-02-15 DIAGNOSIS — Z9689 Presence of other specified functional implants: Secondary | ICD-10-CM | POA: Diagnosis not present

## 2021-02-15 DIAGNOSIS — R519 Headache, unspecified: Secondary | ICD-10-CM | POA: Diagnosis not present

## 2021-02-15 DIAGNOSIS — M4316 Spondylolisthesis, lumbar region: Secondary | ICD-10-CM | POA: Diagnosis not present

## 2021-02-15 DIAGNOSIS — M47812 Spondylosis without myelopathy or radiculopathy, cervical region: Secondary | ICD-10-CM | POA: Diagnosis not present

## 2021-03-12 IMAGING — CT CT CERVICAL SPINE W/ CM
2 series · 10 of 14 positions shown, 12 images · non-contrast
Comparison: none

CLINICAL DATA: Bilateral upper extremity numbness. New onset of
left groin pain.
TECHNIQUE: Contiguous axial images were obtained through the Cervical and
Lumbar spine after the intrathecal infusion of infusion. Coronal and
sagittal reconstructions were obtained of the axial image sets.

[Series 2: cspine soft (person_name) · axial · 0.25mm/px · z∈[-68,+68]mm · 5 of 102 slices shown]
[im 17/102  soft-tissue]
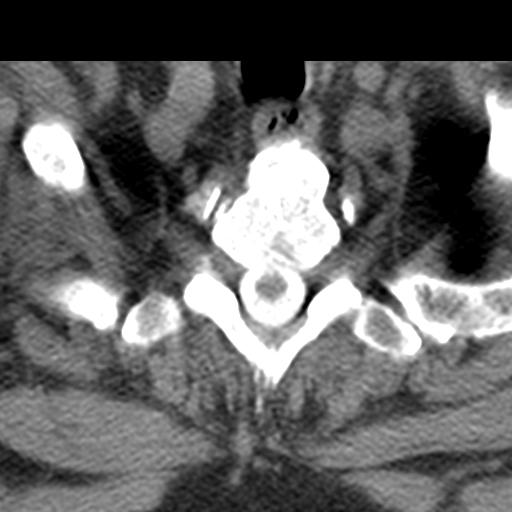
[im 34/102  soft-tissue]
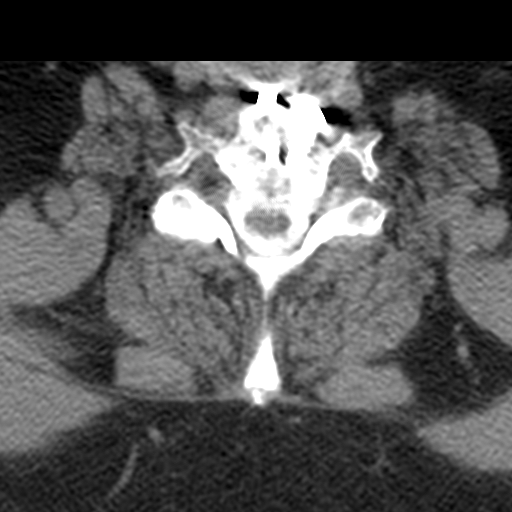
[im 51/102  soft-tissue]
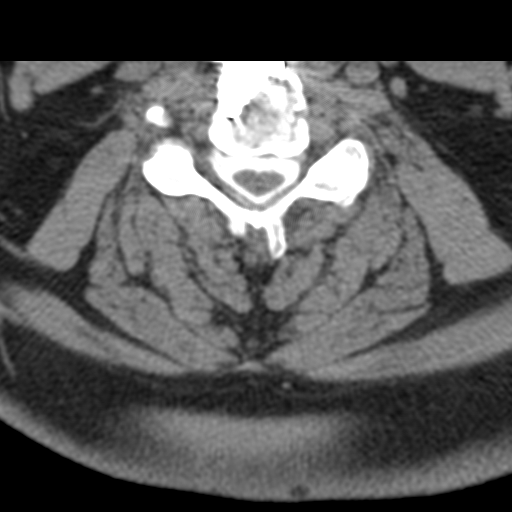
[im 68/102  soft-tissue]
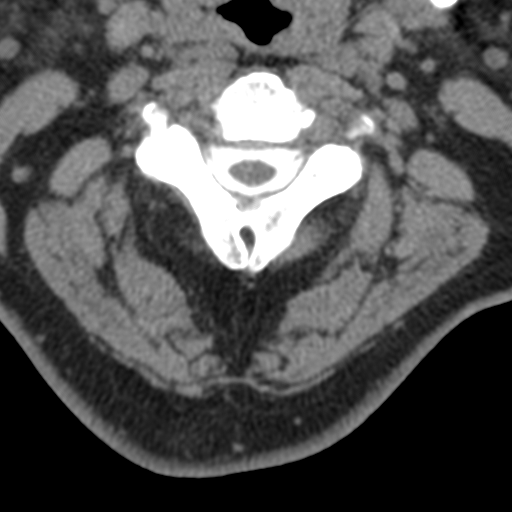
[im 85/102  soft-tissue]
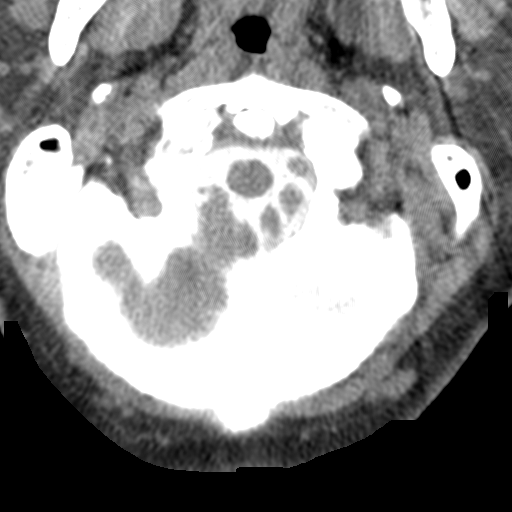

[Series 8: angled axial · axial · 0.29mm/px · z∈[-77,+55]mm · 5 of 101 slices shown, 7 images]
[im 17/101  soft-tissue]
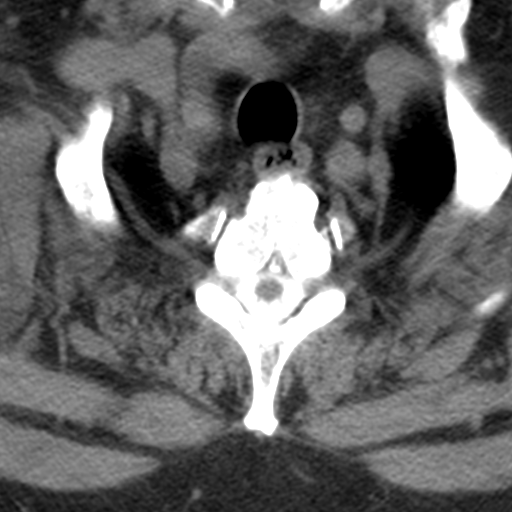
[im 17/101  bone]
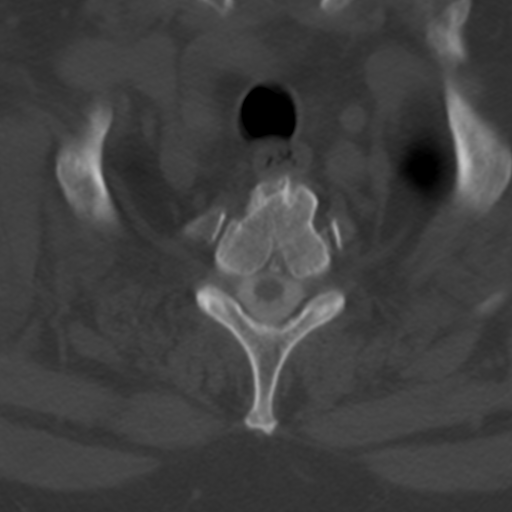
[im 34/101  bone]
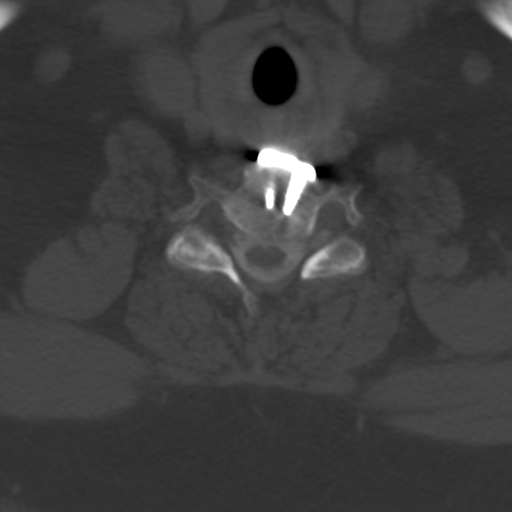
[im 51/101  bone]
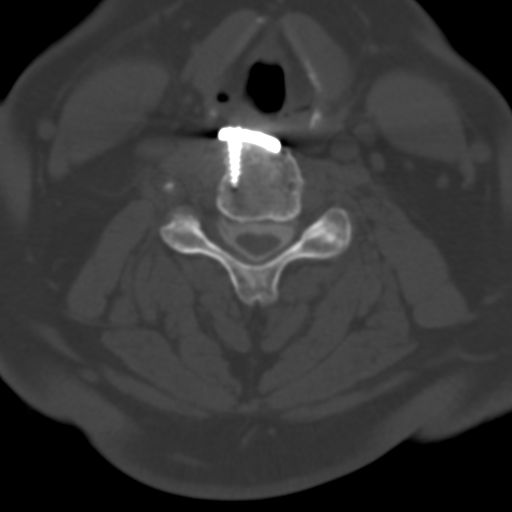
[im 67/101  bone]
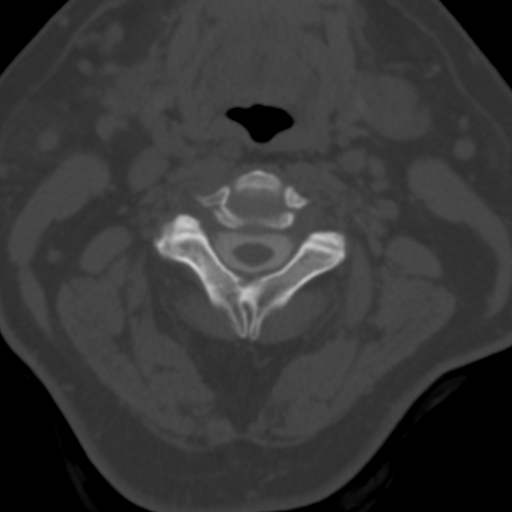
[im 84/101  soft-tissue]
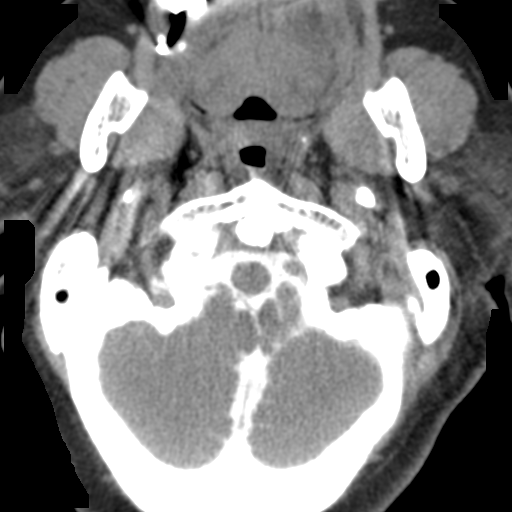
[im 84/101  bone]
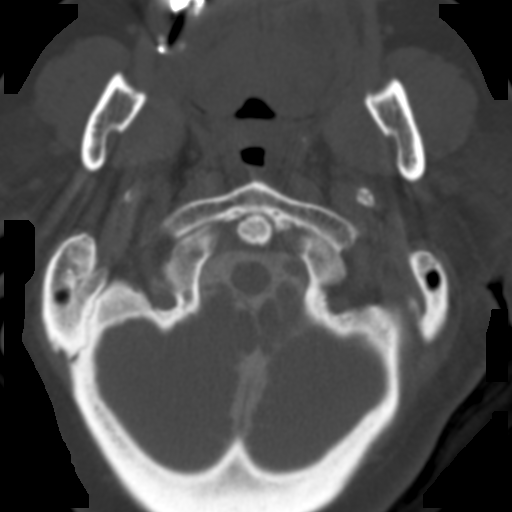

[10 of 14 positions shown; findings below may reference images not displayed]

FLUOROSCOPY TIME:  dictate in minutes and seconds

Radiation Exposure Index (as provided by the fluoroscopic device):
548.8 uGy*m2

PROCEDURE:
LUMBAR PUNCTURE FOR CERVICAL AND LUMBAR MYELOGRAM

CERVICAL AND LUMBAR MYELOGRAM

CT CERVICAL MYELOGRAM

CT LUMBAR MYELOGRAM

After thorough discussion of risks and benefits of the procedure
including bleeding, infection, injury to nerves, blood vessels,
adjacent structures as well as headache and CSF leak, written and
oral informed consent was obtained. Consent was obtained by Dr.
Ki Epperson.

Patient was positioned prone on the fluoroscopy table. Local
anesthesia was provided with 1% lidocaine without epinephrine after
prepped and draped in the usual sterile fashion. Puncture was
performed at L1-2 using a 3 1/2 inch 22-gauge spinal needle via
right paramedian approach. Using a single pass through the dura, the
needle was placed within the thecal sac, with return of clear CSF.
10 mL of Isovue 1-X44 was injected into the thecal sac, with normal
opacification of the nerve roots and cauda equina consistent with
free flow within the subarachnoid space. The patient was then moved
to the trendelenburg position and contrast flowed into the Cervical
spine region.

I personally performed the lumbar puncture and administered the
intrathecal contrast. I also personally supervised acquisition of
the myelogram images.
FINDINGS: CERVICAL AND LUMBAR MYELOGRAM FINDINGS:

Five lumbar type vertebral bodies are present. Early truncation is
noted of the L4 nerve roots bilaterally. Slight retrolisthesis is
again noted at L3-4. Anterolisthesis at L4-5 secondary to bilateral
pars defects is more exaggerated on the upright flexion, and
extension views relative to the prone and supine images than on the
prior study. This does not change significantly with flexion or
extension. Spinal cord stimulator enters at T12-L1.

Subarticular narrowing at L4-5 is more prominent on the left. There
is uncovering of broad-based disc protrusion with the listhesis
associated with standing. Other significant stenosis is present.

Cervical spine demonstrates extension of anterior fusion to now
include C3-4. Previous anterior fusion C4-7 is stable. Hardware is
intact. No abnormal motion is present with flexion or extension.

CT CERVICAL MYELOGRAM FINDINGS:

Cervical spine is imaged from the skull base through T1-2.
Previously noted fusion C4-7 has been extended to include C3-4. C3-4
fusion is solid. Alignment is anatomic. Soft tissues the neck are
unremarkable. The lung apices are clear. Thoracic inlet is normal.
C2-3: Bilateral facet hypertrophy demonstrates some progression
right greater than left. No significant stenosis present. A very
shallow central disc protrusion present.

C3-4: Anterior fusion is now present. Central canal is been
decompressed. Asymmetric left-sided osteophytic ridging partially
effaces the ventral CSF. Persistent uncovertebral spurring is noted,
left greater than right. Moderate foraminal narrowing is present,
left greater than right

C4-5: Solid anterior fusion. No residual recurrent stenosis is
present.

C5-6: Solid anterior fusion. No residual or recurrent stenosis is
present.

C6-7: Solid anterior fusion is present. No residual or recurrent
stenosis is present.

C7-T1: Facet hypertrophy is present with slight progression.
Hypertrophy is worse right than left. No significant disc protrusion
or stenosis is present.

CT LUMBAR MYELOGRAM FINDINGS:

Five non rib-bearing lumbar type vertebral bodies are present.
Vertebral body heights are normal. Slight anterolisthesis is present
at L4-5, cysts significantly less severe than on the upright images.
Alignment is otherwise anatomic. No focal osseous lesions are
present. Bilateral L4 pars defects are again seen.

The soft tissues are unremarkable. No significant adenopathy is
present.

L1-2: Mild facet hypertrophy is present bilaterally. No significant
stenosis is present.

L2-3: Progressive bilateral facet hypertrophy is noted. No
significant disc protrusion or stenosis is present.

L3-4: A mild broad-based disc bulge is present. Facet hypertrophy
and ligamentum flavum thickening has progressed bilaterally. No
significant stenosis is present.

L4-5: Laminectomy is noted. A progressive broad-based disc
protrusion is present. Progressive mild central and moderate
bilateral foraminal stenosis is noted.

L5-S1: Laminectomy is noted. A progressive broad-based disc
protrusion is present. There is chronic loss of disc height. Mild
bilateral subarticular and foraminal stenosis is present.
IMPRESSION: 1. Interval anterior fusion at C3-4 with decompression of the
central canal.
2. Residual left-sided osteophytic ridging at C3-4 results in mild
central and moderate bilateral foraminal stenosis, left greater than
right.
3. Solid anterior fusion at C4-5 and C6-7 without residual or
recurrent stenosis at these levels.
4. Progressive facet hypertrophy at C2-3 and C7-T1 without
significant stenosis.
5. Progressive broad-based disc protrusion at L4-5 with progressive
mild central and moderate bilateral foraminal stenosis despite prior
laminectomy.
6. Marked dynamic anterolisthesis at L4-5 with standing relative to
the supine and prone images.
7. Mild bilateral subarticular and foraminal stenosis at L5-S1.
8. Progressive facet hypertrophy at L2-3 and L3-4 without
significant stenosis.

## 2021-03-12 IMAGING — CR DG MYELOGRAPHY LUMBAR INJ MULTI REGION
6 of 24 series · 6 of 24 positions shown · non-contrast
Comparison: none

CLINICAL DATA: Bilateral upper extremity numbness. New onset of
left groin pain.
TECHNIQUE: Contiguous axial images were obtained through the Cervical and
Lumbar spine after the intrathecal infusion of infusion. Coronal and
sagittal reconstructions were obtained of the axial image sets.

[vasc adipose (1 of 3)]
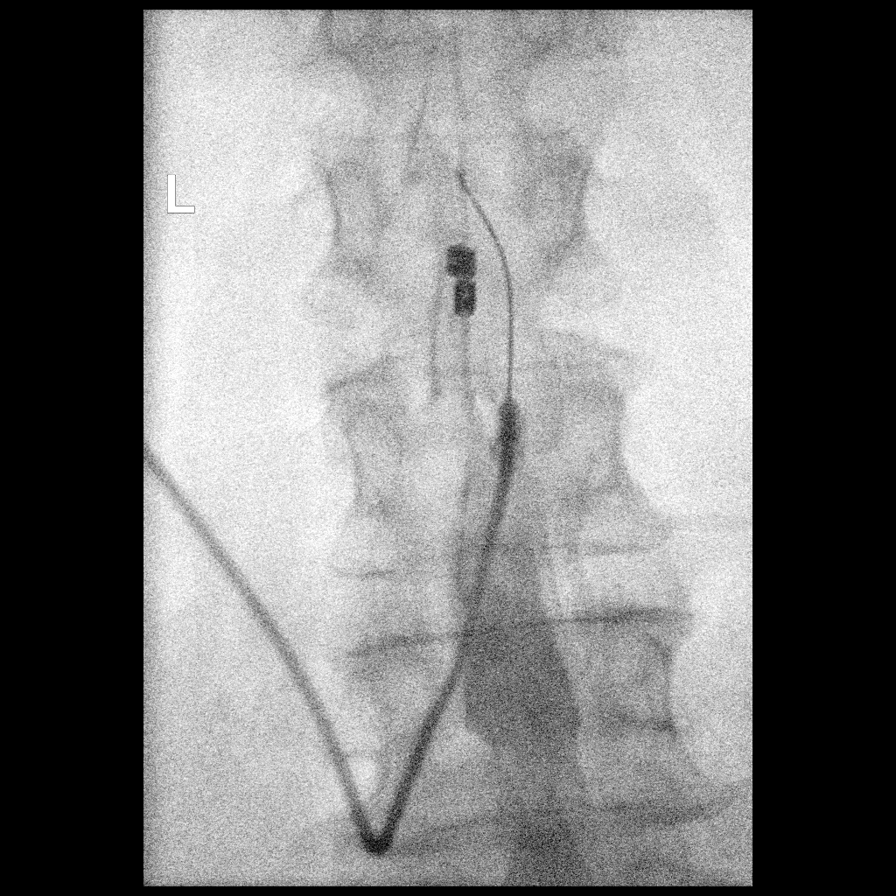

[w cervical spine lat]
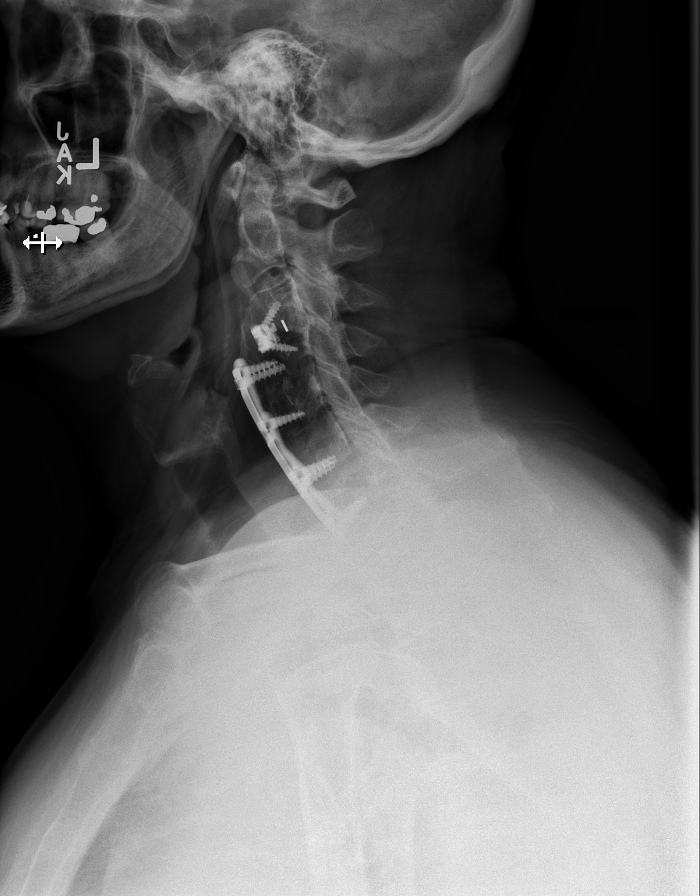

[w cervical spine flexion]
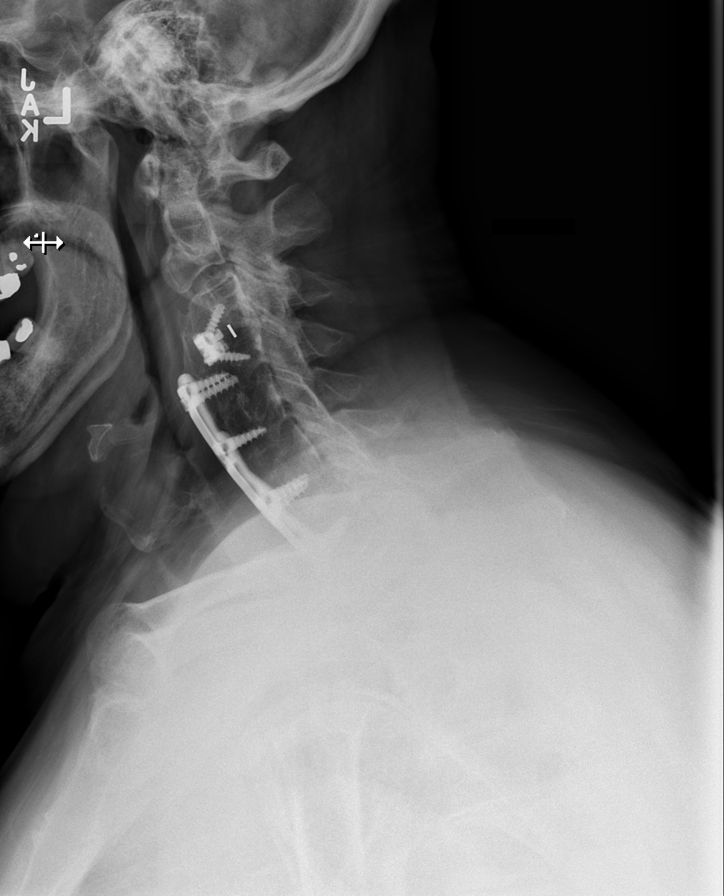

[vasc adipose (2 of 3)]
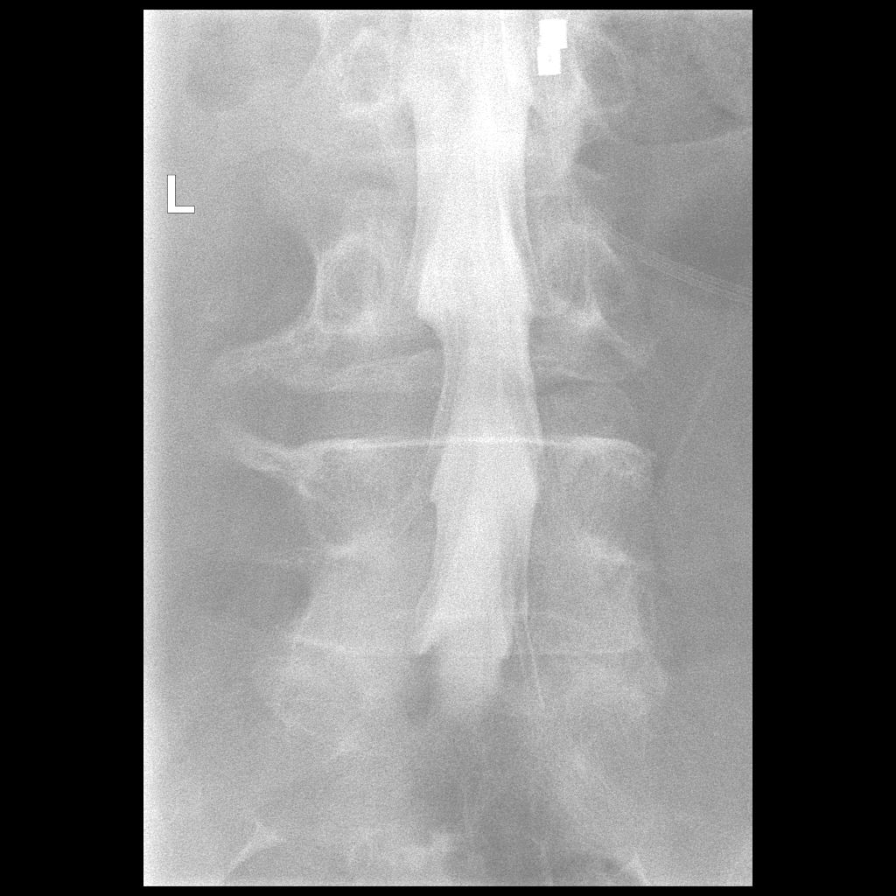

[vasc adipose (3 of 3)]
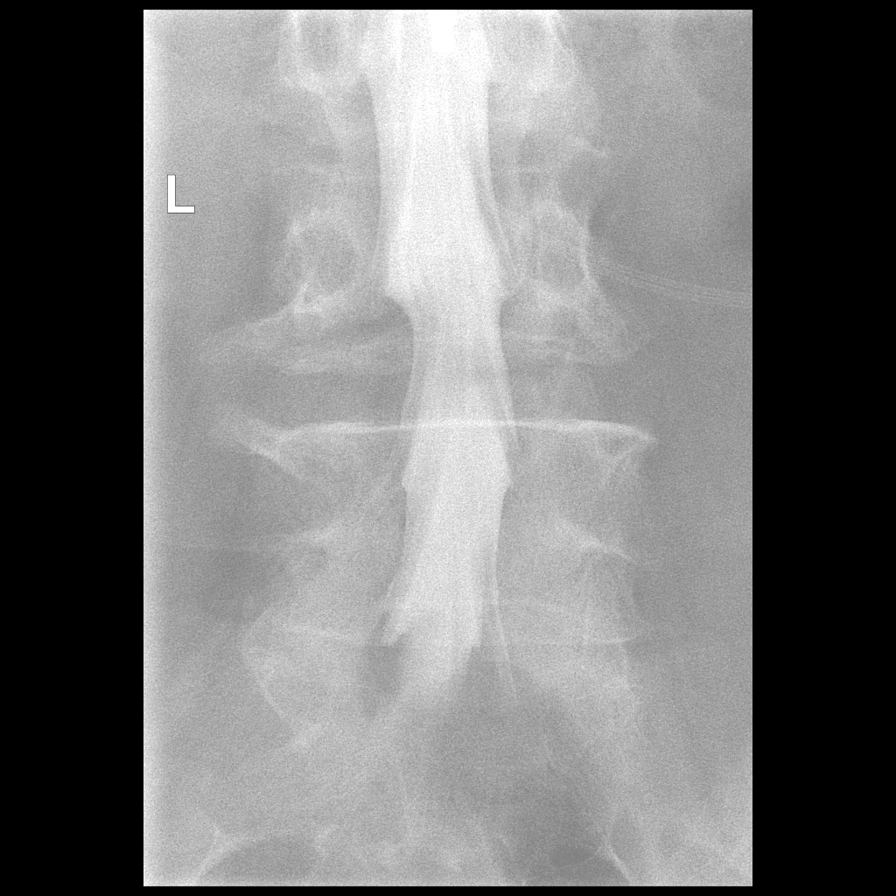

[w cervical spine extension]
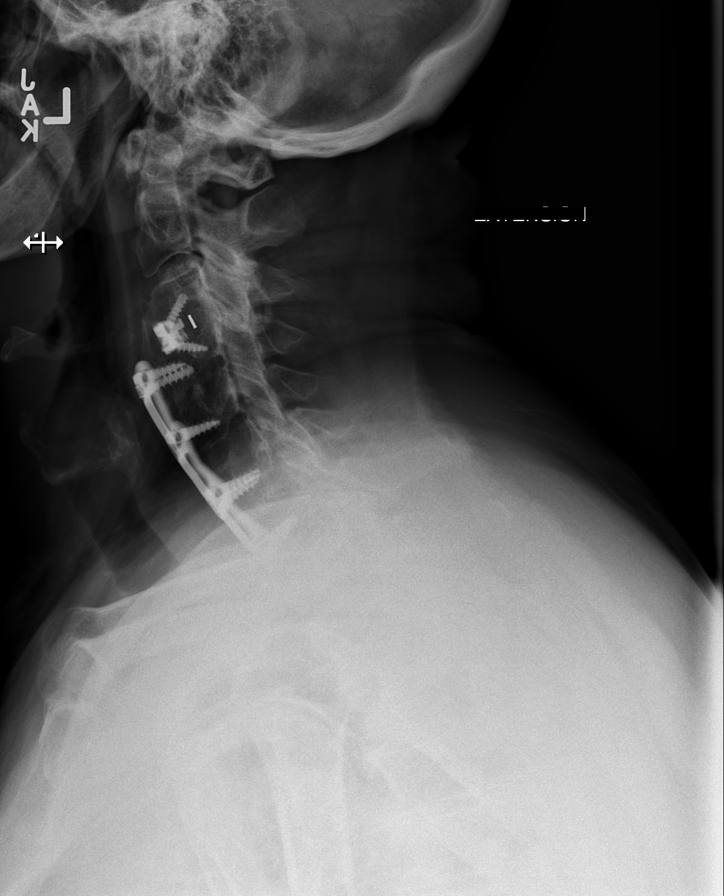

[6 of 24 positions shown; findings below may reference images not displayed]

FLUOROSCOPY TIME:  dictate in minutes and seconds

Radiation Exposure Index (as provided by the fluoroscopic device):
548.8 uGy*m2

PROCEDURE:
LUMBAR PUNCTURE FOR CERVICAL AND LUMBAR MYELOGRAM

CERVICAL AND LUMBAR MYELOGRAM

CT CERVICAL MYELOGRAM

CT LUMBAR MYELOGRAM

After thorough discussion of risks and benefits of the procedure
including bleeding, infection, injury to nerves, blood vessels,
adjacent structures as well as headache and CSF leak, written and
oral informed consent was obtained. Consent was obtained by Dr.
Ki Epperson.

Patient was positioned prone on the fluoroscopy table. Local
anesthesia was provided with 1% lidocaine without epinephrine after
prepped and draped in the usual sterile fashion. Puncture was
performed at L1-2 using a 3 1/2 inch 22-gauge spinal needle via
right paramedian approach. Using a single pass through the dura, the
needle was placed within the thecal sac, with return of clear CSF.
10 mL of Isovue [id number] was injected into the thecal sac, with normal
opacification of the nerve roots and cauda equina consistent with
free flow within the subarachnoid space. The patient was then moved
to the trendelenburg position and contrast flowed into the Cervical
spine region.

I personally performed the lumbar puncture and administered the
intrathecal contrast. I also personally supervised acquisition of
the myelogram images.
FINDINGS: CERVICAL AND LUMBAR MYELOGRAM FINDINGS:

Five lumbar type vertebral bodies are present. Early truncation is
noted of the L4 nerve roots bilaterally. Slight retrolisthesis is
again noted at L3-4. Anterolisthesis at L4-5 secondary to bilateral
pars defects is more exaggerated on the upright flexion, and
extension views relative to the prone and supine images than on the
prior study. This does not change significantly with flexion or
extension. Spinal cord stimulator enters at T12-L1.

Subarticular narrowing at L4-5 is more prominent on the left. There
is uncovering of broad-based disc protrusion with the listhesis
associated with standing. Other significant stenosis is present.

Cervical spine demonstrates extension of anterior fusion to now
include C3-4. Previous anterior fusion C4-7 is stable. Hardware is
intact. No abnormal motion is present with flexion or extension.

CT CERVICAL MYELOGRAM FINDINGS:

Cervical spine is imaged from the skull base through T1-2.
Previously noted fusion C4-7 has been extended to include C3-4. C3-4
fusion is solid. Alignment is anatomic. Soft tissues the neck are
unremarkable. The lung apices are clear. Thoracic inlet is normal.
C2-3: Bilateral facet hypertrophy demonstrates some progression
right greater than left. No significant stenosis present. A very
shallow central disc protrusion present.

C3-4: Anterior fusion is now present. Central canal is been
decompressed. Asymmetric left-sided osteophytic ridging partially
effaces the ventral CSF. Persistent uncovertebral spurring is noted,
left greater than right. Moderate foraminal narrowing is present,
left greater than right

C4-5: Solid anterior fusion. No residual recurrent stenosis is
present.

C5-6: Solid anterior fusion. No residual or recurrent stenosis is
present.

C6-7: Solid anterior fusion is present. No residual or recurrent
stenosis is present.

C7-T1: Facet hypertrophy is present with slight progression.
Hypertrophy is worse right than left. No significant disc protrusion
or stenosis is present.

CT LUMBAR MYELOGRAM FINDINGS:

Five non rib-bearing lumbar type vertebral bodies are present.
Vertebral body heights are normal. Slight anterolisthesis is present
at L4-5, cysts significantly less severe than on the upright images.
Alignment is otherwise anatomic. No focal osseous lesions are
present. Bilateral L4 pars defects are again seen.

The soft tissues are unremarkable. No significant adenopathy is
present.

L1-2: Mild facet hypertrophy is present bilaterally. No significant
stenosis is present.

L2-3: Progressive bilateral facet hypertrophy is noted. No
significant disc protrusion or stenosis is present.

L3-4: A mild broad-based disc bulge is present. Facet hypertrophy
and ligamentum flavum thickening has progressed bilaterally. No
significant stenosis is present.

L4-5: Laminectomy is noted. A progressive broad-based disc
protrusion is present. Progressive mild central and moderate
bilateral foraminal stenosis is noted.

L5-S1: Laminectomy is noted. A progressive broad-based disc
protrusion is present. There is chronic loss of disc height. Mild
bilateral subarticular and foraminal stenosis is present.
IMPRESSION: 1. Interval anterior fusion at C3-4 with decompression of the
central canal.
2. Residual left-sided osteophytic ridging at C3-4 results in mild
central and moderate bilateral foraminal stenosis, left greater than
right.
3. Solid anterior fusion at C4-5 and C6-7 without residual or
recurrent stenosis at these levels.
4. Progressive facet hypertrophy at C2-3 and C7-T1 without
significant stenosis.
5. Progressive broad-based disc protrusion at L4-5 with progressive
mild central and moderate bilateral foraminal stenosis despite prior
laminectomy.
6. Marked dynamic anterolisthesis at L4-5 with standing relative to
the supine and prone images.
7. Mild bilateral subarticular and foraminal stenosis at L5-S1.
8. Progressive facet hypertrophy at L2-3 and L3-4 without
significant stenosis.

## 2021-04-13 DIAGNOSIS — M47812 Spondylosis without myelopathy or radiculopathy, cervical region: Secondary | ICD-10-CM | POA: Diagnosis not present

## 2021-04-14 ENCOUNTER — Ambulatory Visit: Payer: Medicare PPO | Admitting: Pulmonary Disease

## 2021-04-14 ENCOUNTER — Other Ambulatory Visit: Payer: Self-pay | Admitting: Family Medicine

## 2021-04-14 ENCOUNTER — Other Ambulatory Visit: Payer: Self-pay

## 2021-04-14 DIAGNOSIS — I10 Essential (primary) hypertension: Secondary | ICD-10-CM

## 2021-04-14 MED ORDER — HYDROCHLOROTHIAZIDE 25 MG PO TABS: 25.0000 mg | ORAL_TABLET | Freq: Every day | ORAL | 0 refills | Status: DC

## 2021-04-14 NOTE — Telephone Encounter (Signed)
Please advise. Valacyclover is not on the current med list.

## 2021-04-16 ENCOUNTER — Other Ambulatory Visit: Payer: Self-pay

## 2021-04-16 ENCOUNTER — Ambulatory Visit: Payer: Medicare PPO | Admitting: Pulmonary Disease

## 2021-04-16 ENCOUNTER — Encounter: Payer: Self-pay | Admitting: Pulmonary Disease

## 2021-04-16 VITALS — BP 128/86 | HR 110 | Ht 65.0 in | Wt 285.0 lb

## 2021-04-16 DIAGNOSIS — R0982 Postnasal drip: Secondary | ICD-10-CM

## 2021-04-16 DIAGNOSIS — R053 Chronic cough: Secondary | ICD-10-CM

## 2021-04-16 DIAGNOSIS — K219 Gastro-esophageal reflux disease without esophagitis: Secondary | ICD-10-CM | POA: Diagnosis not present

## 2021-04-16 MED ORDER — IPRATROPIUM BROMIDE 0.03 % NA SOLN: 2.0000 | Freq: Two times a day (BID) | NASAL | 12 refills | Status: DC

## 2021-04-16 MED ORDER — FLUTICASONE PROPIONATE 50 MCG/ACT NA SUSP: 1.0000 | Freq: Every day | NASAL | 2 refills | Status: DC

## 2021-04-16 NOTE — Progress Notes (Signed)
Synopsis: Referred in January 2020 by [name], PA for Chronic Cough  Subjective:   PATIENT ID: Taylor Ritter GENDER: female DOB: Jul 28, 1960, MRN: [id number]  HPI  Chief Complaint  Patient presents with   Follow-up    F/U on cough. Productive cough with thick green, yellow phlegm. Increased wheezing and SOB.    Taylor Ritter is a 61 year old woman, never smoker with history of esophageal stricture, GERD, seasonal allergies and chronic neck/back pain who returns to pulmonary clinic for chronic cough.   She continues to have cough despite using fluticasone and ipratropium nasal sprays for multiple months before self discontinuing. She was also started on famotidine 20mg  at bedtime and continued on 40mg  nexium daily for GERD along with sleeping with the head of her bed elevated.   MBS 05/04/20 was unremarkable. Patient reports that she gets choked during eating intermittently. She can have coughing fits at night that causes her to gag at times. She habitually clears her sinuses and throat during our interview today.   She reports shortness of breath and intermittent wheezing. She has been on symbicort for a while and denies any benefit.   Past Medical History:  Diagnosis Date   Allergy    takes Allegra daily and uses nasal spray daily.Takes Benadryl as needed.Uses eye drops daily   Anemia    B 12 shots monthly   Anxiety    takes Xanax as needed   Arthritis    osteoporosis   Asthma    Proventil inhaler as needed   Chronic back pain    DDD and stenosis   Complication of anesthesia    pt states she was woke up during surgery   Depression    takes Wellbutrin daily   Fibromyalgia    GERD (gastroesophageal reflux disease)    takes Nexium daily   Headache(784.0)    takes Topamax daily as needed   History of bronchitis    2016   History of colon polyps    benign   History of vertigo    took Meclizine several yrs ago   Hypertension    followed by [place]  [facility]   IBS (irritable bowel syndrome)    takes LInzess daily   Insomnia    Joint pain    Muscle spasm    takes Flexeril daily as needed   Neuromuscular disorder (HCC)    numbness arms legs,feet    Nocturia    Osteoporosis    Pain    takes Keppra and Topamax daily   Peripheral edema    takes HCTZ daily   Sleep apnea    has a CPAP but doesn't wear often   Urinary frequency    Urinary incontinence    Urinary urgency      Family History  Problem Relation Age of Onset   Arthritis Mother    Hypertension Mother    Kidney disease Mother    COPD Mother    Heart disease Mother 41       CAD   Diabetes Mother    Mental illness Mother    Arthritis Father    Diabetes Father    Heart disease Father    Cancer Father        prostate cancer   Mental illness Father        Alzheimers Dementia   Cancer Maternal Grandmother        brain tumor   Cancer Maternal Grandfather  lung   Diabetes Maternal Grandfather    Early death Brother    Anesthesia problems Neg Hx    Hypotension Neg Hx    Malignant hyperthermia Neg Hx    Pseudochol deficiency Neg Hx      Social History   Socioeconomic History   Marital status: Married    Spouse name: Not on file   Number of children: Not on file   Years of education: Not on file   Highest education level: Not on file  Occupational History   Not on file  Tobacco Use   Smoking status: Never   Smokeless tobacco: Never  Vaping Use   Vaping Use: Never used  Substance and Sexual Activity   Alcohol use: No   Drug use: No   Sexual activity: Not on file  Other Topics Concern   Not on file  Social History Narrative   Not on file   Social Determinants of Health   Financial Resource Strain: Low Risk    Difficulty of Paying Living Expenses: Not hard at all  Food Insecurity: No Food Insecurity   Worried About Charity fundraiser in the Last Year: Never true   [place] in the Last Year: Never true   Transportation Needs: No Transportation Needs   Lack of Transportation (Medical): No   Lack of Transportation (Non-Medical): No  Physical Activity: Inactive   Days of Exercise per Week: 0 days   Minutes of Exercise per Session: 0 min  Stress: No Stress Concern Present   Feeling of Stress : Not at all  Social Connections: Moderately Isolated   Frequency of Communication with Friends and Family: Twice a week   Frequency of Social Gatherings with Friends and Family: Once a week   Attends Religious Services: Never   Marine scientist or Organizations: No   Attends Music therapist: Never   Marital Status: Married  Human resources officer Violence: Not At Risk   Fear of Current or Ex-Partner: No   Emotionally Abused: No   Physically Abused: No   Sexually Abused: No     Allergies  Allergen Reactions   Other Other (See Comments)    Nylon sutures had to remove to allow healing  Vicryl sutures - do not use   Percocet [Oxycodone-Acetaminophen] Other (See Comments)    "makes her crazy"   Amoxicillin-Pot Clavulanate Other (See Comments)    Unknown reaction   Codeine Hives   Dye Fdc Red [Red Dye] Hives and Other (See Comments)    Scan dye, broke out in hives all over once with a neck scan   Morphine And Related Hives and Itching   Vesicare [Solifenacin Succinate] Itching     Outpatient Medications Prior to Visit  Medication Sig Dispense Refill   albuterol (VENTOLIN HFA) 108 (90 Base) MCG/ACT inhaler Inhale 1-2 puffs into the lungs every 6 (six) hours as needed for wheezing or shortness of breath.     ALPRAZolam (XANAX) 1 MG tablet TAKE 1 TABLET THREE TIMES DAILY AS NEEDED FOR ANXIETY 30 tablet 0   buPROPion (WELLBUTRIN XL) 150 MG 24 hr tablet Take 3 tabs daily.  Please schedule a physical for more refills. thanks 270 tablet 3   cyanocobalamin (,VITAMIN B-12,) 1000 MCG/ML injection Inject 1 mL (1,000 mcg total) into the muscle every 30 (thirty) days. 10 mL 1    cyclobenzaprine (FLEXERIL) 5 MG tablet Take 5 mg by mouth 2 (two) times daily as needed for muscle spasms.  diphenhydrAMINE (BENADRYL) 50 MG capsule Take 50 mg by mouth every 6 (six) hours as needed for allergies.     EPINEPHrine 0.3 mg/0.3 mL IJ SOAJ injection Inject 0.3 mg into the muscle once.     Estradiol 10 MCG TABS vaginal tablet Place 1 tablet (10 mcg total) vaginally 2 (two) times a week. (Patient taking differently: Place 1 tablet vaginally daily as needed (vaginal dryness).) 24 tablet 3   famotidine (PEPCID) 10 MG tablet Take 20 mg by mouth at bedtime.     fexofenadine (ALLEGRA) 180 MG tablet Take 180 mg by mouth daily.     hydrochlorothiazide (HYDRODIURIL) 25 MG tablet Take 1 tablet (25 mg total) by mouth daily. 90 tablet 0   ibuprofen (ADVIL) 200 MG tablet Take 800 mg by mouth every 8 (eight) hours as needed (for pain.).     levETIRAcetam (KEPPRA) 500 MG tablet Take 1,000 mg by mouth at bedtime.      linaclotide (LINZESS) 290 MCG CAPS capsule Take 580 mcg by mouth in the morning.     montelukast (SINGULAIR) 10 MG tablet TAKE 1 TABLET AT BEDTIME (APPOINTMENT IS NEEDED FOR REFILLS) 90 tablet 0   Multiple Vitamin (MULTIVITAMIN WITH MINERALS) TABS tablet Take 1 tablet by mouth daily. One A Day for Women     NEXIUM 40 MG capsule TAKE 1 CAPSULE TWICE DAILY 180 capsule 3   nystatin (MYCOSTATIN/NYSTOP) powder Apply topically 4 (four) times daily. (Patient taking differently: Apply 1 application topically 4 (four) times daily as needed (yeast).) 30 g 1   nystatin ointment (MYCOSTATIN) APPLY 1 APPLICATION TOPICALLY 2 (TWO) TIMES DAILY AS NEEDED 30 g 0   pilocarpine (SALAGEN) 5 MG tablet Take 5 mg by mouth 2 (two) times daily as needed (dryness).     potassium chloride (KLOR-CON) 10 MEQ tablet Take 2 tablets by mouth once a day. 180 tablet 3   potassium gluconate 595 (99 K) MG TABS tablet Take 595 mg by mouth in the morning.     topiramate (TOPAMAX) 100 MG tablet Take 200 mg by mouth at  bedtime.      Vilazodone HCl (VIIBRYD) 40 MG TABS Take 1 tablet (40 mg total) by mouth daily. 90 tablet 3   vitamin B-12 (CYANOCOBALAMIN) 1000 MCG tablet Take 1,000 mcg by mouth in the morning.     azelastine (ASTELIN) 0.1 % nasal spray Place 1 spray into both nostrils 2 (two) times daily as needed for allergies. Use in each nostril as directed     budesonide-formoterol (SYMBICORT) 160-4.5 MCG/ACT inhaler Inhale 2 puffs into the lungs 2 (two) times daily as needed (respiratory issues.).     No facility-administered medications prior to visit.    Review of Systems  Constitutional:  Negative for chills, fever, malaise/fatigue and weight loss.  HENT:  Positive for congestion. Negative for sinus pain and sore throat.   Eyes: Negative.   Respiratory:  Positive for cough, sputum production, shortness of breath and wheezing. Negative for hemoptysis.   Cardiovascular:  Positive for chest pain. Negative for palpitations, orthopnea, claudication and leg swelling.  Gastrointestinal:  Positive for heartburn. Negative for abdominal pain, nausea and vomiting.       Dysphagia  Genitourinary: Negative.   Musculoskeletal:  Positive for joint pain. Negative for myalgias.  Skin:  Negative for rash.  Neurological:  Positive for headaches. Negative for weakness.  Endo/Heme/Allergies: Negative.   Psychiatric/Behavioral:  Positive for depression.    Objective:   Vitals:   04/16/21 1340  BP: 128/86  Pulse: (!) 110  SpO2: 98%  Weight: 285 lb (129.3 kg)  Height: 5\' 5"  (1.651 m)     Physical Exam Constitutional:      General: She is not in acute distress.    Appearance: She is obese. She is not ill-appearing.     Comments: Coughing throughout exam  HENT:     Head: Normocephalic and atraumatic.     Nose: No congestion.     Mouth/Throat:     Mouth: Mucous membranes are moist.     Pharynx: Oropharynx is clear.  Eyes:     General: No scleral icterus.    Conjunctiva/sclera: Conjunctivae normal.      Pupils: Pupils are equal, round, and reactive to light.  Cardiovascular:     Rate and Rhythm: Normal rate and regular rhythm.     Pulses: Normal pulses.     Heart sounds: Normal heart sounds. No murmur heard. Pulmonary:     Effort: Pulmonary effort is normal.     Breath sounds: Normal breath sounds. No wheezing, rhonchi or rales.  Abdominal:     General: Bowel sounds are normal.     Palpations: Abdomen is soft.  Musculoskeletal:     Right lower leg: No edema.     Left lower leg: No edema.  Lymphadenopathy:     Cervical: No cervical adenopathy.  Skin:    General: Skin is warm and dry.  Neurological:     General: No focal deficit present.     Mental Status: She is alert.  Psychiatric:        Mood and Affect: Mood normal.        Behavior: Behavior normal.        Thought Content: Thought content normal.        Judgment: Judgment normal.   CBC    Component Value Date/Time   WBC 9.4 09/10/2019 1505   RBC 4.31 09/10/2019 1505   HGB 14.3 12/18/2020 0956   HGB 12.4 12/24/2018 1422   HCT 42.0 12/18/2020 0956   HCT 38.0 12/24/2018 1422   PLT 265 09/10/2019 1505   PLT 258 12/24/2018 1422   MCV 96.3 09/10/2019 1505   MCV 93 12/24/2018 1422   MCH 30.9 09/10/2019 1505   MCHC 32.0 09/10/2019 1505   RDW 12.9 09/10/2019 1505   RDW 12.4 12/24/2018 1422   LYMPHSABS 2.2 04/07/2017 1109   MONOABS 0.4 04/07/2017 1109   EOSABS 0.2 04/07/2017 1109   BASOSABS 0.1 04/07/2017 1109   BMP Latest Ref Rng & Units 12/25/2020 12/18/2020 03/27/2020  Glucose 70 - 99 mg/dL 122(H) 107(H) 93  BUN 6 - 23 mg/dL 13 7(L) 12  Creatinine 0.40 - 1.20 mg/dL 1.26(H) 1.00 1.22(H)  BUN/Creat Ratio 9 - 23 - - -  Sodium 135 - 145 mEq/L 140 139 142  Potassium 3.5 - 5.1 mEq/L 3.2(L) 2.9(L) 4.0  Chloride 96 - 112 mEq/L 103 101 107  CO2 19 - 32 mEq/L 28 - 26  Calcium 8.4 - 10.5 mg/dL 9.2 - 9.7   Chest imaging: CXR 03/27/2020 Mild bronchitic changes without infiltrate.  PFT: No flowsheet data found.   Assessment  & Plan:   Chronic cough - Plan: Ambulatory referral to ENT  Post-nasal drainage - Plan: Ambulatory referral to ENT, fluticasone (FLONASE) 50 MCG/ACT nasal spray, ipratropium (ATROVENT) 0.03 % nasal spray  Gastroesophageal reflux disease without esophagitis  Discussion: Deshawnda Acrey is a 61 year old woman, never smoker with history of esophageal stricture, GERD, seasonal allergies and chronic neck/back  pain who returns to pulmonary clinic for chronic cough.   Her cough appears to be related to post-nasal drainage and uncontrolled GERD. Possible reactive airways disease but she has not responded to ICS/LABA therapy. She is to stop symbicort and monitor for any changes in her cough and respiratory symptoms. We will check pulmonary function tests at next follow up visit.  She is to resume flonase nasal spray daily along with ipratropium nasal spray twice daily. We will refer her to ENT for further evaluation.  I have reached out to Dr. Benson Norway of GI about having her scheduled for evaluation of her dysphagia and GERD.   Her cough is also in part due to habitual clearing of throat and sinuses as demonstrated during our visit today. Encouraged her to suppress her urge to cough and clear her sinuses as much as possible.   Follow up in 4 months with PFTs.   Freda Jackson, MD [facility] Office: [phone]    Current Outpatient Medications:    albuterol (VENTOLIN HFA) 108 (90 Base) MCG/ACT inhaler, Inhale 1-2 puffs into the lungs every 6 (six) hours as needed for wheezing or shortness of breath., Disp: , Rfl:    ALPRAZolam (XANAX) 1 MG tablet, TAKE 1 TABLET THREE TIMES DAILY AS NEEDED FOR ANXIETY, Disp: 30 tablet, Rfl: 0   buPROPion (WELLBUTRIN XL) 150 MG 24 hr tablet, Take 3 tabs daily.  Please schedule a physical for more refills. thanks, Disp: 270 tablet, Rfl: 3   cyanocobalamin (,VITAMIN B-12,) 1000 MCG/ML injection, Inject 1 mL (1,000 mcg total) into the muscle  every 30 (thirty) days., Disp: 10 mL, Rfl: 1   cyclobenzaprine (FLEXERIL) 5 MG tablet, Take 5 mg by mouth 2 (two) times daily as needed for muscle spasms., Disp: , Rfl:    diphenhydrAMINE (BENADRYL) 50 MG capsule, Take 50 mg by mouth every 6 (six) hours as needed for allergies., Disp: , Rfl:    EPINEPHrine 0.3 mg/0.3 mL IJ SOAJ injection, Inject 0.3 mg into the muscle once., Disp: , Rfl:    Estradiol 10 MCG TABS vaginal tablet, Place 1 tablet (10 mcg total) vaginally 2 (two) times a week. (Patient taking differently: Place 1 tablet vaginally daily as needed (vaginal dryness).), Disp: 24 tablet, Rfl: 3   famotidine (PEPCID) 10 MG tablet, Take 20 mg by mouth at bedtime., Disp: , Rfl:    fexofenadine (ALLEGRA) 180 MG tablet, Take 180 mg by mouth daily., Disp: , Rfl:    fluticasone (FLONASE) 50 MCG/ACT nasal spray, Place 1 spray into both nostrils daily., Disp: 16 g, Rfl: 2   hydrochlorothiazide (HYDRODIURIL) 25 MG tablet, Take 1 tablet (25 mg total) by mouth daily., Disp: 90 tablet, Rfl: 0   ibuprofen (ADVIL) 200 MG tablet, Take 800 mg by mouth every 8 (eight) hours as needed (for pain.)., Disp: , Rfl:    ipratropium (ATROVENT) 0.03 % nasal spray, Place 2 sprays into both nostrils every 12 (twelve) hours., Disp: 30 mL, Rfl: 12   levETIRAcetam (KEPPRA) 500 MG tablet, Take 1,000 mg by mouth at bedtime. , Disp: , Rfl:    linaclotide (LINZESS) 290 MCG CAPS capsule, Take 580 mcg by mouth in the morning., Disp: , Rfl:    montelukast (SINGULAIR) 10 MG tablet, TAKE 1 TABLET AT BEDTIME (APPOINTMENT IS NEEDED FOR REFILLS), Disp: 90 tablet, Rfl: 0   Multiple Vitamin (MULTIVITAMIN WITH MINERALS) TABS tablet, Take 1 tablet by mouth daily. One A Day for Women, Disp: , Rfl:    NEXIUM  40 MG capsule, TAKE 1 CAPSULE TWICE DAILY, Disp: 180 capsule, Rfl: 3   nystatin (MYCOSTATIN/NYSTOP) powder, Apply topically 4 (four) times daily. (Patient taking differently: Apply 1 application topically 4 (four) times daily as needed  (yeast).), Disp: 30 g, Rfl: 1   nystatin ointment (MYCOSTATIN), APPLY 1 APPLICATION TOPICALLY 2 (TWO) TIMES DAILY AS NEEDED, Disp: 30 g, Rfl: 0   pilocarpine (SALAGEN) 5 MG tablet, Take 5 mg by mouth 2 (two) times daily as needed (dryness)., Disp: , Rfl:    potassium chloride (KLOR-CON) 10 MEQ tablet, Take 2 tablets by mouth once a day., Disp: 180 tablet, Rfl: 3   potassium gluconate 595 (99 K) MG TABS tablet, Take 595 mg by mouth in the morning., Disp: , Rfl:    topiramate (TOPAMAX) 100 MG tablet, Take 200 mg by mouth at bedtime. , Disp: , Rfl:    Vilazodone HCl (VIIBRYD) 40 MG TABS, Take 1 tablet (40 mg total) by mouth daily., Disp: 90 tablet, Rfl: 3   vitamin B-12 (CYANOCOBALAMIN) 1000 MCG tablet, Take 1,000 mcg by mouth in the morning., Disp: , Rfl:

## 2021-04-16 NOTE — Patient Instructions (Addendum)
Recommend resuming fluticasone nasal spray, 1 spray per nostril daily  Use ipratropium nasal spray, 2 sprays per nostril twice daily  Stop symbicort inhaler and monitor for changes in your cough and breathing.  Recommend suppressing the urge to clear your nose/throat and also suppress the urge to cough to rest your throat.   We will refer you to ENT for the cough  We will reach out to Dr. Benson Norway about evaluating you for your trouble swallowing and for an EGD.

## 2021-04-17 ENCOUNTER — Encounter: Payer: Self-pay | Admitting: Pulmonary Disease

## 2021-04-20 ENCOUNTER — Other Ambulatory Visit: Payer: Self-pay | Admitting: *Deleted

## 2021-04-20 DIAGNOSIS — B372 Candidiasis of skin and nail: Secondary | ICD-10-CM

## 2021-04-21 DIAGNOSIS — R1310 Dysphagia, unspecified: Secondary | ICD-10-CM | POA: Diagnosis not present

## 2021-04-21 DIAGNOSIS — J329 Chronic sinusitis, unspecified: Secondary | ICD-10-CM | POA: Diagnosis not present

## 2021-04-21 DIAGNOSIS — K219 Gastro-esophageal reflux disease without esophagitis: Secondary | ICD-10-CM | POA: Diagnosis not present

## 2021-04-23 ENCOUNTER — Other Ambulatory Visit: Payer: Self-pay | Admitting: Gastroenterology

## 2021-04-27 ENCOUNTER — Telehealth: Payer: Self-pay | Admitting: *Deleted

## 2021-04-27 NOTE — Telephone Encounter (Signed)
PA done via cover my meds for nystatin powder. Per [employer] "PA Case: [id number], Status: Approved, Coverage Starts on: 03/21/2021 12:00:00 AM, Coverage Ends on: 03/20/2022 12:00:00 AM. Questions? Contact [phone]."

## 2021-05-04 ENCOUNTER — Ambulatory Visit (INDEPENDENT_AMBULATORY_CARE_PROVIDER_SITE_OTHER): Payer: Medicare PPO

## 2021-05-04 VITALS — Ht 66.0 in | Wt 280.0 lb

## 2021-05-04 DIAGNOSIS — Z0000 Encounter for general adult medical examination without abnormal findings: Secondary | ICD-10-CM

## 2021-05-04 NOTE — Progress Notes (Signed)
I connected with [name] today by telephone and verified that I am speaking with the correct person using two identifiers. Location patient: home Location provider: work Persons participating in the virtual visit: [name], [name].   I discussed the limitations, risks, security and privacy concerns of performing an evaluation and management service by telephone and the availability of in person appointments. I also discussed with the patient that there may be a patient responsible charge related to this service. The patient expressed understanding and verbally consented to this telephonic visit.    Interactive audio and video telecommunications were attempted between this provider and patient, however failed, due to patient having technical difficulties OR patient did not have access to video capability.  We continued and completed visit with audio only.     Vital signs may be patient reported or missing.  Subjective:   Taylor Ritter is a 61 y.o. female who presents for Medicare Annual (Subsequent) preventive examination.  Review of Systems     Cardiac Risk Factors include: hypertension;obesity (BMI >30kg/m2)     Objective:    Today's Vitals   05/04/21 1131  Weight: 280 lb (127 kg)  Height: 5\' 6"  (1.676 m)   Body mass index is 45.19 kg/m.  Advanced Directives 05/04/2021 12/18/2020 05/26/2020 09/10/2019 10/06/2017 10/05/2017 12/11/2015  Does Patient Have a Medical Advance Directive? No No No No No No No  Would patient like information on creating a medical advance directive? - No - Patient declined No - Patient declined - No - Patient declined No - Patient declined No - patient declined information  Pre-existing out of facility DNR order (yellow form or pink MOST form) - - - - - - -    Current Medications (verified) Outpatient Encounter Medications as of 05/04/2021  Medication Sig   ALPRAZolam (XANAX) 1 MG tablet TAKE 1 TABLET THREE TIMES DAILY AS NEEDED FOR  ANXIETY   buPROPion (WELLBUTRIN XL) 150 MG 24 hr tablet Take 3 tabs daily.  Please schedule a physical for more refills. thanks   cyanocobalamin (,VITAMIN B-12,) 1000 MCG/ML injection Inject 1 mL (1,000 mcg total) into the muscle every 30 (thirty) days.   cyclobenzaprine (FLEXERIL) 5 MG tablet Take 5 mg by mouth 2 (two) times daily as needed for muscle spasms.   diphenhydrAMINE (BENADRYL) 50 MG capsule Take 50 mg by mouth every 6 (six) hours as needed for allergies.   EPINEPHrine 0.3 mg/0.3 mL IJ SOAJ injection Inject 0.3 mg into the muscle once.   Estradiol 10 MCG TABS vaginal tablet Place 1 tablet (10 mcg total) vaginally 2 (two) times a week. (Patient taking differently: Place 1 tablet vaginally daily as needed (vaginal dryness).)   famotidine (PEPCID) 10 MG tablet Take 20 mg by mouth at bedtime.   fexofenadine (ALLEGRA) 180 MG tablet Take 180 mg by mouth daily.   fluticasone (FLONASE) 50 MCG/ACT nasal spray Place 1 spray into both nostrils daily.   hydrochlorothiazide (HYDRODIURIL) 25 MG tablet Take 1 tablet (25 mg total) by mouth daily.   ibuprofen (ADVIL) 200 MG tablet Take 800 mg by mouth every 8 (eight) hours as needed (for pain.).   ipratropium (ATROVENT) 0.03 % nasal spray Place 2 sprays into both nostrils every 12 (twelve) hours.   levETIRAcetam (KEPPRA) 500 MG tablet Take 1,000 mg by mouth at bedtime.    linaclotide (LINZESS) 290 MCG CAPS capsule Take 580 mcg by mouth in the morning.   montelukast (SINGULAIR) 10 MG tablet TAKE 1 TABLET AT BEDTIME (  APPOINTMENT IS NEEDED FOR REFILLS)   Multiple Vitamin (MULTIVITAMIN WITH MINERALS) TABS tablet Take 1 tablet by mouth daily. One A Day for Women   NEXIUM 40 MG capsule TAKE 1 CAPSULE TWICE DAILY   nystatin (MYCOSTATIN/NYSTOP) powder Apply topically 4 (four) times daily. (Patient taking differently: Apply 1 application topically 4 (four) times daily as needed (yeast).)   nystatin ointment (MYCOSTATIN) APPLY 1 APPLICATION TOPICALLY 2 (TWO)  TIMES DAILY AS NEEDED   pilocarpine (SALAGEN) 5 MG tablet Take 5 mg by mouth 2 (two) times daily as needed (dryness).   potassium chloride (KLOR-CON) 10 MEQ tablet Take 2 tablets by mouth once a day.   topiramate (TOPAMAX) 100 MG tablet Take 200 mg by mouth at bedtime.    Vilazodone HCl (VIIBRYD) 40 MG TABS Take 1 tablet (40 mg total) by mouth daily.   vitamin B-12 (CYANOCOBALAMIN) 1000 MCG tablet Take 1,000 mcg by mouth in the morning.   albuterol (VENTOLIN HFA) 108 (90 Base) MCG/ACT inhaler Inhale 1-2 puffs into the lungs every 6 (six) hours as needed for wheezing or shortness of breath. (Patient not taking: Reported on 05/04/2021)   potassium gluconate 595 (99 K) MG TABS tablet Take 595 mg by mouth in the morning. (Patient not taking: Reported on 05/04/2021)   No facility-administered encounter medications on file as of 05/04/2021.    Allergies (verified) Other, Percocet [oxycodone-acetaminophen], Amoxicillin-pot clavulanate, Codeine, Dye fdc red [red dye], Morphine and related, and Vesicare [solifenacin succinate]   History: Past Medical History:  Diagnosis Date   Allergy    takes Allegra daily and uses nasal spray daily.Takes Benadryl as needed.Uses eye drops daily   Anemia    B 12 shots monthly   Anxiety    takes Xanax as needed   Arthritis    osteoporosis   Asthma    Proventil inhaler as needed   Chronic back pain    DDD and stenosis   Complication of anesthesia    pt states she was woke up during surgery   Depression    takes Wellbutrin daily   Fibromyalgia    GERD (gastroesophageal reflux disease)    takes Nexium daily   Headache(784.0)    takes Topamax daily as needed   History of bronchitis    2016   History of colon polyps    benign   History of vertigo    took Meclizine several yrs ago   Hypertension    followed by [facility]   IBS (irritable bowel syndrome)    takes LInzess daily   Insomnia    Joint pain    Muscle spasm    takes  Flexeril daily as needed   Neuromuscular disorder (HCC)    numbness arms legs,feet    Nocturia    Osteoporosis    Pain    takes Keppra and Topamax daily   Peripheral edema    takes HCTZ daily   Sleep apnea    has a CPAP but doesn't wear often   Urinary frequency    Urinary incontinence    Urinary urgency    Past Surgical History:  Procedure Laterality Date   ABDOMINAL HYSTERECTOMY     ANTERIOR FUSION CERVICAL SPINE     BACK SURGERY  2012   bladder tacked     CARPAL TUNNEL RELEASE     R&L   CESAREAN SECTION  1983   CHOLECYSTECTOMY     COLONOSCOPY     COLONOSCOPY WITH PROPOFOL N/A 10/06/2017   Procedure:  COLONOSCOPY WITH PROPOFOL;  Surgeon: Carol Ada, MD;  Location: [facility];  Service: Endoscopy;  Laterality: N/A;   COLONOSCOPY WITH PROPOFOL N/A 12/18/2020   Procedure: COLONOSCOPY WITH PROPOFOL;  Surgeon: Carol Ada, MD;  Location: [facility];  Service: Endoscopy;  Laterality: N/A;   ESOPHAGEAL DILATION     KNEE ARTHROSCOPY     L KNEE   LUMBAR LAMINECTOMY     LUMBAR LAMINECTOMY/DECOMPRESSION MICRODISCECTOMY  02/25/2011   Procedure: LUMBAR LAMINECTOMY/DECOMPRESSION MICRODISCECTOMY;  Surgeon: Floyce Stakes;  Location: [facility];  Service: Neurosurgery;  Laterality: N/A;  Lumbar Four-Five Microdiscectomy   LUMBAR WOUND DEBRIDEMENT  03/18/2011   Procedure: LUMBAR WOUND DEBRIDEMENT;  Surgeon: Floyce Stakes;  Location: [facility];  Service: Neurosurgery;  Laterality: N/A;  Exploration of Lumbar wound   POLYPECTOMY  10/06/2017   Procedure: POLYPECTOMY;  Surgeon: Carol Ada, MD;  Location: [facility];  Service: Endoscopy;;   POLYPECTOMY  12/18/2020   Procedure: POLYPECTOMY;  Surgeon: Carol Ada, MD;  Location: [facility];  Service: Endoscopy;;   SEPTOPLASTY  10+yrs ago   [place] N/A 12/11/2015   Procedure: LUMBAR SPINAL CORD STIMULATOR INSERTION;  Surgeon: Clydell Hakim, MD;  Location: [facility];  Service: Neurosurgery;   Laterality: N/A;   Family History  Problem Relation Age of Onset   Arthritis Mother    Hypertension Mother    Kidney disease Mother    COPD Mother    Heart disease Mother 52       CAD   Diabetes Mother    Mental illness Mother    Arthritis Father    Diabetes Father    Heart disease Father    Cancer Father        prostate cancer   Mental illness Father        Alzheimers Dementia   Cancer Maternal Grandmother        brain tumor   Cancer Maternal Grandfather        lung   Diabetes Maternal Grandfather    Early death Brother    Anesthesia problems Neg Hx    Hypotension Neg Hx    Malignant hyperthermia Neg Hx    Pseudochol deficiency Neg Hx    Social History   Socioeconomic History   Marital status: Married    Spouse name: Not on file   Number of children: Not on file   Years of education: Not on file   Highest education level: Not on file  Occupational History   Not on file  Tobacco Use   Smoking status: Never    Passive exposure: Never   Smokeless tobacco: Never  Vaping Use   Vaping Use: Never used  Substance and Sexual Activity   Alcohol use: No   Drug use: No   Sexual activity: Not on file  Other Topics Concern   Not on file  Social History Narrative   Not on file   Social Determinants of Health   Financial Resource Strain: Low Risk    Difficulty of Paying Living Expenses: Not hard at all  Food Insecurity: No Food Insecurity   Worried About Charity fundraiser in the Last Year: Never true   Ran Out of Food in the Last Year: Never true  Transportation Needs: No Transportation Needs   Lack of Transportation (Medical): No   Lack of Transportation (Non-Medical): No  Physical Activity: Inactive   Days of Exercise per Week: 0 days   Minutes of Exercise per Session: 0 min  Stress: Stress Concern Present   Feeling of Stress : To some extent  Social Connections: Moderately Isolated   Frequency of Communication with Friends and Family: Twice a week    Frequency of Social Gatherings with Friends and Family: Once a week   Attends Religious Services: Never   Marine scientist or Organizations: No   Attends Music therapist: Never   Marital Status: Married    Tobacco Counseling Counseling given: Not Answered   Clinical Intake:  Pre-visit preparation completed: Yes  Pain : No/denies pain     Nutritional Status: BMI > 30  Obese Nutritional Risks: Nausea/ vomitting/ diarrhea (vomiting due to reflux) Diabetes: No  How often do you need to have someone help you when you read instructions, pamphlets, or other written materials from your doctor or pharmacy?: 1 - Never What is the last grade level you completed in school?: 12th grade  Diabetic? no  Interpreter Needed?: No  Information entered by :: NAllen LPN   Activities of Daily Living In your present state of health, do you have any difficulty performing the following activities: 05/04/2021 05/26/2020  Hearing? Y N  Vision? Y Y  Comment has cataracts and floaters -  Difficulty concentrating or making decisions? N N  Walking or climbing stairs? Y Y  Comment - has to go sideways on stairs, and has back pain  Dressing or bathing? N N  Doing errands, shopping? Y N  Preparing Food and eating ? N N  Using the Toilet? N N  In the past six months, have you accidently leaked urine? Y Y  Comment - has urge incontienence  Do you have problems with loss of bowel control? N N  Managing your Medications? N N  Managing your Finances? N N  Housekeeping or managing your Housekeeping? N N  Some recent data might be hidden    Patient Care Team: Eulas Post, MD as PCP - General (Family Medicine)  Indicate any recent Medical Services you may have received from other than [facility] providers in the past year (date may be approximate).     Assessment:   This is a routine wellness examination for Alynna.  Hearing/Vision screen Vision Screening - Comments:: Regular  eye exams, Dr. Alois Cliche  Dietary issues and exercise activities discussed: Current Exercise Habits: The patient does not participate in regular exercise at present   Goals Addressed             This Visit's Progress    Patient Stated       05/04/2021, no goals       Depression Screen PHQ 2/9 Scores 05/04/2021 05/26/2020 11/13/2015  PHQ - 2 Score 6 2 0  PHQ- 9 Score 11 7 -    Fall Risk Fall Risk  05/04/2021 05/26/2020 11/13/2015  Falls in the past year? 0 1 Yes  Number falls in past yr: - 0 2 or more  Injury with Fall? - 0 Yes  Risk for fall due to : Medication side effect History of fall(s) -  Follow up Falls evaluation completed;Education provided;Falls prevention discussed Falls evaluation completed;Falls prevention discussed Education provided    FALL RISK PREVENTION PERTAINING TO THE HOME:  Any stairs in or around the home? Yes  If so, are there any without handrails? No  Home free of loose throw rugs in walkways, pet beds, electrical cords, etc? Yes  Adequate lighting in your home to reduce risk of falls? Yes   ASSISTIVE DEVICES UTILIZED TO  PREVENT FALLS:  Life alert? No  Use of a cane, walker or w/c? No  Grab bars in the bathroom? Yes  Shower chair or bench in shower? No  Elevated toilet seat or a handicapped toilet? Yes   TIMED UP AND GO:  Was the test performed? No .      Cognitive Function:        Immunizations Immunization History  Administered Date(s) Administered   Influenza Split 03/19/2011   Influenza,inj,Quad PF,6+ Mos 01/02/2013, 11/13/2015, 12/30/2015   Moderna Sars-Covid-2 Vaccination 05/30/2019, 06/29/2019, 03/02/2020   Pneumococcal Polysaccharide-23 08/02/2012   Tdap 08/09/2012    TDAP status: Up to date  Flu Vaccine status: Declined, Education has been provided regarding the importance of this vaccine but patient still declined. Advised may receive this vaccine at local pharmacy or Health Dept. Aware to provide a copy of the  vaccination record if obtained from local pharmacy or Health Dept. Verbalized acceptance and understanding.  Pneumococcal vaccine status: Up to date  Covid-19 vaccine status: Completed vaccines  Qualifies for Shingles Vaccine? Yes   Zostavax completed No   Shingrix Completed?: No.    Education has been provided regarding the importance of this vaccine. Patient has been advised to call insurance company to determine out of pocket expense if they have not yet received this vaccine. Advised may also receive vaccine at local pharmacy or Health Dept. Verbalized acceptance and understanding.  Screening Tests Health Maintenance  Topic Date Due   HIV Screening  Never done   Hepatitis C Screening  Never done   COVID-19 Vaccine (4 - Booster for Moderna series) 05/20/2021 (Originally 04/27/2020)   INFLUENZA VACCINE  06/18/2021 (Originally 10/19/2020)   Zoster Vaccines- Shingrix (1 of 2) 08/01/2021 (Originally 10/16/2009)   PAP SMEAR-Modifier  04/06/2035 (Originally 08/03/2014)   MAMMOGRAM  06/05/2022   TETANUS/TDAP  08/10/2022   COLONOSCOPY (Pts 45-60yrs Insurance coverage will need to be confirmed)  12/18/2025   HPV VACCINES  Aged Out    Health Maintenance  Health Maintenance Due  Topic Date Due   HIV Screening  Never done   Hepatitis C Screening  Never done    Colorectal cancer screening: Type of screening: Colonoscopy. Completed 12/18/2020. Repeat every 3 years  Mammogram status: Completed 06/04/2020. Repeat every year  Bone Density status: n/a  Lung Cancer Screening: (Low Dose CT Chest recommended if Age 52-80 years, 30 pack-year currently smoking OR have quit w/in 15years.) does not qualify.   Lung Cancer Screening Referral: no  Additional Screening:  Hepatitis C Screening: does qualify;   Vision Screening: Recommended annual ophthalmology exams for early detection of glaucoma and other disorders of the eye. Is the patient up to date with their annual eye exam?  Yes  Who is the  provider or what is the name of the office in which the patient attends annual eye exams? Dr. Idolina Primer If pt is not established with a provider, would they like to be referred to a provider to establish care? No .   Dental Screening: Recommended annual dental exams for proper oral hygiene  Community Resource Referral / Chronic Care Management: CRR required this visit?  No   CCM required this visit?  No      Plan:     I have personally reviewed and noted the following in the patients chart:   Medical and social history Use of alcohol, tobacco or illicit drugs  Current medications and supplements including opioid prescriptions.  Functional ability and status Nutritional status Physical activity Advanced  directives List of other physicians Hospitalizations, surgeries, and ER visits in previous 12 months Vitals Screenings to include cognitive, depression, and falls Referrals and appointments  In addition, I have reviewed and discussed with patient certain preventive protocols, quality metrics, and best practice recommendations. A written personalized care plan for preventive services as well as general preventive health recommendations were provided to patient.     Kellie Simmering, LPN   [id number]/54/[id number]   Nurse Notes: 6 CIT not administered. Patient appeared to have normal cogniion during conversation.  Due to this being a virtual visit, the after visit summary with patients personalized plan was offered to patient via mail or my-chart.  patient was mailed a copy of AVS.

## 2021-05-04 NOTE — Patient Instructions (Signed)
[name] , Thank you for taking time to come for your Medicare Wellness Visit. I appreciate your ongoing commitment to your health goals. Please review the following plan we discussed and let me know if I can assist you in the future.   Screening recommendations/referrals: Colonoscopy: completed 12/18/2020, due 12/19/2023 Mammogram: completed 06/04/2020, due 06/05/2021 Bone Density: n/a Recommended yearly ophthalmology/optometry visit for glaucoma screening and checkup Recommended yearly dental visit for hygiene and checkup  Vaccinations: Influenza vaccine: decline Pneumococcal vaccine: n/a Tdap vaccine: completed 08/09/2012, due 08/10/2022 Shingles vaccine: decline  Covid-19:  03/02/2020, 06/29/2019, 05/30/2019  Advanced directives: Advance directive discussed with you today.   Conditions/risks identified: none  Next appointment: Follow up in one year for your annual wellness visit.   Preventive Care 40-64 Years, Female Preventive care refers to lifestyle choices and visits with your health care provider that can promote health and wellness. What does preventive care include? A yearly physical exam. This is also called an annual well check. Dental exams once or twice a year. Routine eye exams. Ask your health care provider how often you should have your eyes checked. Personal lifestyle choices, including: Daily care of your teeth and gums. Regular physical activity. Eating a healthy diet. Avoiding tobacco and drug use. Limiting alcohol use. Practicing safe sex. Taking low-dose aspirin daily starting at age 44. Taking vitamin and mineral supplements as recommended by your health care provider. What happens during an annual well check? The services and screenings done by your health care provider during your annual well check will depend on your age, overall health, lifestyle risk factors, and family history of disease. Counseling  Your health care provider may ask you questions  about your: Alcohol use. Tobacco use. Drug use. Emotional well-being. Home and relationship well-being. Sexual activity. Eating habits. Work and work Statistician. Method of birth control. Menstrual cycle. Pregnancy history. Screening  You may have the following tests or measurements: Height, weight, and BMI. Blood pressure. Lipid and cholesterol levels. These may be checked every 5 years, or more frequently if you are over 37 years old. Skin check. Lung cancer screening. You may have this screening every year starting at age 105 if you have a 30-pack-year history of smoking and currently smoke or have quit within the past 15 years. Fecal occult blood test (FOBT) of the stool. You may have this test every year starting at age 65. Flexible sigmoidoscopy or colonoscopy. You may have a sigmoidoscopy every 5 years or a colonoscopy every 10 years starting at age 34. Hepatitis C blood test. Hepatitis B blood test. Sexually transmitted disease (STD) testing. Diabetes screening. This is done by checking your blood sugar (glucose) after you have not eaten for a while (fasting). You may have this done every 1-3 years. Mammogram. This may be done every 1-2 years. Talk to your health care provider about when you should start having regular mammograms. This may depend on whether you have a family history of breast cancer. BRCA-related cancer screening. This may be done if you have a family history of breast, ovarian, tubal, or peritoneal cancers. Pelvic exam and Pap test. This may be done every 3 years starting at age 33. Starting at age 67, this may be done every 5 years if you have a Pap test in combination with an HPV test. Bone density scan. This is done to screen for osteoporosis. You may have this scan if you are at high risk for osteoporosis. Discuss your test results, treatment options, and if necessary,  the need for more tests with your health care provider. Vaccines  Your health care  provider may recommend certain vaccines, such as: Influenza vaccine. This is recommended every year. Tetanus, diphtheria, and acellular pertussis (Tdap, Td) vaccine. You may need a Td booster every 10 years. Zoster vaccine. You may need this after age 58. Pneumococcal 13-valent conjugate (PCV13) vaccine. You may need this if you have certain conditions and were not previously vaccinated. Pneumococcal polysaccharide (PPSV23) vaccine. You may need one or two doses if you smoke cigarettes or if you have certain conditions. Talk to your health care provider about which screenings and vaccines you need and how often you need them. This information is not intended to replace advice given to you by your health care provider. Make sure you discuss any questions you have with your health care provider. Document Released: 04/03/2015 Document Revised: 11/25/2015 Document Reviewed: 01/06/2015 Elsevier Interactive Patient Education  2017 [employer] Prevention in the Home Falls can cause injuries. They can happen to people of all ages. There are many things you can do to make your home safe and to help prevent falls. What can I do on the outside of my home? Regularly fix the edges of walkways and driveways and fix any cracks. Remove anything that might make you trip as you walk through a door, such as a raised step or threshold. Trim any bushes or trees on the path to your home. Use bright outdoor lighting. Clear any walking paths of anything that might make someone trip, such as rocks or tools. Regularly check to see if handrails are loose or broken. Make sure that both sides of any steps have handrails. Any raised decks and porches should have guardrails on the edges. Have any leaves, snow, or ice cleared regularly. Use sand or salt on walking paths during winter. Clean up any spills in your garage right away. This includes oil or grease spills. What can I do in the bathroom? Use night  lights. Install grab bars by the toilet and in the tub and shower. Do not use towel bars as grab bars. Use non-skid mats or decals in the tub or shower. If you need to sit down in the shower, use a plastic, non-slip stool. Keep the floor dry. Clean up any water that spills on the floor as soon as it happens. Remove soap buildup in the tub or shower regularly. Attach bath mats securely with double-sided non-slip rug tape. Do not have throw rugs and other things on the floor that can make you trip. What can I do in the bedroom? Use night lights. Make sure that you have a light by your bed that is easy to reach. Do not use any sheets or blankets that are too big for your bed. They should not hang down onto the floor. Have a firm chair that has side arms. You can use this for support while you get dressed. Do not have throw rugs and other things on the floor that can make you trip. What can I do in the kitchen? Clean up any spills right away. Avoid walking on wet floors. Keep items that you use a lot in easy-to-reach places. If you need to reach something above you, use a strong step stool that has a grab bar. Keep electrical cords out of the way. Do not use floor polish or wax that makes floors slippery. If you must use wax, use non-skid floor wax. Do not have throw rugs  and other things on the floor that can make you trip. What can I do with my stairs? Do not leave any items on the stairs. Make sure that there are handrails on both sides of the stairs and use them. Fix handrails that are broken or loose. Make sure that handrails are as long as the stairways. Check any carpeting to make sure that it is firmly attached to the stairs. Fix any carpet that is loose or worn. Avoid having throw rugs at the top or bottom of the stairs. If you do have throw rugs, attach them to the floor with carpet tape. Make sure that you have a light switch at the top of the stairs and the bottom of the stairs. If  you do not have them, ask someone to add them for you. What else can I do to help prevent falls? Wear shoes that: Do not have high heels. Have rubber bottoms. Are comfortable and fit you well. Are closed at the toe. Do not wear sandals. If you use a stepladder: Make sure that it is fully opened. Do not climb a closed stepladder. Make sure that both sides of the stepladder are locked into place. Ask someone to hold it for you, if possible. Clearly mark and make sure that you can see: Any grab bars or handrails. First and last steps. Where the edge of each step is. Use tools that help you move around (mobility aids) if they are needed. These include: Canes. Walkers. Scooters. Crutches. Turn on the lights when you go into a dark area. Replace any light bulbs as soon as they burn out. Set up your furniture so you have a clear path. Avoid moving your furniture around. If any of your floors are uneven, fix them. If there are any pets around you, be aware of where they are. Review your medicines with your doctor. Some medicines can make you feel dizzy. This can increase your chance of falling. Ask your doctor what other things that you can do to help prevent falls. This information is not intended to replace advice given to you by your health care provider. Make sure you discuss any questions you have with your health care provider. Document Released: 01/01/2009 Document Revised: 08/13/2015 Document Reviewed: 04/11/2014 Elsevier Interactive Patient Education  2017 [employer].

## 2021-05-21 DIAGNOSIS — Z4542 Encounter for adjustment and management of neuropacemaker (brain) (peripheral nerve) (spinal cord): Secondary | ICD-10-CM | POA: Diagnosis not present

## 2021-05-21 DIAGNOSIS — Z9689 Presence of other specified functional implants: Secondary | ICD-10-CM | POA: Diagnosis not present

## 2021-05-21 DIAGNOSIS — G894 Chronic pain syndrome: Secondary | ICD-10-CM | POA: Diagnosis not present

## 2021-05-21 DIAGNOSIS — M961 Postlaminectomy syndrome, not elsewhere classified: Secondary | ICD-10-CM | POA: Diagnosis not present

## 2021-06-09 ENCOUNTER — Other Ambulatory Visit: Payer: Self-pay | Admitting: Family Medicine

## 2021-06-09 DIAGNOSIS — I10 Essential (primary) hypertension: Secondary | ICD-10-CM

## 2021-06-09 MED ORDER — ALPRAZOLAM 1 MG PO TABS
ORAL_TABLET | ORAL | 0 refills | Status: DC
Start: 2021-06-09 — End: 2021-10-08

## 2021-06-12 ENCOUNTER — Other Ambulatory Visit: Payer: Self-pay | Admitting: Family Medicine

## 2021-06-12 DIAGNOSIS — I10 Essential (primary) hypertension: Secondary | ICD-10-CM

## 2021-06-17 ENCOUNTER — Encounter (HOSPITAL_COMMUNITY): Payer: Self-pay | Admitting: Gastroenterology

## 2021-06-17 NOTE — Progress Notes (Signed)
Attempted to obtain medical history via telephone, unable to reach at this time. I left a voicemail to return pre surgical testing department's phone call.  

## 2021-06-23 ENCOUNTER — Ambulatory Visit: Payer: Medicare PPO | Admitting: Family Medicine

## 2021-06-23 ENCOUNTER — Encounter: Payer: Self-pay | Admitting: Family Medicine

## 2021-06-23 VITALS — BP 110/70 | HR 104 | Temp 97.8°F | Ht 66.0 in | Wt 279.4 lb

## 2021-06-23 DIAGNOSIS — R519 Headache, unspecified: Secondary | ICD-10-CM | POA: Diagnosis not present

## 2021-06-23 DIAGNOSIS — R7989 Other specified abnormal findings of blood chemistry: Secondary | ICD-10-CM

## 2021-06-23 DIAGNOSIS — I10 Essential (primary) hypertension: Secondary | ICD-10-CM

## 2021-06-23 DIAGNOSIS — Z4542 Encounter for adjustment and management of neuropacemaker (brain) (peripheral nerve) (spinal cord): Secondary | ICD-10-CM | POA: Diagnosis not present

## 2021-06-23 DIAGNOSIS — R7303 Prediabetes: Secondary | ICD-10-CM | POA: Diagnosis not present

## 2021-06-23 LAB — BASIC METABOLIC PANEL
BUN: 13 mg/dL (ref 6–23)
CO2: 26 mEq/L (ref 19–32)
Calcium: 9.8 mg/dL (ref 8.4–10.5)
Chloride: 103 mEq/L (ref 96–112)
Creatinine, Ser: 1.14 mg/dL (ref 0.40–1.20)
GFR: 51.89 mL/min — ABNORMAL LOW (ref >60.00)
Glucose, Bld: 106 mg/dL — ABNORMAL HIGH (ref 70–99)
Potassium: 3.6 mEq/L (ref 3.5–5.1)
Sodium: 140 mEq/L (ref 135–145)

## 2021-06-23 LAB — HEMOGLOBIN A1C: Hgb A1c MFr Bld: 6.7 % — ABNORMAL HIGH (ref 4.6–6.5)

## 2021-06-23 LAB — VITAMIN D 25 HYDROXY (VIT D DEFICIENCY, FRACTURES): VITD: 39.1 ng/mL (ref 30.00–100.00)

## 2021-06-23 NOTE — Progress Notes (Signed)
? ?Established Patient Office Visit ? ?Subjective:  ?Patient ID: Taylor Ritter, female    DOB: 1960-04-12  Age: 61 y.o. MRN: [id number] ? ?CC:  ?Chief Complaint  ?Patient presents with  ? Labs Only  ? ? ?HPI ?EVY LUTTERMAN presents for medical follow-up.  She has history of hypertension, obesity, IBS, GERD, esophageal stricture, osteoarthritis involving multiple joints, history of B12 deficiency, prediabetes.  She is requesting labs.  Would like A1c repeated.  Also history of low vitamin D and not checked in few years.  She remains on HCTZ and also takes potassium replacement.  Has had hypokalemia in the past.  She is followed by multiple specialist.  She has had some chronic pain issues and followed by orthopedics for that. ? ?She does have a history of recurrent depression and has been followed by psychiatry.  She is maintained on Viibryd and high-dose Wellbutrin.  Still has some breakthrough depression at times. ? ?She has had some recent dysphagia and takes high-dose PPI and supplements with H2 blocker.  Scheduled for repeat endoscopy soon ? ?Past Medical History:  ?Diagnosis Date  ? Allergy   ? takes Allegra daily and uses nasal spray daily.Takes Benadryl as needed.Uses eye drops daily  ? Anemia   ? B 12 shots monthly  ? Anxiety   ? takes Xanax as needed  ? Arthritis   ? osteoporosis  ? Asthma   ? Proventil inhaler as needed  ? Chronic back pain   ? DDD and stenosis  ? Complication of anesthesia   ? pt states she was woke up during surgery  ? Depression   ? takes Wellbutrin daily  ? Fibromyalgia   ? GERD (gastroesophageal reflux disease)   ? takes Nexium daily  ? Headache(784.0)   ? takes Topamax daily as needed  ? History of bronchitis   ? 2016  ? History of colon polyps   ? benign  ? History of vertigo   ? took Meclizine several yrs ago  ? Hypertension   ? followed by [facility]  ? IBS (irritable bowel syndrome)   ? takes LInzess daily  ? Insomnia   ? Joint pain   ? Muscle spasm   ?  takes Flexeril daily as needed  ? Neuromuscular disorder (HCC)   ? numbness arms legs,feet   ? Nocturia   ? Osteoporosis   ? Pain   ? takes Keppra and Topamax daily  ? Peripheral edema   ? takes HCTZ daily  ? Sleep apnea   ? has a CPAP but doesn't wear often  ? Urinary frequency   ? Urinary incontinence   ? Urinary urgency   ? ? ?Past Surgical History:  ?Procedure Laterality Date  ? ABDOMINAL HYSTERECTOMY    ? ANTERIOR FUSION CERVICAL SPINE    ? BACK SURGERY  2012  ? bladder tacked    ? CARPAL TUNNEL RELEASE    ? R&L  ? [place]  ? CHOLECYSTECTOMY    ? COLONOSCOPY    ? COLONOSCOPY WITH PROPOFOL N/A 10/06/2017  ? Procedure: COLONOSCOPY WITH PROPOFOL;  Surgeon: Carol Ada, MD;  Location: [facility];  Service: Endoscopy;  Laterality: N/A;  ? COLONOSCOPY WITH PROPOFOL N/A 12/18/2020  ? Procedure: COLONOSCOPY WITH PROPOFOL;  Surgeon: Carol Ada, MD;  Location: [facility];  Service: Endoscopy;  Laterality: N/A;  ? ESOPHAGEAL DILATION    ? KNEE ARTHROSCOPY    ? L KNEE  ? LUMBAR LAMINECTOMY    ?  LUMBAR LAMINECTOMY/DECOMPRESSION MICRODISCECTOMY  02/25/2011  ? Procedure: LUMBAR LAMINECTOMY/DECOMPRESSION MICRODISCECTOMY;  Surgeon: Floyce Stakes;  Location: [place] [facility];  Service: Neurosurgery;  Laterality: N/A;  Lumbar Four-Five Microdiscectomy  ? LUMBAR WOUND DEBRIDEMENT  03/18/2011  ? Procedure: LUMBAR WOUND DEBRIDEMENT;  Surgeon: Floyce Stakes;  Location: [place] [facility];  Service: Neurosurgery;  Laterality: N/A;  Exploration of Lumbar wound  ? POLYPECTOMY  10/06/2017  ? Procedure: POLYPECTOMY;  Surgeon: Carol Ada, MD;  Location: [facility];  Service: Endoscopy;;  ? POLYPECTOMY  12/18/2020  ? Procedure: POLYPECTOMY;  Surgeon: Carol Ada, MD;  Location: [facility];  Service: Endoscopy;;  ? SEPTOPLASTY  10+yrs ago  ? SPINAL CORD STIMULATOR INSERTION N/A 12/11/2015  ? Procedure: LUMBAR SPINAL CORD STIMULATOR INSERTION;  Surgeon: Clydell Hakim, MD;  Location: [place] [facility];  Service: Neurosurgery;   Laterality: N/A;  ? ? ?Family History  ?Problem Relation Age of Onset  ? Arthritis Mother   ? Hypertension Mother   ? Kidney disease Mother   ? COPD Mother   ? Heart disease Mother 75  ?     CAD  ? Diabetes Mother   ? Mental illness Mother   ? Arthritis Father   ? Diabetes Father   ? Heart disease Father   ? Cancer Father   ?     prostate cancer  ? Mental illness Father   ?     Alzheimers Dementia  ? Cancer Maternal Grandmother   ?     brain tumor  ? Cancer Maternal Grandfather   ?     lung  ? Diabetes Maternal Grandfather   ? Early death Brother   ? Anesthesia problems Neg Hx   ? Hypotension Neg Hx   ? Malignant hyperthermia Neg Hx   ? Pseudochol deficiency Neg Hx   ? ? ?Social History  ? ?Socioeconomic History  ? Marital status: Married  ?  Spouse name: Not on file  ? Number of children: Not on file  ? Years of education: Not on file  ? Highest education level: Not on file  ?Occupational History  ? Not on file  ?Tobacco Use  ? Smoking status: Never  ?  Passive exposure: Never  ? Smokeless tobacco: Never  ?Vaping Use  ? Vaping Use: Never used  ?Substance and Sexual Activity  ? Alcohol use: No  ? Drug use: No  ? Sexual activity: Not on file  ?Other Topics Concern  ? Not on file  ?Social History Narrative  ? Not on file  ? ?Social Determinants of Health  ? ?Financial Resource Strain: Low Risk   ? Difficulty of Paying Living Expenses: Not hard at all  ?Food Insecurity: No Food Insecurity  ? Worried About Charity fundraiser in the Last Year: Never true  ? Ran Out of Food in the Last Year: Never true  ?Transportation Needs: No Transportation Needs  ? Lack of Transportation (Medical): No  ? Lack of Transportation (Non-Medical): No  ?Physical Activity: Inactive  ? Days of Exercise per Week: 0 days  ? Minutes of Exercise per Session: 0 min  ?Stress: Stress Concern Present  ? Feeling of Stress : To some extent  ?Social Connections: Not on file  ?Intimate Partner Violence: Not on file  ? ? ?Outpatient Medications Prior to  Visit  ?Medication Sig Dispense Refill  ? albuterol (VENTOLIN HFA) 108 (90 Base) MCG/ACT inhaler Inhale 1-2 puffs into the lungs every 6 (six) hours as needed for wheezing or shortness of  breath.    ? ALPRAZolam (XANAX) 1 MG tablet TAKE 1 TABLET THREE TIMES DAILY AS NEEDED FOR ANXIETY 30 tablet 0  ? buPROPion (WELLBUTRIN XL) 150 MG 24 hr tablet Take 3 tabs daily.  Please schedule a physical for more refills. thanks (Patient taking differently: Take 450 mg by mouth daily. Take 3 tabs daily.  Please schedule a physical for more refills. thanks) 270 tablet 3  ? cyanocobalamin (,VITAMIN B-12,) 1000 MCG/ML injection Inject 1 mL (1,000 mcg total) into the muscle every 30 (thirty) days. 10 mL 1  ? cyclobenzaprine (FLEXERIL) 5 MG tablet Take 5 mg by mouth 2 (two) times daily as needed for muscle spasms.    ? diphenhydrAMINE (BENADRYL) 50 MG capsule Take 50 mg by mouth every 6 (six) hours as needed for allergies.    ? EPINEPHrine 0.3 mg/0.3 mL IJ SOAJ injection Inject 0.3 mg into the muscle as needed for anaphylaxis.    ? Estradiol 10 MCG TABS vaginal tablet Place 1 tablet (10 mcg total) vaginally 2 (two) times a week. (Patient taking differently: Place 1 tablet vaginally daily as needed (vaginal dryness).) 24 tablet 3  ? famotidine (PEPCID) 10 MG tablet Take 20 mg by mouth at bedtime.    ? fexofenadine (ALLEGRA) 180 MG tablet Take 180 mg by mouth daily.    ? fluticasone (FLONASE) 50 MCG/ACT nasal spray Place 1 spray into both nostrils daily. (Patient taking differently: Place 1 spray into both nostrils daily as needed for allergies.) 16 g 2  ? hydrochlorothiazide (HYDRODIURIL) 25 MG tablet TAKE 1 TABLET (25 MG TOTAL) BY MOUTH DAILY. 90 tablet 0  ? ibuprofen (ADVIL) 200 MG tablet Take 800 mg by mouth every 8 (eight) hours as needed (for pain.).    ? ipratropium (ATROVENT) 0.03 % nasal spray Place 2 sprays into both nostrils every 12 (twelve) hours. 30 mL 12  ? levETIRAcetam (KEPPRA) 500 MG tablet Take 1,000 mg by mouth at  bedtime.     ? linaclotide (LINZESS) 290 MCG CAPS capsule Take 580 mcg by mouth in the morning.    ? montelukast (SINGULAIR) 10 MG tablet Take 1 tablet (10 mg total) by mouth at bedtime. 90 tablet 0  ?

## 2021-06-24 ENCOUNTER — Other Ambulatory Visit: Payer: Self-pay

## 2021-06-24 DIAGNOSIS — R7303 Prediabetes: Secondary | ICD-10-CM

## 2021-06-24 MED ORDER — METFORMIN HCL 500 MG PO TABS: ORAL_TABLET | ORAL | 0 refills | Status: DC

## 2021-06-25 ENCOUNTER — Other Ambulatory Visit: Payer: Self-pay

## 2021-06-25 ENCOUNTER — Encounter (HOSPITAL_COMMUNITY)
Admission: RE | Disposition: A | Discharge status: Home / Self Care | Payer: Self-pay | Source: Home / Self Care | Attending: Gastroenterology

## 2021-06-25 ENCOUNTER — Ambulatory Visit (HOSPITAL_COMMUNITY): Payer: Medicare PPO | Admitting: Anesthesiology

## 2021-06-25 ENCOUNTER — Ambulatory Visit (HOSPITAL_BASED_OUTPATIENT_CLINIC_OR_DEPARTMENT_OTHER): Payer: Medicare PPO | Admitting: Anesthesiology

## 2021-06-25 ENCOUNTER — Encounter (HOSPITAL_COMMUNITY): Payer: Self-pay | Admitting: Gastroenterology

## 2021-06-25 ENCOUNTER — Ambulatory Visit (HOSPITAL_COMMUNITY)
Admission: RE | Admit: 2021-06-25 | Discharge: 2021-06-25 | Disposition: A | Discharge status: Home / Self Care | Payer: Medicare PPO | Attending: Gastroenterology | Admitting: Gastroenterology

## 2021-06-25 DIAGNOSIS — I10 Essential (primary) hypertension: Secondary | ICD-10-CM | POA: Insufficient documentation

## 2021-06-25 DIAGNOSIS — M1990 Unspecified osteoarthritis, unspecified site: Secondary | ICD-10-CM | POA: Insufficient documentation

## 2021-06-25 DIAGNOSIS — Z79899 Other long term (current) drug therapy: Secondary | ICD-10-CM | POA: Insufficient documentation

## 2021-06-25 DIAGNOSIS — Z6841 Body Mass Index (BMI) 40.0 and over, adult: Secondary | ICD-10-CM | POA: Insufficient documentation

## 2021-06-25 DIAGNOSIS — F419 Anxiety disorder, unspecified: Secondary | ICD-10-CM | POA: Insufficient documentation

## 2021-06-25 DIAGNOSIS — F32A Depression, unspecified: Secondary | ICD-10-CM | POA: Diagnosis not present

## 2021-06-25 DIAGNOSIS — R1310 Dysphagia, unspecified: Secondary | ICD-10-CM

## 2021-06-25 DIAGNOSIS — G4730 Sleep apnea, unspecified: Secondary | ICD-10-CM

## 2021-06-25 DIAGNOSIS — K219 Gastro-esophageal reflux disease without esophagitis: Secondary | ICD-10-CM | POA: Insufficient documentation

## 2021-06-25 DIAGNOSIS — J45909 Unspecified asthma, uncomplicated: Secondary | ICD-10-CM | POA: Insufficient documentation

## 2021-06-25 DIAGNOSIS — M797 Fibromyalgia: Secondary | ICD-10-CM | POA: Insufficient documentation

## 2021-06-25 DIAGNOSIS — F418 Other specified anxiety disorders: Secondary | ICD-10-CM | POA: Diagnosis not present

## 2021-06-25 HISTORY — PX: ESOPHAGOGASTRODUODENOSCOPY (EGD) WITH PROPOFOL: SHX5813

## 2021-06-25 HISTORY — PX: SAVORY DILATION: SHX5439

## 2021-06-25 SURGERY — ESOPHAGOGASTRODUODENOSCOPY (EGD) WITH PROPOFOL
Anesthesia: Monitor Anesthesia Care

## 2021-06-25 MED ORDER — PROPOFOL 1000 MG/100ML IV EMUL
INTRAVENOUS | Status: AC
  Filled 2021-06-25: qty 100

## 2021-06-25 MED ORDER — PHENYLEPHRINE HCL (PRESSORS) 10 MG/ML IV SOLN
INTRAVENOUS | Status: AC
  Filled 2021-06-25: qty 1

## 2021-06-25 MED ORDER — SODIUM CHLORIDE 0.9 % IV SOLN: INTRAVENOUS | Status: DC

## 2021-06-25 MED ORDER — PROPOFOL 1000 MG/100ML IV EMUL
INTRAVENOUS | Status: AC
Start: 2021-06-25 — End: ?
  Filled 2021-06-25: qty 100

## 2021-06-25 MED ORDER — PROPOFOL 500 MG/50ML IV EMUL
INTRAVENOUS | Status: DC | PRN
  Administered 2021-06-25: 150 ug/kg/min via INTRAVENOUS

## 2021-06-25 MED ORDER — PROPOFOL 500 MG/50ML IV EMUL
INTRAVENOUS | Status: DC | PRN
  Administered 2021-06-25 (×2): 20 mg via INTRAVENOUS

## 2021-06-25 MED ORDER — PROPOFOL 500 MG/50ML IV EMUL
INTRAVENOUS | Status: AC
  Filled 2021-06-25: qty 150

## 2021-06-25 SURGICAL SUPPLY — 15 items

## 2021-06-25 NOTE — Transfer of Care (Signed)
Immediate Anesthesia Transfer of Care Note ? ?Patient: Taylor Ritter ? ?Procedure(s) Performed: ESOPHAGOGASTRODUODENOSCOPY (EGD) WITH PROPOFOL ?SAVORY DILATION ? ?Patient Location: PACU ? ?Anesthesia Type:MAC ? ?Level of Consciousness: awake, alert  and oriented ? ?Airway & Oxygen Therapy: Patient Spontanous Breathing and Patient connected to face mask oxygen ? ?Post-op Assessment: Report given to RN, Post -op Vital signs reviewed and stable and Patient moving all extremities X 4 ? ?Post vital signs: Reviewed and stable ? ?Last Vitals:  ?Vitals Value Taken Time  ?BP 89/50   ?Temp    ?Pulse 88   ?Resp    ?SpO2 94   ? ? ?Last Pain:  ?Vitals:  ? 06/25/21 0821  ?TempSrc: Temporal  ?PainSc: 5   ?   ? ?  ? ?Complications: No notable events documented. ?

## 2021-06-25 NOTE — Op Note (Signed)
[facility] ?Patient Name: Taylor Ritter ?Procedure Date: 06/25/2021 ?MRN: [id number] ?Attending MD: Carol Ada , MD ?Date of Birth: 06-03-1960 ?CSN: [id number] ?Age: 61 ?Admit Type: Outpatient ?Procedure:                Upper GI endoscopy ?Indications:              Dysphagia ?Providers:                Carol Ada, MD, [name] Isaac, RN, Julious Oka  ?                          [place], RN ?Referring MD:              ?Medicines:                Propofol per Anesthesia ?Complications:            No immediate complications. ?Estimated Blood Loss:     Estimated blood loss: none. ?Procedure:                Pre-Anesthesia Assessment: ?                          - Prior to the procedure, a History and Physical  ?                          was performed, and patient medications and  ?                          allergies were reviewed. The patient's tolerance of  ?                          previous anesthesia was also reviewed. The risks  ?                          and benefits of the procedure and the sedation  ?                          options and risks were discussed with the patient.  ?                          All questions were answered, and informed consent  ?                          was obtained. Prior Anticoagulants: The patient has  ?                          taken no previous anticoagulant or antiplatelet  ?                          agents. ASA Grade Assessment: III - A patient with  ?                          severe systemic disease. After reviewing the risks  ?                          and benefits,  the patient was deemed in  ?                          satisfactory condition to undergo the procedure. ?                          - Sedation was administered by an anesthesia  ?                          professional. Deep sedation was attained. ?                          After obtaining informed consent, the endoscope was  ?                          passed under direct vision. Throughout the  ?                           procedure, the patient's blood pressure, pulse, and  ?                          oxygen saturations were monitored continuously. The  ?                          [id number]-[id number] ([id number]) Olympus endoscope was introduced  ?                          through the mouth, and advanced to the second part  ?                          of duodenum. The upper GI endoscopy was  ?                          accomplished without difficulty. The patient  ?                          tolerated the procedure well. ?Scope In: ?Scope Out: ?Findings: ?     No endoscopic abnormality was evident in the esophagus to explain the  ?     patient's complaint of dysphagia. It was decided, however, to proceed  ?     with dilation of the entire esophagus. A guidewire was placed and the  ?     scope was withdrawn. Dilation was performed with a Savary dilator with  ?     no resistance at 18 mm. The dilation site was examined following  ?     endoscope reinsertion and showed no change. ?     The stomach was normal. ?     The examined duodenum was normal. ?     Initial inspection of the esophageal lumen was positive for a  ?     significant amount of sinus drainage. ?Impression:               - No endoscopic esophageal abnormality to explain  ?                          patient's dysphagia.  Esophagus dilated. Dilated. ?                          - Normal stomach. ?                          - Normal examined duodenum. ?                          - No specimens collected. ?Moderate Sedation: ?     Not Applicable - Patient had care per Anesthesia. ?Recommendation:           - Patient has a contact number available for  ?                          emergencies. The signs and symptoms of potential  ?                          delayed complications were discussed with the  ?                          patient. Return to normal activities tomorrow.  ?                          Written discharge instructions were provided to the  ?                           patient. ?                          - Resume previous diet. ?                          - Continue present medications. ?                          - Continue to treat sinus drainage. ?Procedure Code(s):        --- Professional --- ?                          [phone], Esophagogastroduodenoscopy, flexible,  ?                          transoral; with insertion of guide wire followed by  ?                          passage of dilator(s) through esophagus over guide  ?                          wire ?Diagnosis Code(s):        --- Professional --- ?                          R13.10, Dysphagia, unspecified ?CPT copyright 2019 [employer]. All rights reserved. ?The codes documented in this report are preliminary and upon coder review may  ?be revised to meet current compliance requirements. ?Carol Ada, MD ?Carol Ada, MD ?06/25/2021 8:52:05 AM ?This  report has been signed electronically. ?Number of Addenda: 0 ?

## 2021-06-25 NOTE — Discharge Instructions (Signed)

## 2021-06-25 NOTE — Anesthesia Preprocedure Evaluation (Addendum)
Anesthesia Evaluation  ?Patient identified by MRN, date of birth, ID band ?Patient awake ? ? ? ?Reviewed: ?Allergy & Precautions, NPO status , Patient's Chart, lab work & pertinent test results ? ?Airway ?Mallampati: II ? ?TM Distance: >3 FB ?Neck ROM: Full ? ? ? Dental ?no notable dental hx. ?(+) Teeth Intact, Dental Advisory Given ?  ?Pulmonary ?asthma , sleep apnea ,  ?  ?Pulmonary exam normal ?breath sounds clear to auscultation ? ? ? ? ? ? Cardiovascular ?hypertension, Pt. on medications ?Normal cardiovascular exam ?Rhythm:Regular Rate:Normal ? ? ?  ?Neuro/Psych ? Headaches, PSYCHIATRIC DISORDERS Anxiety Depression   ? GI/Hepatic ?Neg liver ROS, GERD  Medicated,  ?Endo/Other  ?Morbid obesity ? Renal/GU ?negative Renal ROS  ?negative genitourinary ?  ?Musculoskeletal ? ?(+) Arthritis , Fibromyalgia - ? Abdominal ?(+) + obese,   ?Peds ? Hematology ?negative hematology ROS ?(+)   ?Anesthesia Other Findings ? ? Reproductive/Obstetrics ? ?  ? ? ? ? ? ? ? ? ? ? ? ? ? ?  ?  ? ? ? ? ? ? ? ?Anesthesia Physical ? ?Anesthesia Plan ? ?ASA: 3 ? ?Anesthesia Plan: MAC  ? ?Post-op Pain Management: Minimal or no pain anticipated  ? ?Induction: Intravenous ? ?PONV Risk Score and Plan: 2 and Propofol infusion, Treatment may vary due to age or medical condition and TIVA ? ?Airway Management Planned: Natural Airway and Mask ? ?Additional Equipment: None ? ?Intra-op Plan:  ? ?Post-operative Plan:  ? ?Informed Consent: I have reviewed the patients History and Physical, chart, labs and discussed the procedure including the risks, benefits and alternatives for the proposed anesthesia with the patient or authorized representative who has indicated his/her understanding and acceptance.  ? ? ? ?Dental advisory given ? ?Plan Discussed with: CRNA ? ?Anesthesia Plan Comments:   ? ? ? ? ? ?Anesthesia Quick Evaluation ? ?

## 2021-06-25 NOTE — H&P (Signed)
Taylor Ritter ?HPI: An EGD on 06/21/2007 was performed for complaints of dysphagia.  No abnormalities were identified and no dilation was performed.  She does complain about having sinus issues.  Her symptoms started over a year ago and she reports that the pandemic kept her at home.  ? ?Past Medical History:  ?Diagnosis Date  ? Allergy   ? takes Allegra daily and uses nasal spray daily.Takes Benadryl as needed.Uses eye drops daily  ? Anemia   ? B 12 shots monthly  ? Anxiety   ? takes Xanax as needed  ? Arthritis   ? osteoporosis  ? Asthma   ? Proventil inhaler as needed  ? Chronic back pain   ? DDD and stenosis  ? Complication of anesthesia   ? pt states she was woke up during surgery  ? Depression   ? takes Wellbutrin daily  ? Fibromyalgia   ? GERD (gastroesophageal reflux disease)   ? takes Nexium daily  ? Headache(784.0)   ? takes Topamax daily as needed  ? History of bronchitis   ? 2016  ? History of colon polyps   ? benign  ? History of vertigo   ? took Meclizine several yrs ago  ? Hypertension   ? [facility]  ? IBS (irritable bowel syndrome)   ? takes LInzess daily  ? Insomnia   ? Joint pain   ? Muscle spasm   ? takes Flexeril daily as needed  ? Neuromuscular disorder (HCC)   ? numbness arms legs,feet   ? Nocturia   ? Osteoporosis   ? Pain   ? takes Keppra and Topamax daily  ? Peripheral edema   ? takes HCTZ daily  ? Sleep apnea   ? has a CPAP but doesn't wear often  ? Urinary frequency   ? Urinary incontinence   ? Urinary urgency   ? ? ?Past Surgical History:  ?Procedure Laterality Date  ? ABDOMINAL HYSTERECTOMY    ? ANTERIOR FUSION CERVICAL SPINE    ? BACK SURGERY  2012  ? bladder tacked    ? CARPAL TUNNEL RELEASE    ? R&L  ? [place]  ? CHOLECYSTECTOMY    ? COLONOSCOPY    ? COLONOSCOPY WITH PROPOFOL N/A 10/06/2017  ? Procedure: COLONOSCOPY WITH PROPOFOL;  Surgeon: Carol Ada, MD;  Location: [facility];  Service: Endoscopy;  Laterality: N/A;  ?  COLONOSCOPY WITH PROPOFOL N/A 12/18/2020  ? Procedure: COLONOSCOPY WITH PROPOFOL;  Surgeon: Carol Ada, MD;  Location: [facility];  Service: Endoscopy;  Laterality: N/A;  ? ESOPHAGEAL DILATION    ? KNEE ARTHROSCOPY    ? L KNEE  ? LUMBAR LAMINECTOMY    ? LUMBAR LAMINECTOMY/DECOMPRESSION MICRODISCECTOMY  02/25/2011  ? Procedure: LUMBAR LAMINECTOMY/DECOMPRESSION MICRODISCECTOMY;  Surgeon: Floyce Stakes;  Location: [place] [facility];  Service: Neurosurgery;  Laterality: N/A;  Lumbar Four-Five Microdiscectomy  ? LUMBAR WOUND DEBRIDEMENT  03/18/2011  ? Procedure: LUMBAR WOUND DEBRIDEMENT;  Surgeon: Floyce Stakes;  Location: [place] [facility];  Service: Neurosurgery;  Laterality: N/A;  Exploration of Lumbar wound  ? POLYPECTOMY  10/06/2017  ? Procedure: POLYPECTOMY;  Surgeon: Carol Ada, MD;  [facility]: [facility];  Service: Endoscopy;;  ? POLYPECTOMY  12/18/2020  ? Procedure: POLYPECTOMY;  Surgeon: Carol Ada, MD;  Location: [facility];  Service: Endoscopy;;  ? SEPTOPLASTY  10+yrs ago  ? SPINAL CORD STIMULATOR INSERTION N/A 12/11/2015  ? Procedure: LUMBAR SPINAL CORD STIMULATOR INSERTION;  Surgeon: Eddie Dibbles  Maryjean Ka, MD;  Location: [place] [facility];  Service: Neurosurgery;  Laterality: N/A;  ? ? ?Family History  ?Problem Relation Age of Onset  ? Arthritis Mother   ? Hypertension Mother   ? Kidney disease Mother   ? COPD Mother   ? Heart disease Mother 70  ?     CAD  ? Diabetes Mother   ? Mental illness Mother   ? Arthritis Father   ? Diabetes Father   ? Heart disease Father   ? Cancer Father   ?     prostate cancer  ? Mental illness Father   ?     Alzheimers Dementia  ? Cancer Maternal Grandmother   ?     brain tumor  ? Cancer Maternal Grandfather   ?     lung  ? Diabetes Maternal Grandfather   ? Early death Brother   ? Anesthesia problems Neg Hx   ? Hypotension Neg Hx   ? Malignant hyperthermia Neg Hx   ? Pseudochol deficiency Neg Hx   ? ? ?Social History:  reports that she has never smoked. She has never been exposed to  tobacco smoke. She has never used smokeless tobacco. She reports that she does not drink alcohol and does not use drugs. ? ?Allergies:  ?Allergies  ?Allergen Reactions  ? Other Other (See Comments)  ?  Nylon sutures had to remove to allow healing ? ?Vicryl sutures - do not use  ? Percocet [Oxycodone-Acetaminophen] Other (See Comments)  ?  "makes her crazy"  ? Augmentin [Amoxicillin-Pot Clavulanate] Other (See Comments)  ?  Unknown reaction  ? Codeine Hives  ? Dye Fdc Red [Red Dye] Hives and Other (See Comments)  ?  Scan dye, broke out in hives all over once with a neck scan  ? Morphine And Related Hives and Itching  ? Vesicare [Solifenacin Succinate] Itching  ? ? ?Medications: Scheduled: ?Continuous: ? ?No results found for this or any previous visit (from the past 24 hour(s)).  ? ?No results found. ? ?ROS:  As stated above in the HPI otherwise negative. ? ?There were no vitals taken for this visit.   ? ?PE: ?Gen: NAD, Alert and Oriented ?HEENT:  Taylor Ritter, Taylor Ritter ?Neck: Supple, no LAD ?Lungs: CTA Bilaterally ?CV: RRR without M/G/R ?ABD: Soft, NTND, +BS ?Ext: No C/C/E ? ?Assessment/Plan: ?1) Dysphagia - EGD with dilation. ? ?Ashantee Deupree D ?06/25/2021, 8:10 AM  ? ? ?  ? ?

## 2021-07-01 NOTE — Anesthesia Postprocedure Evaluation (Signed)
Anesthesia Post Note ? ?Patient: Taylor Ritter ? ?Procedure(s) Performed: ESOPHAGOGASTRODUODENOSCOPY (EGD) WITH PROPOFOL ?SAVORY DILATION ? ?  ? ?Patient location during evaluation: Endoscopy ?Anesthesia Type: MAC ?Level of consciousness: awake ?Pain management: pain level controlled ?Vital Signs Assessment: post-procedure vital signs reviewed and stable ?Respiratory status: spontaneous breathing ?Cardiovascular status: stable ?Postop Assessment: no apparent nausea or vomiting ?Anesthetic complications: no ? ? ?No notable events documented. ? ?Last Vitals:  ?Vitals:  ? 06/25/21 0920 06/25/21 0930  ?BP: (!) 97/54 95/65  ?Pulse: 80 77  ?Resp: 18 16  ?Temp:    ?SpO2: 94% 95%  ?  ?Last Pain:  ?Vitals:  ? 06/25/21 0930  ?TempSrc:   ?PainSc: 0-No pain  ? ? ?  ?  ?  ?  ?  ?  ? ?Huston Foley ? ? ? ? ?

## 2021-07-09 DIAGNOSIS — K219 Gastro-esophageal reflux disease without esophagitis: Secondary | ICD-10-CM | POA: Diagnosis not present

## 2021-07-09 DIAGNOSIS — R0989 Other specified symptoms and signs involving the circulatory and respiratory systems: Secondary | ICD-10-CM | POA: Diagnosis not present

## 2021-07-09 DIAGNOSIS — R0982 Postnasal drip: Secondary | ICD-10-CM | POA: Diagnosis not present

## 2021-07-09 DIAGNOSIS — J3089 Other allergic rhinitis: Secondary | ICD-10-CM | POA: Diagnosis not present

## 2021-07-09 DIAGNOSIS — R490 Dysphonia: Secondary | ICD-10-CM | POA: Diagnosis not present

## 2021-09-22 ENCOUNTER — Ambulatory Visit: Payer: Medicare PPO | Admitting: Family Medicine

## 2021-09-22 ENCOUNTER — Encounter: Payer: Self-pay | Admitting: Family Medicine

## 2021-09-22 VITALS — BP 128/80 | HR 84 | Temp 98.1°F | Ht 66.0 in | Wt 293.1 lb

## 2021-09-22 DIAGNOSIS — H539 Unspecified visual disturbance: Secondary | ICD-10-CM | POA: Diagnosis not present

## 2021-09-22 DIAGNOSIS — E1165 Type 2 diabetes mellitus with hyperglycemia: Secondary | ICD-10-CM

## 2021-09-22 DIAGNOSIS — R252 Cramp and spasm: Secondary | ICD-10-CM

## 2021-09-22 DIAGNOSIS — R600 Localized edema: Secondary | ICD-10-CM | POA: Diagnosis not present

## 2021-09-22 DIAGNOSIS — I10 Essential (primary) hypertension: Secondary | ICD-10-CM | POA: Diagnosis not present

## 2021-09-22 DIAGNOSIS — Z6841 Body Mass Index (BMI) 40.0 and over, adult: Secondary | ICD-10-CM | POA: Diagnosis not present

## 2021-09-22 DIAGNOSIS — R519 Headache, unspecified: Secondary | ICD-10-CM | POA: Diagnosis not present

## 2021-09-22 DIAGNOSIS — Z9689 Presence of other specified functional implants: Secondary | ICD-10-CM | POA: Diagnosis not present

## 2021-09-22 HISTORY — DX: Type 2 diabetes mellitus with hyperglycemia: E11.65

## 2021-09-22 LAB — POCT GLYCOSYLATED HEMOGLOBIN (HGB A1C): Hemoglobin A1C: 6.5 % — AB (ref 4.0–5.6)

## 2021-09-22 MED ORDER — FUROSEMIDE 20 MG PO TABS: ORAL_TABLET | ORAL | 1 refills | Status: DC

## 2021-09-22 NOTE — Patient Instructions (Signed)
A1C 6.5%.   keep up the good work

## 2021-09-22 NOTE — Progress Notes (Signed)
Established Patient Office Visit  Subjective   Patient ID: Taylor Ritter, female    DOB: [birth year]  Age: 61 y.o. MRN: [id number]  Chief Complaint  Patient presents with   Follow-up    HPI   Here for medical follow-up.  Type 2 diabetes.  She has done excellent job with reducing sodas and reducing sweet tea.  She is virtually eliminated tea.  Still drinks 1 soda per day.  Recent A1c 6.7%.  She actually has not taken metformin at all since last visit.  She states she has had some bilateral leg edema worse late day.  No orthopnea.  No dyspnea at rest.  Fairly sedentary.  Friend gave her Lasix 20 mg which she took and this seemed to help.  She tries to watch her sodium intake.  Hydrates usually with at least 3 L/day.  She is on HCTZ at baseline does take potassium supplement.  Is having some leg cramps especially at night.  Recent electrolytes reviewed and potassium was normal.  This has been low in the past.  She has not tried any magnesium supplement.  She has hypertension and this is well controlled.  No recent dizziness.  Past Medical History:  Diagnosis Date   Allergy    takes Allegra daily and uses nasal spray daily.Takes Benadryl as needed.Uses eye drops daily   Anemia    B 12 shots monthly   Anxiety    takes Xanax as needed   Arthritis    osteoporosis   Asthma    Proventil inhaler as needed   Chronic back pain    DDD and stenosis   Complication of anesthesia    pt states she was woke up during surgery   Depression    takes Wellbutrin daily   Fibromyalgia    GERD (gastroesophageal reflux disease)    takes Nexium daily   Headache(784.0)    takes Topamax daily as needed   History of bronchitis    2016   History of colon polyps    benign   History of vertigo    took Meclizine several yrs ago   Hypertension    followed by [facility]   IBS (irritable bowel syndrome)    takes LInzess daily   Insomnia    Joint pain    Muscle spasm     takes Flexeril daily as needed   Neuromuscular disorder (HCC)    numbness arms legs,feet    Nocturia    Osteoporosis    Pain    takes Keppra and Topamax daily   Peripheral edema    takes HCTZ daily   Sleep apnea    has a CPAP but doesn't wear often   Type 2 diabetes mellitus with hyperglycemia ([place]) 09/22/2021   Urinary frequency    Urinary incontinence    Urinary urgency    Past Surgical History:  Procedure Laterality Date   ABDOMINAL HYSTERECTOMY     ANTERIOR FUSION CERVICAL SPINE     BACK SURGERY  2012   bladder tacked     CARPAL TUNNEL RELEASE     R&L   CESAREAN SECTION  1983   CHOLECYSTECTOMY     COLONOSCOPY     COLONOSCOPY WITH PROPOFOL N/A 10/06/2017   Procedure: COLONOSCOPY WITH PROPOFOL;  Surgeon: Carol Ada, MD;  [facility];  Service: Endoscopy;  Laterality: N/A;   COLONOSCOPY WITH PROPOFOL N/A 12/18/2020   Procedure: COLONOSCOPY WITH PROPOFOL;  Surgeon: Carol Ada, MD;  [facility];  Service: Endoscopy;  Laterality: N/A;   ESOPHAGEAL DILATION     ESOPHAGOGASTRODUODENOSCOPY (EGD) WITH PROPOFOL N/A 06/25/2021   Procedure: ESOPHAGOGASTRODUODENOSCOPY (EGD) WITH PROPOFOL;  Surgeon: Carol Ada, MD;  [facility];  Service: Endoscopy;  Laterality: N/A;   KNEE ARTHROSCOPY     L KNEE   LUMBAR LAMINECTOMY     LUMBAR LAMINECTOMY/DECOMPRESSION MICRODISCECTOMY  02/25/2011   Procedure: LUMBAR LAMINECTOMY/DECOMPRESSION MICRODISCECTOMY;  Surgeon: Floyce Stakes;  Location: [place] [facility];  Service: Neurosurgery;  Laterality: N/A;  Lumbar Four-Five Microdiscectomy   LUMBAR WOUND DEBRIDEMENT  03/18/2011   Procedure: LUMBAR WOUND DEBRIDEMENT;  Surgeon: Floyce Stakes;  Location: [place] [facility];  Service: Neurosurgery;  Laterality: N/A;  Exploration of Lumbar wound   POLYPECTOMY  10/06/2017   Procedure: POLYPECTOMY;  Surgeon: Carol Ada, MD;  [facility];  Service: Endoscopy;;   POLYPECTOMY  12/18/2020   Procedure: POLYPECTOMY;   Surgeon: Carol Ada, MD;  [facility];  Service: Endoscopy;;   SAVORY DILATION N/A 06/25/2021   Procedure: Azzie [place] DILATION;  Surgeon: Carol Ada, MD;  [facility];  Service: Endoscopy;  Laterality: N/A;   SEPTOPLASTY  10+yrs ago   SPINAL CORD STIMULATOR INSERTION N/A 12/11/2015   Procedure: LUMBAR SPINAL CORD STIMULATOR INSERTION;  Surgeon: Clydell Hakim, MD;  Location: [place] [facility];  Service: Neurosurgery;  Laterality: N/A;    reports that she has never smoked. She has never been exposed to tobacco smoke. She has never used smokeless tobacco. She reports that she does not drink alcohol and does not use drugs. family history includes Arthritis in her father and mother; COPD in her mother; Cancer in her father, maternal grandfather, and maternal grandmother; Diabetes in her father, maternal grandfather, and mother; Early death in her brother; Heart disease in her father; Heart disease (age of onset: 63) in her mother; Hypertension in her mother; Kidney disease in her mother; Mental illness in her father and mother. Allergies  Allergen Reactions   Other Other (See Comments)    Nylon sutures had to remove to allow healing  Vicryl sutures - do not use   Percocet [Oxycodone-Acetaminophen] Other (See Comments)    "makes her crazy"   Augmentin [Amoxicillin-Pot Clavulanate] Other (See Comments)    Unknown reaction   Codeine Hives   Dye Fdc Red [Red Dye] Hives and Other (See Comments)    Scan dye, broke out in hives all over once with a neck scan   Morphine And Related Hives and Itching   Vesicare [Solifenacin Succinate] Itching    Review of Systems  Constitutional:  Negative for malaise/fatigue.  Eyes:  Negative for blurred vision.  Respiratory:  Negative for shortness of breath.   Cardiovascular:  Negative for chest pain.  Genitourinary:  Negative for dysuria.  Neurological:  Negative for dizziness, weakness and headaches.      Objective:     BP 128/80 (BP  Location: Left Arm, Patient Position: Sitting, Cuff Size: Large)   Pulse 84   Temp 98.1 F (36.7 C) (Oral)   Ht '5\' 6"'$  (1.676 m)   Wt 293 lb 1.6 oz (132.9 kg)   SpO2 94%   BMI 47.31 kg/m  BP Readings from Last 3 Encounters:  09/22/21 128/80  06/25/21 95/65  06/23/21 110/70   Wt Readings from Last 3 Encounters:  09/22/21 293 lb 1.6 oz (132.9 kg)  06/25/21 279 lb 5.2 oz (126.7 kg)  06/23/21 279 lb 6.4 oz (126.7 kg)      Physical Exam Constitutional:  Appearance: She is well-developed.  Eyes:     Pupils: Pupils are equal, round, and reactive to light.  Neck:     Thyroid: No thyromegaly.     Vascular: No JVD.  Cardiovascular:     Rate and Rhythm: Normal rate and regular rhythm.     Heart sounds:     No gallop.  Pulmonary:     Effort: Pulmonary effort is normal. No respiratory distress.     Breath sounds: Normal breath sounds. No wheezing or rales.  Musculoskeletal:     Cervical back: Neck supple.     Right lower leg: No edema.     Left lower leg: No edema.  Neurological:     Mental Status: She is alert.      Results for orders placed or performed in visit on 09/22/21  POC HgB A1c  Result Value Ref Range   Hemoglobin A1C 6.5 (A) 4.0 - 5.6 %   HbA1c POC (<> result, manual entry)     HbA1c, POC (prediabetic range)     HbA1c, POC (controlled diabetic range)      Last CBC Lab Results  Component Value Date   WBC 9.4 09/10/2019   HGB 14.3 12/18/2020   HCT 42.0 12/18/2020   MCV 96.3 09/10/2019   MCH 30.9 09/10/2019   RDW 12.9 09/10/2019   PLT 265 35/57/3220   Last metabolic panel Lab Results  Component Value Date   GLUCOSE 106 (H) 06/23/2021   NA 140 06/23/2021   K 3.6 06/23/2021   CL 103 06/23/2021   CO2 26 06/23/2021   BUN 13 06/23/2021   CREATININE 1.14 06/23/2021   GFRNONAA 44 (L) 09/10/2019   CALCIUM 9.8 06/23/2021   PROT 6.9 12/24/2018   ALBUMIN 4.2 12/24/2018   LABGLOB 2.7 12/24/2018   AGRATIO 1.6 12/24/2018   BILITOT 0.3 12/24/2018    ALKPHOS 103 12/24/2018   AST 40 12/24/2018   ALT 31 12/24/2018   ANIONGAP 13 09/10/2019   Last hemoglobin A1c Lab Results  Component Value Date   HGBA1C 6.5 (A) 09/22/2021      The 10-year ASCVD risk score (Arnett DK, et al., 2019) is: 10.1%    Assessment & Plan:   #1 type 2 diabetes improved with recent dietary changes.  She has managed to reduce her A1c from 6.7% to 6.5% without any metformin through dietary changes.  Continue current lifestyle changes and recheck in 3 months  #2 hypertension stable and at goal.  Continue HCTZ and continue potassium supplementation  #3 intermittent peripheral edema.  Watch sodium intake.  Elevate legs frequently.  Consider compression.  Wrote for limited furosemide 20 mg once daily as needed for increased edema.  Watch for any new symptoms such as dyspnea   Return in about 3 months (around 12/23/2021).    Carolann Littler, MD

## 2021-09-24 ENCOUNTER — Telehealth: Payer: Self-pay | Admitting: Family Medicine

## 2021-09-24 NOTE — Telephone Encounter (Signed)
Pt called to state her CPAP mask is broken and she needs a Rx for a replacement mask  Please send to:  [facility] in [place] and there number is:  [phone]

## 2021-09-27 ENCOUNTER — Other Ambulatory Visit (HOSPITAL_COMMUNITY): Payer: Self-pay | Admitting: Pain Medicine

## 2021-09-27 ENCOUNTER — Other Ambulatory Visit: Payer: Self-pay | Admitting: Pain Medicine

## 2021-09-27 DIAGNOSIS — G4486 Cervicogenic headache: Secondary | ICD-10-CM

## 2021-09-29 NOTE — Telephone Encounter (Signed)
Pt called to say she just called [facility] and was told they have not received anything yet. Please advise.  Pt stated [facility] told her they just faxed over request again.

## 2021-09-29 NOTE — Telephone Encounter (Signed)
I spoke with Taylor Ritter at [facility] and she stated mo rx was required and stated that pt would need to reach out to them to speak with pulmonary therapist regarding next steps. Pt informed of this.

## 2021-09-29 NOTE — Telephone Encounter (Signed)
I spoke with receptionist at [facility] and she stated that forms will be faxed to our office. Awaiting forms

## 2021-10-04 ENCOUNTER — Telehealth: Payer: Self-pay | Admitting: Family Medicine

## 2021-10-04 DIAGNOSIS — L821 Other seborrheic keratosis: Secondary | ICD-10-CM | POA: Diagnosis not present

## 2021-10-04 DIAGNOSIS — D225 Melanocytic nevi of trunk: Secondary | ICD-10-CM | POA: Diagnosis not present

## 2021-10-04 DIAGNOSIS — L579 Skin changes due to chronic exposure to nonionizing radiation, unspecified: Secondary | ICD-10-CM | POA: Diagnosis not present

## 2021-10-04 DIAGNOSIS — L303 Infective dermatitis: Secondary | ICD-10-CM | POA: Diagnosis not present

## 2021-10-04 DIAGNOSIS — L814 Other melanin hyperpigmentation: Secondary | ICD-10-CM | POA: Diagnosis not present

## 2021-10-04 DIAGNOSIS — D235 Other benign neoplasm of skin of trunk: Secondary | ICD-10-CM | POA: Diagnosis not present

## 2021-10-04 NOTE — Telephone Encounter (Signed)
I spoke with Amy at [facility] and she stated the person who handles CPAP machines is currently not available and would tell her to give call back to office in order to have forms faxed.

## 2021-10-04 NOTE — Telephone Encounter (Signed)
I spoke with Amy with [facility] and she stated the person who handles CPAP machines is currently not available and would return my call. Correct fax number was given to Amy and she stated that she would relay my message and have forms sent over. Currently awaiting forms

## 2021-10-04 NOTE — Telephone Encounter (Signed)
Pt called to state her CPAP mask is broken and she needs a Rx for a replacement mask. Per previous notes, someone spoke with the DME company and they stated they sent request on 09/29/21. Request is not in system.     [facility] in [place] and there number is:   [phone]

## 2021-10-04 NOTE — Telephone Encounter (Signed)
Pt called to follow up on CPAP mask. Pt is very concerned, cannot sleep, difficulty breathing at night and needs this resolved as soon as possible. Pt has been waiting over 2 weeks and is feeling very ill without her CPAP.   Pt stated it may be possible that [facility] is sending paperwork to wrong fax number.  Please advise.  Also, Pt is requesting a refill of the:  nystatin (MYCOSTATIN/NYSTOP) powder  [id number] [place] [address]  [phone] Fax:  [phone]

## 2021-10-05 MED ORDER — NYSTATIN 100000 UNIT/GM EX POWD: Freq: Four times a day (QID) | CUTANEOUS | 1 refills | Status: DC

## 2021-10-05 NOTE — Telephone Encounter (Signed)
Rx sent 

## 2021-10-05 NOTE — Addendum Note (Signed)
Addended by: Nilda Riggs on: 10/05/2021 07:54 AM   Modules accepted: Orders

## 2021-10-05 NOTE — Telephone Encounter (Signed)
Forms have been received and faxed to [facility]. Pt is aware

## 2021-10-06 ENCOUNTER — Ambulatory Visit (HOSPITAL_COMMUNITY)
Admission: RE | Admit: 2021-10-06 | Discharge: 2021-10-06 | Disposition: A | Discharge status: Home / Self Care | Payer: Medicare PPO | Source: Ambulatory Visit | Attending: Pain Medicine | Admitting: Pain Medicine

## 2021-10-06 DIAGNOSIS — G4486 Cervicogenic headache: Secondary | ICD-10-CM | POA: Insufficient documentation

## 2021-10-06 DIAGNOSIS — R519 Headache, unspecified: Secondary | ICD-10-CM | POA: Diagnosis not present

## 2021-10-07 ENCOUNTER — Other Ambulatory Visit: Payer: Self-pay | Admitting: Family Medicine

## 2021-10-07 NOTE — Telephone Encounter (Signed)
Last refill- 06/09/21-30 tabs, 0 refills Last OV- 09/22/21  Next OV-01/05/22.

## 2021-10-13 ENCOUNTER — Other Ambulatory Visit: Payer: Self-pay | Admitting: Obstetrics and Gynecology

## 2021-10-13 ENCOUNTER — Other Ambulatory Visit: Payer: Self-pay | Admitting: Family Medicine

## 2021-10-13 DIAGNOSIS — B372 Candidiasis of skin and nail: Secondary | ICD-10-CM

## 2021-10-14 NOTE — Telephone Encounter (Signed)
Last annual exam was 12/21 No exam scheduled Message sent to appointment to schedule

## 2021-10-18 NOTE — Telephone Encounter (Signed)
Taylor Ritter said patient has medicare and was told to come every 2 years. Taylor Ritter did try to schedule medication visit appointment. Patient declined, reports she does want to pay for 2 visits. asked if she can just have refill and schedule annual exam for 02/2022? Please advise

## 2021-10-18 NOTE — Telephone Encounter (Signed)
Nystatin refill sent

## 2021-10-25 ENCOUNTER — Other Ambulatory Visit: Payer: Self-pay | Admitting: Family Medicine

## 2021-10-25 DIAGNOSIS — R7303 Prediabetes: Secondary | ICD-10-CM

## 2021-11-29 DIAGNOSIS — M47812 Spondylosis without myelopathy or radiculopathy, cervical region: Secondary | ICD-10-CM | POA: Diagnosis not present

## 2021-11-29 DIAGNOSIS — M4802 Spinal stenosis, cervical region: Secondary | ICD-10-CM | POA: Diagnosis not present

## 2021-11-29 DIAGNOSIS — M9901 Segmental and somatic dysfunction of cervical region: Secondary | ICD-10-CM | POA: Diagnosis not present

## 2021-12-06 DIAGNOSIS — M4802 Spinal stenosis, cervical region: Secondary | ICD-10-CM | POA: Diagnosis not present

## 2021-12-06 DIAGNOSIS — M47812 Spondylosis without myelopathy or radiculopathy, cervical region: Secondary | ICD-10-CM | POA: Diagnosis not present

## 2021-12-06 DIAGNOSIS — M9901 Segmental and somatic dysfunction of cervical region: Secondary | ICD-10-CM | POA: Diagnosis not present

## 2021-12-08 DIAGNOSIS — M9901 Segmental and somatic dysfunction of cervical region: Secondary | ICD-10-CM | POA: Diagnosis not present

## 2021-12-08 DIAGNOSIS — M47812 Spondylosis without myelopathy or radiculopathy, cervical region: Secondary | ICD-10-CM | POA: Diagnosis not present

## 2021-12-08 DIAGNOSIS — M4802 Spinal stenosis, cervical region: Secondary | ICD-10-CM | POA: Diagnosis not present

## 2021-12-19 ENCOUNTER — Other Ambulatory Visit: Payer: Self-pay | Admitting: Family Medicine

## 2021-12-19 DIAGNOSIS — I10 Essential (primary) hypertension: Secondary | ICD-10-CM

## 2022-01-05 ENCOUNTER — Encounter: Payer: Self-pay | Admitting: Family Medicine

## 2022-01-05 ENCOUNTER — Ambulatory Visit: Payer: Medicare PPO | Admitting: Family Medicine

## 2022-01-05 VITALS — BP 106/62 | HR 80 | Temp 98.3°F | Ht 66.0 in | Wt 279.1 lb

## 2022-01-05 DIAGNOSIS — R7303 Prediabetes: Secondary | ICD-10-CM

## 2022-01-05 DIAGNOSIS — I10 Essential (primary) hypertension: Secondary | ICD-10-CM

## 2022-01-05 DIAGNOSIS — E1165 Type 2 diabetes mellitus with hyperglycemia: Secondary | ICD-10-CM

## 2022-01-05 DIAGNOSIS — E538 Deficiency of other specified B group vitamins: Secondary | ICD-10-CM

## 2022-01-05 DIAGNOSIS — F339 Major depressive disorder, recurrent, unspecified: Secondary | ICD-10-CM

## 2022-01-05 LAB — COMPREHENSIVE METABOLIC PANEL
ALT: 26 U/L (ref 0–35)
AST: 36 U/L (ref 0–37)
Albumin: 4.3 g/dL (ref 3.5–5.2)
Alkaline Phosphatase: 86 U/L (ref 39–117)
BUN: 13 mg/dL (ref 6–23)
CO2: 30 mEq/L (ref 19–32)
Calcium: 9.7 mg/dL (ref 8.4–10.5)
Chloride: 99 mEq/L (ref 96–112)
Creatinine, Ser: 1.19 mg/dL (ref 0.40–1.20)
GFR: 49.1 mL/min — ABNORMAL LOW (ref >60.00)
Glucose, Bld: 111 mg/dL — ABNORMAL HIGH (ref 70–99)
Potassium: 3.8 mEq/L (ref 3.5–5.1)
Sodium: 137 mEq/L (ref 135–145)
Total Bilirubin: 0.5 mg/dL (ref 0.2–1.2)
Total Protein: 7.8 g/dL (ref 6.0–8.3)

## 2022-01-05 LAB — POCT GLYCOSYLATED HEMOGLOBIN (HGB A1C): Hemoglobin A1C: 5.9 % — AB (ref 4.0–5.6)

## 2022-01-05 LAB — LIPID PANEL
Cholesterol: 157 mg/dL (ref 0–200)
HDL: 50 mg/dL (ref >39.00)
LDL Cholesterol: 81 mg/dL (ref 0–99)
NonHDL: 107.45
Total CHOL/HDL Ratio: 3
Triglycerides: 134 mg/dL (ref 0.0–149.0)
VLDL: 26.8 mg/dL (ref 0.0–40.0)

## 2022-01-05 LAB — VITAMIN B12: Vitamin B-12: 801 pg/mL (ref 211–911)

## 2022-01-05 MED ORDER — MONTELUKAST SODIUM 10 MG PO TABS: 10.0000 mg | ORAL_TABLET | Freq: Every day | ORAL | 3 refills | Status: DC

## 2022-01-05 MED ORDER — HYDROCHLOROTHIAZIDE 25 MG PO TABS: 25.0000 mg | ORAL_TABLET | Freq: Every day | ORAL | 3 refills | Status: DC

## 2022-01-05 MED ORDER — VILAZODONE HCL 40 MG PO TABS: 40.0000 mg | ORAL_TABLET | Freq: Every day | ORAL | 3 refills | Status: DC

## 2022-01-05 MED ORDER — BUPROPION HCL ER (XL) 150 MG PO TB24: ORAL_TABLET | ORAL | 3 refills | Status: DC

## 2022-01-05 MED ORDER — POTASSIUM CHLORIDE ER 10 MEQ PO TBCR: EXTENDED_RELEASE_TABLET | ORAL | 3 refills | Status: DC

## 2022-01-05 MED ORDER — NYSTATIN 100000 UNIT/GM EX POWD: Freq: Four times a day (QID) | CUTANEOUS | 2 refills | Status: AC

## 2022-01-05 NOTE — Progress Notes (Signed)
Established Patient Office Visit  Subjective   Patient ID: Taylor Ritter, female    DOB: [birth year]  Age: 61 y.o. MRN: [id number]  Chief Complaint  Patient presents with   Follow-up    HPI   Taylor Ritter is seen for medical follow-up.  She has history of hypertension, GERD, IBS, type 2 diabetes osteoarthritis, recurrent depression, morbid obesity, B12 deficiency.  She has done a good job with dietary modification.  She has given up soft drinks was drinking about 3/day now 1/day.  Lost 14 pounds since last visit.  Last A1c was 6.5% down to 5.9% today.  She has not been taking metformin.  She has history of B12 deficiency.  Takes replacement.  Request follow-up level.  Has not had a lipid in some time.  Blood pressures been very well controlled on HCTZ.  She has history of recurrent depression currently stable on Viibryd and Wellbutrin.  Past Medical History:  Diagnosis Date   Allergy    takes Allegra daily and uses nasal spray daily.Takes Benadryl as needed.Uses eye drops daily   Anemia    B 12 shots monthly   Anxiety    takes Xanax as needed   Arthritis    osteoporosis   Asthma    Proventil inhaler as needed   Chronic back pain    DDD and stenosis   Complication of anesthesia    pt states she was woke up during surgery   Depression    takes Wellbutrin daily   Fibromyalgia    GERD (gastroesophageal reflux disease)    takes Nexium daily   Headache(784.0)    takes Topamax daily as needed   History of bronchitis    2016   History of colon polyps    benign   History of vertigo    took Meclizine several yrs ago   Hypertension    followed by [facility]   IBS (irritable bowel syndrome)    takes LInzess daily   Insomnia    Joint pain    Muscle spasm    takes Flexeril daily as needed   Neuromuscular disorder (HCC)    numbness arms legs,feet    Nocturia    Osteoporosis    Pain    takes Keppra and Topamax daily   Peripheral edema    takes HCTZ  daily   Sleep apnea    has a CPAP but doesn't wear often   Type 2 diabetes mellitus with hyperglycemia ([place]) 09/22/2021   Urinary frequency    Urinary incontinence    Urinary urgency    Past Surgical History:  Procedure Laterality Date   ABDOMINAL HYSTERECTOMY     ANTERIOR FUSION CERVICAL SPINE     BACK SURGERY  2012   bladder tacked     CARPAL TUNNEL RELEASE     R&L   CESAREAN SECTION  1983   CHOLECYSTECTOMY     COLONOSCOPY     COLONOSCOPY WITH PROPOFOL N/A 10/06/2017   Procedure: COLONOSCOPY WITH PROPOFOL;  Surgeon: Carol Ada, MD;  Location: [facility];  Service: Endoscopy;  Laterality: N/A;   COLONOSCOPY WITH PROPOFOL N/A 12/18/2020   Procedure: COLONOSCOPY WITH PROPOFOL;  Surgeon: Carol Ada, MD;  Location: [facility];  Service: Endoscopy;  Laterality: N/A;   ESOPHAGEAL DILATION     ESOPHAGOGASTRODUODENOSCOPY (EGD) WITH PROPOFOL N/A 06/25/2021   Procedure: ESOPHAGOGASTRODUODENOSCOPY (EGD) WITH PROPOFOL;  Surgeon: Carol Ada, MD;  Location: [facility];  Service: Endoscopy;  Laterality: N/A;   KNEE ARTHROSCOPY  L KNEE   LUMBAR LAMINECTOMY     LUMBAR LAMINECTOMY/DECOMPRESSION MICRODISCECTOMY  02/25/2011   Procedure: LUMBAR LAMINECTOMY/DECOMPRESSION MICRODISCECTOMY;  Surgeon: Floyce Stakes;  Location: [facility];  Service: Neurosurgery;  Laterality: N/A;  Lumbar Four-Five Microdiscectomy   LUMBAR WOUND DEBRIDEMENT  03/18/2011   Procedure: LUMBAR WOUND DEBRIDEMENT;  Surgeon: Floyce Stakes;  Location: [facility];  Service: Neurosurgery;  Laterality: N/A;  Exploration of Lumbar wound   POLYPECTOMY  10/06/2017   Procedure: POLYPECTOMY;  Surgeon: Carol Ada, MD;  Location: [facility];  Service: Endoscopy;;   POLYPECTOMY  12/18/2020   Procedure: POLYPECTOMY;  Surgeon: Carol Ada, MD;  Location: [facility];  Service: Endoscopy;;   SAVORY DILATION N/A 06/25/2021   Procedure: Azzie Almas DILATION;  Surgeon: Carol Ada, MD;  Location: [facility];  Service:  Endoscopy;  Laterality: N/A;   SEPTOPLASTY  10+yrs ago   SPINAL CORD STIMULATOR INSERTION N/A 12/11/2015   Procedure: LUMBAR SPINAL CORD STIMULATOR INSERTION;  Surgeon: Clydell Hakim, MD;  Location: [facility];  Service: Neurosurgery;  Laterality: N/A;    reports that she has never smoked. She has never been exposed to tobacco smoke. She has never used smokeless tobacco. She reports that she does not drink alcohol and does not use drugs. family history includes Arthritis in her father and mother; COPD in her mother; Cancer in her father, maternal grandfather, and maternal grandmother; Diabetes in her father, maternal grandfather, and mother; Early death in her brother; Heart disease in her father; Heart disease (age of onset: 78) in her mother; Hypertension in her mother; Kidney disease in her mother; Mental illness in her father and mother. Allergies  Allergen Reactions   Other Other (See Comments)    Nylon sutures had to remove to allow healing  Vicryl sutures - do not use   Percocet [Oxycodone-Acetaminophen] Other (See Comments)    "makes her crazy"   Augmentin [Amoxicillin-Pot Clavulanate] Other (See Comments)    Unknown reaction   Codeine Hives   Dye Fdc Red [Red Dye] Hives and Other (See Comments)    Scan dye, broke out in hives all over once with a neck scan   Morphine And Related Hives and Itching   Vesicare [Solifenacin Succinate] Itching    Review of Systems  Constitutional:  Negative for malaise/fatigue.  Eyes:  Negative for blurred vision.  Respiratory:  Negative for shortness of breath.   Cardiovascular:  Negative for chest pain.  Gastrointestinal:  Negative for abdominal pain.  Neurological:  Negative for dizziness, weakness and headaches.      Objective:     BP 106/62 (BP Location: Left Arm, Patient Position: Sitting, Cuff Size: Large)   Pulse 80   Temp 98.3 F (36.8 C) (Oral)   Ht '5\' 6"'$  (1.676 m)   Wt 279 lb 1.6 oz (126.6 kg)   SpO2 98%   BMI 45.05 kg/m     Physical Exam Vitals reviewed.  Constitutional:      Appearance: She is well-developed.  Eyes:     Pupils: Pupils are equal, round, and reactive to light.  Neck:     Thyroid: No thyromegaly.     Vascular: No JVD.  Cardiovascular:     Rate and Rhythm: Normal rate and regular rhythm.     Heart sounds:     No gallop.  Pulmonary:     Effort: Pulmonary effort is normal. No respiratory distress.     Breath sounds: Normal breath sounds. No wheezing or rales.  Musculoskeletal:  Cervical back: Neck supple.  Neurological:     Mental Status: She is alert.      Results for orders placed or performed in visit on 01/05/22  POCT glycosylated hemoglobin (Hb A1C)  Result Value Ref Range   Hemoglobin A1C 5.9 (A) 4.0 - 5.6 %   HbA1c POC (<> result, manual entry)     HbA1c, POC (prediabetic range)     HbA1c, POC (controlled diabetic range)        The ASCVD Risk score (Arnett DK, et al., 2019) failed to calculate for the following reasons:   Cannot find a previous HDL lab   Cannot find a previous total cholesterol lab    Assessment & Plan:   Problem List Items Addressed This Visit       Unprioritized   Type 2 diabetes mellitus with hyperglycemia ([place]) - Primary   Relevant Orders   POCT glycosylated hemoglobin (Hb A1C) (Completed)   Lipid panel   Prediabetes   Vitamin B 12 deficiency   Relevant Orders   Vitamin B12   Hypertension   Relevant Medications   hydrochlorothiazide (HYDRODIURIL) 25 MG tablet   Other Relevant Orders   CMP  -Blood sugars controlled with A1c 5.9%.  We took metformin off list since she has not been on this and has controlled her blood sugars with weight loss.  Continue low glycemic diet.  Continue weight loss efforts -Check lipid panel, B12, comprehensive metabolic panel -Flu vaccine offered but declined -Refilled HCTZ for 1 year -Continue over-the-counter B12 replacement -Her depression is stable.  We refilled her Wellbutrin and Viibryd for 1  year  Return in about 6 months (around 07/07/2022).    Carolann Littler, MD

## 2022-01-18 ENCOUNTER — Telehealth: Payer: Self-pay | Admitting: *Deleted

## 2022-01-18 ENCOUNTER — Encounter: Payer: Self-pay | Admitting: *Deleted

## 2022-01-18 NOTE — Patient Outreach (Signed)
  Care Coordination   Initial Visit Note   01/18/2022 Name: Taylor Ritter MRN: [id number] DOB: [birth year]  Taylor Ritter is a 61 y.o. year old female who sees Burchette, Alinda Sierras, MD for primary care. I spoke with  Hanley Seamen by phone today.  What matters to the patients health and wellness today?  No needs    Goals Addressed               This Visit's Progress     COMPLETED: "Nothing today" (pt-stated)        Care Coordination Interventions: Reviewed medications with patient and discussed adherence with all medications Reviewed scheduled/upcoming provider appointments including pending appointments Screening for signs and symptoms of depression related to chronic disease state  Assessed social determinant of health barriers          SDOH assessments and interventions completed:  Yes  SDOH Interventions Today    Flowsheet Row Most Recent Value  SDOH Interventions   Food Insecurity Interventions Intervention Not Indicated  Housing Interventions Intervention Not Indicated  Transportation Interventions Intervention Not Indicated  Utilities Interventions Intervention Not Indicated        Care Coordination Interventions Activated:  Yes  Care Coordination Interventions:  Yes, provided   Follow up plan: No further intervention required.   Encounter Outcome:  Pt. Visit Completed   Raina Mina, RN Care Management Coordinator [place] Office [phone]

## 2022-01-18 NOTE — Patient Instructions (Signed)
Visit Information  Thank you for taking time to visit with me today. Please don't hesitate to contact me if I can be of assistance to you.   Following are the goals we discussed today:   Goals Addressed               This Visit's Progress     COMPLETED: "Nothing today" (pt-stated)        Care Coordination Interventions: Reviewed medications with patient and discussed adherence with all medications Reviewed scheduled/upcoming provider appointments including pending appointments Screening for signs and symptoms of depression related to chronic disease state  Assessed social determinant of health barriers          Please call the care guide team at [phone] if you need to cancel or reschedule your appointment.   If you are experiencing a Mental Health or [place] or need someone to talk to, please call the Suicide and Crisis Lifeline: 988  Patient verbalizes understanding of instructions and care plan provided today and agrees to view in [place]. Active MyChart status and patient understanding of how to access instructions and care plan via MyChart confirmed with patient.     No further follow up required: No needs  Raina Mina, RN Care Management Coordinator [place] Office [phone]

## 2022-01-21 ENCOUNTER — Other Ambulatory Visit: Payer: Self-pay | Admitting: Family Medicine

## 2022-01-21 DIAGNOSIS — R7303 Prediabetes: Secondary | ICD-10-CM

## 2022-02-07 ENCOUNTER — Other Ambulatory Visit: Payer: Self-pay | Admitting: Family Medicine

## 2022-02-07 DIAGNOSIS — R7303 Prediabetes: Secondary | ICD-10-CM

## 2022-03-02 ENCOUNTER — Other Ambulatory Visit: Payer: Self-pay | Admitting: Family Medicine

## 2022-03-07 ENCOUNTER — Other Ambulatory Visit: Payer: Self-pay | Admitting: Family Medicine

## 2022-03-08 MED ORDER — ALPRAZOLAM 1 MG PO TABS: ORAL_TABLET | ORAL | 0 refills | Status: DC

## 2022-03-17 ENCOUNTER — Other Ambulatory Visit: Payer: Self-pay | Admitting: Family Medicine

## 2022-04-18 DIAGNOSIS — E6609 Other obesity due to excess calories: Secondary | ICD-10-CM | POA: Diagnosis not present

## 2022-04-18 DIAGNOSIS — K219 Gastro-esophageal reflux disease without esophagitis: Secondary | ICD-10-CM | POA: Diagnosis not present

## 2022-05-05 ENCOUNTER — Encounter: Payer: Medicare PPO | Admitting: Family Medicine

## 2022-05-05 NOTE — Patient Instructions (Signed)
I really enjoyed getting to talk with you today! I am available on Tuesdays and Thursdays for virtual visits if you have any questions or concerns, or if I can be of any further assistance.   CHECKLIST FROM ANNUAL WELLNESS VISIT:  -Follow up (please call to schedule if not scheduled after visit):  -Inperson visit with your Primary Doctor office: -yearly for annual wellness visit with primary care office  Here is a list of your preventive care/health maintenance measures and the plan for each if any are due:  Health Maintenance  Topic Date Due   FOOT EXAM  Never done   OPHTHALMOLOGY EXAM  Never done   Diabetic kidney evaluation - Urine ACR  Never done   Zoster Vaccines- Shingrix (1 of 2) Never done   COVID-19 Vaccine (4 - 2023-24 season) 11/19/2021   INFLUENZA VACCINE  06/19/2022 (Originally 10/19/2021)   Hepatitis C Screening  09/23/2022 (Originally 10/16/1977)   HIV Screening  09/23/2022 (Originally 10/17/1974)   PAP SMEAR-Modifier  04/06/2035 (Originally 08/03/2014)   MAMMOGRAM  06/05/2022   HEMOGLOBIN A1C  07/07/2022   DTaP/Tdap/Td (2 - Td or Tdap) 08/10/2022   Diabetic kidney evaluation - eGFR measurement  01/06/2023   Medicare Annual Wellness (AWV)  05/06/2023   COLONOSCOPY (Pts 45-74yr Insurance coverage will need to be confirmed)  12/18/2025   HPV VACCINES  Aged Out    -See a dentist at least yearly  -Get your eyes checked and then per your eye specialist's recommendations  -Other issues addressed today: -  -I have included below further information regarding a healthy whole foods based diet, physical activity guidelines for adults, stress management and opportunities for social connections. I hope you find this information useful.    -----------------------------------------------------------------------------------------------------------------------------------------------------------------------------------------------------------------------------------------------------------  NUTRITION: -eat real food: lots of colorful vegetables (half the plate) and fruits -5-7 servings of vegetables and fruits per day (fresh or steamed is best), exp. 2 servings of vegetables with lunch and dinner and 2 servings of fruit per day. Berries and greens such as kale and collards are great choices.  -consume on a regular basis: whole grains (make sure first ingredient on label contains the word "whole"), fresh fruits, fish, nuts, seeds, healthy oils (such as olive oil, avocado oil, grape seed oil) -may eat small amounts of dairy and lean meat on occasion, but avoid processed meats such as ham, bacon, lunch meat, etc. -drink water -try to avoid fast food and pre-packaged foods, processed meat -most experts advise limiting sodium to < 23024mper day, should limit further is any chronic conditions such as high blood pressure, heart disease, diabetes, etc. The American Heart Association advised that < 15005ms is ideal -try to avoid foods that contain any ingredients with names you do not recognize  -try to avoid sugar/sweets (except for the natural sugar that occurs in fresh fruit) -try to avoid sweet drinks -try to avoid white rice, white bread, pasta (unless whole grain), white or yellow potatoes  EXERCISE GUIDELINES FOR ADULTS: -if you wish to increase your physical activity, do so gradually and with the approval of your doctor -STOP and seek medical care immediately if you have any chest pain, chest discomfort or trouble breathing when starting or increasing exercise  -move and stretch your body, legs, feet and arms when sitting for long periods -Physical activity guidelines for optimal health in adults: -least 150 minutes per week of  aerobic exercise (can talk, but not sing) once approved by your doctor, 20-30 minutes of sustained activity or two  10 minute episodes of sustained activity every day.  -resistance training at least 2 days per week if approved by your doctor -balance exercises 3+ days per week:   Stand somewhere where you have something sturdy to hold onto if you lose balance.    1) lift up on toes, start with 5x per day and work up to 20x   2) stand and lift on leg straight out to the side so that foot is a few inches of the floor, start with 5x each side and work up to 20x each side   3) stand on one foot, start with 5 seconds each side and work up to 20 seconds on each side  If you need ideas or help with getting more active:  -Silver sneakers https://tools.silversneakers.com  -Walk with a Doc: http://stephens-thompson.biz/  -try to include resistance (weight lifting/strength building) and balance exercises twice per week: or the following link for ideas: ChessContest.fr  UpdateClothing.com.cy  STRESS MANAGEMENT: -can try meditating, or just sitting quietly with deep breathing while intentionally relaxing all parts of your body for 5 minutes daily -if you need further help with stress, anxiety or depression please follow up with your primary doctor or contact the wonderful folks at [place]: [place]: -options in [place] if you wish to engage in more social and exercise related activities:  -Silver sneakers https://tools.silversneakers.com  -Walk with a Doc: http://stephens-thompson.biz/  -Check out the [place] 50+ section on the [place] [employer] (hiking clubs, book clubs, cards and games, chess, exercise classes, aquatic classes and much more) - see the website for  details: https://www.[place].gov/departments/parks-recreation/active-adults50  -YouTube has lots of exercise videos for different ages and abilities as well  -[place] (a variety of indoor and outdoor inperson activities for adults). (832)694-2324. [address].  -Virtual Online Classes (a variety of topics): see seniorplanet.org or call [phone]  -consider volunteering at a school, hospice center, church, senior center or elsewhere

## 2022-05-05 NOTE — Progress Notes (Signed)
  Per staff, pt requested to reschedule when contacted for check in.

## 2022-05-12 ENCOUNTER — Other Ambulatory Visit: Payer: Self-pay | Admitting: Pulmonary Disease

## 2022-05-12 DIAGNOSIS — M9902 Segmental and somatic dysfunction of thoracic region: Secondary | ICD-10-CM | POA: Diagnosis not present

## 2022-05-12 DIAGNOSIS — R0982 Postnasal drip: Secondary | ICD-10-CM

## 2022-05-12 DIAGNOSIS — M47812 Spondylosis without myelopathy or radiculopathy, cervical region: Secondary | ICD-10-CM | POA: Diagnosis not present

## 2022-05-12 DIAGNOSIS — M546 Pain in thoracic spine: Secondary | ICD-10-CM | POA: Diagnosis not present

## 2022-05-12 DIAGNOSIS — M9903 Segmental and somatic dysfunction of lumbar region: Secondary | ICD-10-CM | POA: Diagnosis not present

## 2022-05-12 DIAGNOSIS — M9901 Segmental and somatic dysfunction of cervical region: Secondary | ICD-10-CM | POA: Diagnosis not present

## 2022-05-12 DIAGNOSIS — M4802 Spinal stenosis, cervical region: Secondary | ICD-10-CM | POA: Diagnosis not present

## 2022-05-12 DIAGNOSIS — M47816 Spondylosis without myelopathy or radiculopathy, lumbar region: Secondary | ICD-10-CM | POA: Diagnosis not present

## 2022-05-13 ENCOUNTER — Other Ambulatory Visit: Payer: Self-pay | Admitting: Pulmonary Disease

## 2022-05-13 DIAGNOSIS — R0982 Postnasal drip: Secondary | ICD-10-CM

## 2022-05-19 DIAGNOSIS — M546 Pain in thoracic spine: Secondary | ICD-10-CM | POA: Diagnosis not present

## 2022-05-19 DIAGNOSIS — M47816 Spondylosis without myelopathy or radiculopathy, lumbar region: Secondary | ICD-10-CM | POA: Diagnosis not present

## 2022-05-19 DIAGNOSIS — M9901 Segmental and somatic dysfunction of cervical region: Secondary | ICD-10-CM | POA: Diagnosis not present

## 2022-05-19 DIAGNOSIS — M4802 Spinal stenosis, cervical region: Secondary | ICD-10-CM | POA: Diagnosis not present

## 2022-05-19 DIAGNOSIS — M9903 Segmental and somatic dysfunction of lumbar region: Secondary | ICD-10-CM | POA: Diagnosis not present

## 2022-05-19 DIAGNOSIS — M9902 Segmental and somatic dysfunction of thoracic region: Secondary | ICD-10-CM | POA: Diagnosis not present

## 2022-05-19 DIAGNOSIS — M47812 Spondylosis without myelopathy or radiculopathy, cervical region: Secondary | ICD-10-CM | POA: Diagnosis not present

## 2022-06-20 DIAGNOSIS — M4802 Spinal stenosis, cervical region: Secondary | ICD-10-CM | POA: Diagnosis not present

## 2022-06-20 DIAGNOSIS — M546 Pain in thoracic spine: Secondary | ICD-10-CM | POA: Diagnosis not present

## 2022-06-20 DIAGNOSIS — M9902 Segmental and somatic dysfunction of thoracic region: Secondary | ICD-10-CM | POA: Diagnosis not present

## 2022-06-20 DIAGNOSIS — M47812 Spondylosis without myelopathy or radiculopathy, cervical region: Secondary | ICD-10-CM | POA: Diagnosis not present

## 2022-06-20 DIAGNOSIS — M47816 Spondylosis without myelopathy or radiculopathy, lumbar region: Secondary | ICD-10-CM | POA: Diagnosis not present

## 2022-06-20 DIAGNOSIS — M9903 Segmental and somatic dysfunction of lumbar region: Secondary | ICD-10-CM | POA: Diagnosis not present

## 2022-06-20 DIAGNOSIS — M9901 Segmental and somatic dysfunction of cervical region: Secondary | ICD-10-CM | POA: Diagnosis not present

## 2022-06-22 ENCOUNTER — Telehealth: Payer: Self-pay | Admitting: Family Medicine

## 2022-06-22 NOTE — Telephone Encounter (Signed)
Contacted Taylor Ritter to schedule their annual wellness visit. Appointment made for 06/29/22.  [name] [facility] direct phone # [phone]

## 2022-06-24 ENCOUNTER — Ambulatory Visit: Payer: Medicare PPO | Admitting: Family Medicine

## 2022-06-24 ENCOUNTER — Encounter: Payer: Self-pay | Admitting: Family Medicine

## 2022-06-24 VITALS — BP 120/76 | HR 99 | Temp 98.6°F | Ht 66.0 in | Wt 268.0 lb

## 2022-06-24 DIAGNOSIS — Z1152 Encounter for screening for COVID-19: Secondary | ICD-10-CM

## 2022-06-24 DIAGNOSIS — J0100 Acute maxillary sinusitis, unspecified: Secondary | ICD-10-CM | POA: Diagnosis not present

## 2022-06-24 DIAGNOSIS — R6889 Other general symptoms and signs: Secondary | ICD-10-CM

## 2022-06-24 DIAGNOSIS — R55 Syncope and collapse: Secondary | ICD-10-CM

## 2022-06-24 DIAGNOSIS — R059 Cough, unspecified: Secondary | ICD-10-CM

## 2022-06-24 LAB — POCT INFLUENZA A/B
Influenza A, POC: NEGATIVE
Influenza B, POC: NEGATIVE

## 2022-06-24 LAB — POC COVID19 BINAXNOW: SARS Coronavirus 2 Ag: NEGATIVE

## 2022-06-24 MED ORDER — DOXYCYCLINE HYCLATE 100 MG PO TABS: 100.0000 mg | ORAL_TABLET | Freq: Two times a day (BID) | ORAL | 0 refills | Status: DC

## 2022-06-24 MED ORDER — BENZONATATE 100 MG PO CAPS: 100.0000 mg | ORAL_CAPSULE | Freq: Three times a day (TID) | ORAL | 0 refills | Status: DC | PRN

## 2022-06-24 NOTE — Patient Instructions (Signed)
Sorry that you are sick.  Lungs are clear today, and your vital signs are reassuring.  Start doxycycline for possible early sinus infection, continue to drink plenty of fluids and rest.  Mucinex or Mucinex DM is fine for cough.  Tessalon Perles if needed.  If any worsening shortness of breath, fevers, or other worsening symptoms over the weekend proceed to the urgent care or emergency room for further evaluation and possible blood work or chest x-ray as we discussed.  Also be seen through the ER or call 911 if you have another episode of passing out.  I do not expect that to occur.  I hope you feel better soon!  Sinus Infection, Adult A sinus infection, also called sinusitis, is inflammation of your sinuses. Sinuses are hollow spaces in the bones around your face. Your sinuses are located: Around your eyes. In the middle of your forehead. Behind your nose. In your cheekbones. Mucus normally drains out of your sinuses. When your nasal tissues become inflamed or swollen, mucus can become trapped or blocked. This allows bacteria, viruses, and fungi to grow, which leads to infection. Most infections of the sinuses are caused by a virus. A sinus infection can develop quickly. It can last for up to 4 weeks (acute) or for more than 12 weeks (chronic). A sinus infection often develops after a cold. What are the causes? This condition is caused by anything that creates swelling in the sinuses or stops mucus from draining. This includes: Allergies. Asthma. Infection from bacteria or viruses. Deformities or blockages in your nose or sinuses. Abnormal growths in the nose (nasal polyps). Pollutants, such as chemicals or irritants in the air. Infection from fungi. This is rare. What increases the risk? You are more likely to develop this condition if you: Have a weak body defense system (immune system). Do a lot of swimming or diving. Overuse nasal sprays. Smoke. What are the signs or symptoms? The main  symptoms of this condition are pain and a feeling of pressure around the affected sinuses. Other symptoms include: Stuffy nose or congestion that makes it difficult to breathe through your nose. Thick yellow or greenish drainage from your nose. Tenderness, swelling, and warmth over the affected sinuses. A cough that may get worse at night. Decreased sense of smell and taste. Extra mucus that collects in the throat or the back of the nose (postnasal drip) causing a sore throat or bad breath. Tiredness (fatigue). Fever. How is this diagnosed? This condition is diagnosed based on: Your symptoms. Your medical history. A physical exam. Tests to find out if your condition is acute or chronic. This may include: Checking your nose for nasal polyps. Viewing your sinuses using a device that has a light (endoscope). Testing for allergies or bacteria. Imaging tests, such as an MRI or CT scan. In rare cases, a bone biopsy may be done to rule out more serious types of fungal sinus disease. How is this treated? Treatment for a sinus infection depends on the cause and whether your condition is chronic or acute. If caused by a virus, your symptoms should go away on their own within 10 days. You may be given medicines to relieve symptoms. They include: Medicines that shrink swollen nasal passages (decongestants). A spray that eases inflammation of the nostrils (topical intranasal corticosteroids). Rinses that help get rid of thick mucus in your nose (nasal saline washes). Medicines that treat allergies (antihistamines). Over-the-counter pain relievers. If caused by bacteria, your health care provider may recommend waiting  to see if your symptoms improve. Most bacterial infections will get better without antibiotic medicine. You may be given antibiotics if you have: A severe infection. A weak immune system. If caused by narrow nasal passages or nasal polyps, surgery may be needed. Follow these  instructions at home: Medicines Take, use, or apply over-the-counter and prescription medicines only as told by your health care provider. These may include nasal sprays. If you were prescribed an antibiotic medicine, take it as told by your health care provider. Do not stop taking the antibiotic even if you start to feel better. Hydrate and humidify  Drink enough fluid to keep your urine pale yellow. Staying hydrated will help to thin your mucus. Use a cool mist humidifier to keep the humidity level in your home above 50%. Inhale steam for 10-15 minutes, 3-4 times a day, or as told by your health care provider. You can do this in the bathroom while a hot shower is running. Limit your exposure to cool or dry air. Rest Rest as much as possible. Sleep with your head raised (elevated). Make sure you get enough sleep each night. General instructions  Apply a warm, moist washcloth to your face 3-4 times a day or as told by your health care provider. This will help with discomfort. Use nasal saline washes as often as told by your health care provider. Wash your hands often with soap and water to reduce your exposure to germs. If soap and water are not available, use hand sanitizer. Do not smoke. Avoid being around people who are smoking (secondhand smoke). Keep all follow-up visits. This is important. Contact a health care provider if: You have a fever. Your symptoms get worse. Your symptoms do not improve within 10 days. Get help right away if: You have a severe headache. You have persistent vomiting. You have severe pain or swelling around your face or eyes. You have vision problems. You develop confusion. Your neck is stiff. You have trouble breathing. These symptoms may be an emergency. Get help right away. Call 911. Do not wait to see if the symptoms will go away. Do not drive yourself to the hospital. Summary A sinus infection is soreness and inflammation of your sinuses. Sinuses  are hollow spaces in the bones around your face. This condition is caused by nasal tissues that become inflamed or swollen. The swelling traps or blocks the flow of mucus. This allows bacteria, viruses, and fungi to grow, which leads to infection. If you were prescribed an antibiotic medicine, take it as told by your health care provider. Do not stop taking the antibiotic even if you start to feel better. Keep all follow-up visits. This is important. This information is not intended to replace advice given to you by your health care provider. Make sure you discuss any questions you have with your health care provider. Document Revised: 02/09/2021 Document Reviewed: 02/09/2021 [employer] Patient Education  2023 [employer]   Cough, Adult Coughing is a reflex that clears your throat and airways (respiratory system). It helps heal and protect your lungs. It is normal to cough from time to time. A cough that happens with other symptoms or that lasts a long time may be a sign of a condition that needs treatment. A short-term (acute) cough may only last 2-3 weeks. A long-term (chronic) cough may last 8 or more weeks. Coughing is often caused by: Diseases, such as: An infection of the respiratory system. Asthma or other heart or lung diseases. Gastroesophageal reflux.  This is when acid comes back up from the stomach. Breathing in things that irritate your lungs. Allergies. Postnasal drip. This is when mucus runs down the back of your throat. Smoking. Some medicines. Follow these instructions at home: Medicines Take over-the-counter and prescription medicines only as told by your health care provider. Talk with your provider before you take cough medicine (cough suppressants). Eating and drinking Do not drink alcohol. Avoid caffeine. Drink enough fluid to keep your pee (urine) pale yellow. Lifestyle Avoid cigarette smoke. Do not use any products that contain nicotine or tobacco. These  products include cigarettes, chewing tobacco, and vaping devices, such as e-cigarettes. If you need help quitting, ask your provider. Avoid things that make you cough. These may include perfumes, candles, cleaning products, or campfire smoke. General instructions  Watch for any changes to your cough. Tell your provider about them. Always cover your mouth when you cough. If the air is dry in your bedroom or home, use a cool mist vaporizer or humidifier. If your cough is worse at night, try to sleep in a semi-upright position. Rest as needed. Contact a health care provider if: You have new symptoms, or your symptoms get worse. You cough up pus. You have a fever that does not go away or a cough that does not get better after 2-3 weeks. You cannot control your cough with medicine, and you are losing sleep. You have pain that gets worse or is not helped with medicine. You lose weight for no clear reason. You have night sweats. Get help right away if: You cough up blood. You have trouble breathing. Your heart is beating very fast. These symptoms may be an emergency. Get help right away. Call 911. Do not wait to see if the symptoms will go away. Do not drive yourself to the hospital. This information is not intended to replace advice given to you by your health care provider. Make sure you discuss any questions you have with your health care provider. Document Revised: 11/05/2021 Document Reviewed: 11/05/2021 [employer] Patient Education  2023 [employer]

## 2022-06-24 NOTE — Progress Notes (Signed)
Subjective:  Patient ID: Taylor Ritter, female    DOB: 12-31-1960  Age: 62 y.o. MRN: [id number]  CC:  Chief Complaint  Patient presents with   Cough    Pt states she has had a cough since Tuesday producing mucus, congestion, light headed, shortness of breath, loss of smell and taste. Pt states she blacked out and woke up on floor Tuesday.     HPI CAREL SCHNEE presents for   Cough: As above, cough started 4 days ago with congestion and cough, sore throat, chills, subjective fever.  No home testing or known sick contacts. No recent travel. Felt worse 3 days ago - more cough, shortness of breath with coughing, not oat rest or activity. Has felt lightheaded, dizzy at times. Blood sugar 136 when dizzy.  Took leftover prednisone 12 mg once on Tuesday.   She has loss of smell and taste this week.  reports syncopal episode 3 days ago - lightheaded when walking into kitchen. Fell to floor. Not witnessed.out for less than 1 minute. No injuries. No hx of seizure, no incontinence or tongue lesions.  Went back to bed. Blood sugar 199. No lows. No recurrence of syncopal or near-syncopal symptoms. Headache and head congestion. Upper teeth hurting. Discolored nasal d/c and green phlegm.  Has been drinking fluids, eating ok. 8 bottles water per day. No chest pains - just congestion with cough. Upper tooth pain.  Tx: mucinex DM, ibuprofen. Afrin once.     History Patient Active Problem List   Diagnosis Date Noted   Type 2 diabetes mellitus with hyperglycemia 09/22/2021   Prediabetes 12/25/2020   Change in bowel habit 02/28/2020   Irritable bowel syndrome with constipation 02/28/2020   Dysphagia 02/28/2020   Personal history of colonic polyps 02/28/2020   Lumbar spondylosis 09/26/2019   Lumbar post-laminectomy syndrome 09/26/2019   Lumbar facet joint syndrome 09/26/2019   Degeneration of lumbar intervertebral disc 09/26/2019   Muscle pain 09/26/2019   Sacroiliitis, not elsewhere classified  09/26/2019   Recurrent cold sores 10/03/2014   Vitamin B 12 deficiency 10/03/2014   Severe obesity (BMI >= 40) 08/25/2013   Asthma 02/28/2012   Osteoarthritis cervical spine 02/28/2012   Depression, recurrent 02/28/2012   Hypertension 02/28/2012   Recurrent UTI 02/28/2012   GERD (gastroesophageal reflux disease) 02/28/2012   Esophageal stricture 02/28/2012   Past Medical History:  Diagnosis Date   Allergy    takes Allegra daily and uses nasal spray daily.Takes Benadryl as needed.Uses eye drops daily   Anemia    B 12 shots monthly   Anxiety    takes Xanax as needed   Arthritis    osteoporosis   Asthma    Proventil inhaler as needed   Chronic back pain    DDD and stenosis   Complication of anesthesia    pt states she was woke up during surgery   Depression    takes Wellbutrin daily   Fibromyalgia    GERD (gastroesophageal reflux disease)    takes Nexium daily   Headache(784.0)    takes Topamax daily as needed   History of bronchitis    2016   History of colon polyps    benign   History of vertigo    took Meclizine several yrs ago   Hypertension    followed by [facility]   IBS (irritable bowel syndrome)    takes LInzess daily   Insomnia    Joint pain    Muscle spasm  takes Flexeril daily as needed   Neuromuscular disorder    numbness arms legs,feet    Nocturia    Osteoporosis    Pain    takes Keppra and Topamax daily   Peripheral edema    takes HCTZ daily   Sleep apnea    has a CPAP but doesn't wear often   Type 2 diabetes mellitus with hyperglycemia 09/22/2021   Urinary frequency    Urinary incontinence    Urinary urgency    Past Surgical History:  Procedure Laterality Date   ABDOMINAL HYSTERECTOMY     ANTERIOR FUSION CERVICAL SPINE     BACK SURGERY  2012   bladder tacked     CARPAL TUNNEL RELEASE     R&L   CESAREAN SECTION  1983   CHOLECYSTECTOMY     COLONOSCOPY     COLONOSCOPY WITH PROPOFOL N/A 10/06/2017   Procedure:  COLONOSCOPY WITH PROPOFOL;  Surgeon: Jeani Hawking, MD;  Location: [facility];  Service: Endoscopy;  Laterality: N/A;   COLONOSCOPY WITH PROPOFOL N/A 12/18/2020   Procedure: COLONOSCOPY WITH PROPOFOL;  Surgeon: Jeani Hawking, MD;  Location: [facility];  Service: Endoscopy;  Laterality: N/A;   ESOPHAGEAL DILATION     ESOPHAGOGASTRODUODENOSCOPY (EGD) WITH PROPOFOL N/A 06/25/2021   Procedure: ESOPHAGOGASTRODUODENOSCOPY (EGD) WITH PROPOFOL;  Surgeon: Jeani Hawking, MD;  Location: [facility];  Service: Endoscopy;  Laterality: N/A;   KNEE ARTHROSCOPY     L KNEE   LUMBAR LAMINECTOMY     LUMBAR LAMINECTOMY/DECOMPRESSION MICRODISCECTOMY  02/25/2011   Procedure: LUMBAR LAMINECTOMY/DECOMPRESSION MICRODISCECTOMY;  Surgeon: Karn Cassis;  Location: [facility];  Service: Neurosurgery;  Laterality: N/A;  Lumbar Four-Five Microdiscectomy   LUMBAR WOUND DEBRIDEMENT  03/18/2011   Procedure: LUMBAR WOUND DEBRIDEMENT;  Surgeon: Karn Cassis;  Location: [facility];  Service: Neurosurgery;  Laterality: N/A;  Exploration of Lumbar wound   POLYPECTOMY  10/06/2017   Procedure: POLYPECTOMY;  Surgeon: Jeani Hawking, MD;  Location: [facility];  Service: Endoscopy;;   POLYPECTOMY  12/18/2020   Procedure: POLYPECTOMY;  Surgeon: Jeani Hawking, MD;  Location: [facility];  Service: Endoscopy;;   SAVORY DILATION N/A 06/25/2021   Procedure: Gaspar Bidding DILATION;  Surgeon: Jeani Hawking, MD;  Location: [facility];  Service: Endoscopy;  Laterality: N/A;   SEPTOPLASTY  10+yrs ago   SPINAL CORD STIMULATOR INSERTION N/A 12/11/2015   Procedure: LUMBAR SPINAL CORD STIMULATOR INSERTION;  Surgeon: Odette Fraction, MD;  Location: [facility];  Service: Neurosurgery;  Laterality: N/A;   Allergies  Allergen Reactions   Other Other (See Comments)    Nylon sutures had to remove to allow healing  Vicryl sutures - do not use   Percocet [Oxycodone-Acetaminophen] Other (See Comments)    "makes her crazy"   Augmentin  [Amoxicillin-Pot Clavulanate] Other (See Comments)    Unknown reaction   Codeine Hives   Dye Fdc Red [Red Dye] Hives and Other (See Comments)    Scan dye, broke out in hives all over once with a neck scan   Morphine And Related Hives and Itching   Vesicare [Solifenacin Succinate] Itching   Prior to Admission medications   Medication Sig Start Date End Date Taking? Authorizing Provider  albuterol (VENTOLIN HFA) 108 (90 Base) MCG/ACT inhaler Inhale 1-2 puffs into the lungs every 6 (six) hours as needed for wheezing or shortness of breath. 07/19/19  Yes [provider]  ALPRAZolam Prudy Feeler) 1 MG tablet Take one tablet by mouth daily as needed for severe anxiety. 03/08/22  Yes Burchette,  Elberta Fortis, MD  buPROPion (WELLBUTRIN XL) 150 MG 24 hr tablet Take 3 tabs daily.  Please schedule a physical for more refills. thanks 01/05/22  Yes Burchette, Elberta Fortis, MD  cyanocobalamin (VITAMIN B12) 1000 MCG/ML injection INJECT 1 ML (1,000 MCG TOTAL) INTO THE MUSCLE EVERY 30 (THIRTY) DAYS. 03/04/22  Yes Burchette, Elberta Fortis, MD  cyclobenzaprine (FLEXERIL) 5 MG tablet Take 5 mg by mouth 2 (two) times daily as needed for muscle spasms.   Yes [provider]  diphenhydrAMINE (BENADRYL) 50 MG capsule Take 50 mg by mouth every 6 (six) hours as needed for allergies.   Yes [provider]  EPINEPHrine 0.3 mg/0.3 mL IJ SOAJ injection Inject 0.3 mg into the muscle as needed for anaphylaxis.   Yes [provider]  Estradiol 10 MCG TABS vaginal tablet Place 1 tablet (10 mcg total) vaginally 2 (two) times a week. Patient taking differently: Place 1 tablet vaginally daily as needed (vaginal dryness). 03/02/20  Yes Romualdo Bolk, MD  famotidine (PEPCID) 10 MG tablet Take 20 mg by mouth at bedtime.   Yes [provider]  fexofenadine (ALLEGRA) 180 MG tablet Take 180 mg by mouth daily.   Yes [provider]  fluticasone (FLONASE) 50 MCG/ACT nasal spray Place 1 spray into both  nostrils daily as needed for allergies. 05/12/22  Yes Martina Sinner, MD  furosemide (LASIX) 20 MG tablet TAKE ONE TABLET BY MOUTH DAILY AS NEEDED FOR EDEMA 03/17/22  Yes Burchette, Elberta Fortis, MD  GABAPENTIN PO Take by mouth. Take 2 tablets at bedtime   Yes [provider]  hydrochlorothiazide (HYDRODIURIL) 25 MG tablet Take 1 tablet (25 mg total) by mouth daily. 01/05/22  Yes Burchette, Elberta Fortis, MD  ibuprofen (ADVIL) 200 MG tablet Take 800 mg by mouth every 8 (eight) hours as needed (for pain.).   Yes [provider]  ipratropium (ATROVENT) 0.03 % nasal spray PLACE 2 SPRAYS INTO BOTH NOSTRILS EVERY 12 (TWELVE) HOURS. 05/12/22  Yes Martina Sinner, MD  levETIRAcetam (KEPPRA) 500 MG tablet Take 1,000 mg by mouth at bedtime.    Yes [provider]  linaclotide (LINZESS) 290 MCG CAPS capsule Take 580 mcg by mouth in the morning.   Yes [provider]  montelukast (SINGULAIR) 10 MG tablet Take 1 tablet (10 mg total) by mouth at bedtime. 01/05/22  Yes Burchette, Elberta Fortis, MD  Multiple Vitamin (MULTIVITAMIN WITH MINERALS) TABS tablet Take 1 tablet by mouth daily. One A Day for Women   Yes [provider]  NEXIUM 40 MG capsule TAKE 1 CAPSULE TWICE DAILY 03/04/22  Yes Burchette, Elberta Fortis, MD  nystatin (MYCOSTATIN/NYSTOP) powder Apply topically 4 (four) times daily. 01/05/22  Yes Burchette, Elberta Fortis, MD  nystatin ointment (MYCOSTATIN) APPLY 1 APPLICATION TOPICALLY 2 (TWO) TIMES DAILY AS NEEDED 10/18/21  Yes Romualdo Bolk, MD  pilocarpine (SALAGEN) 5 MG tablet Take 5 mg by mouth 2 (two) times daily as needed (dryness).   Yes [provider]  potassium chloride (KLOR-CON) 10 MEQ tablet Take 2 tablets by mouth once a day. 01/05/22  Yes Burchette, Elberta Fortis, MD  topiramate (TOPAMAX) 100 MG tablet Take 200 mg by mouth at bedtime.    Yes [provider]  Vilazodone HCl (VIIBRYD) 40 MG TABS Take 1 tablet (40 mg total) by mouth daily. 01/05/22  Yes  Burchette, Elberta Fortis, MD  vitamin B-12 (CYANOCOBALAMIN) 1000 MCG tablet Take 1,000 mcg by mouth in the morning.   Yes [provider]   Social History   Socioeconomic History   Marital status: Married    Spouse name: Not on file   Number of children: Not on file   Years of education: Not on file   Highest education level: Not on file  Occupational History   Not on file  Tobacco Use   Smoking status: Never    Passive exposure: Never   Smokeless tobacco: Never  Vaping Use   Vaping Use: Never used  Substance and Sexual Activity   Alcohol use: No   Drug use: No   Sexual activity: Not on file  Other Topics Concern   Not on file  Social History Narrative   Not on file   Social Determinants of Health   Financial Resource Strain: Low Risk  (05/04/2021)   Overall Financial Resource Strain (CARDIA)    Difficulty of Paying Living Expenses: Not hard at all  Food Insecurity: No Food Insecurity (01/18/2022)   Hunger Vital Sign    Worried About Running Out of Food in the Last Year: Never true    Ran Out of Food in the Last Year: Never true  Transportation Needs: No Transportation Needs (01/18/2022)   PRAPARE - Administrator, Civil Service (Medical): No    Lack of Transportation (Non-Medical): No  Physical Activity: Inactive (05/04/2021)   Exercise Vital Sign    Days of Exercise per Week: 0 days    Minutes of Exercise per Session: 0 min  Stress: Stress Concern Present (05/04/2021)   [employer] of Occupational Health - Occupational Stress Questionnaire    Feeling of Stress : To some extent  Social Connections: Moderately Isolated (05/26/2020)   Social Connection and Isolation Panel [NHANES]    Frequency of Communication with Friends and Family: Twice a week    Frequency of Social Gatherings with Friends and Family: Once a week    Attends Religious Services: Never    Database administrator or Organizations: No    Attends Banker Meetings: Never     Marital Status: Married  Catering manager Violence: Not At Risk (05/26/2020)   Humiliation, Afraid, Rape, and Kick questionnaire    Fear of Current or Ex-Partner: No    Emotionally Abused: No    Physically Abused: No    Sexually Abused: No    Review of Systems   Objective:   Vitals:   06/24/22 1111  BP: 120/76  Pulse: 99  Temp: 98.6 F (37 C)  TempSrc: Temporal  SpO2: 96%  Weight: 268 lb (121.6 kg)  Height: 5\' 6"  (1.676 m)   Physical Exam Vitals reviewed.  Constitutional:      General: She is not in acute distress.    Appearance: She is well-developed.  HENT:     Head: Normocephalic and atraumatic.     Right Ear: Hearing, tympanic membrane, ear canal and external ear normal.     Left Ear: Hearing, tympanic membrane, ear canal and external ear normal.     Nose: Nose normal.     Comments: Maxillary sinus ttp bilat.     Mouth/Throat:     Mouth: Mucous membranes are moist.     Pharynx: No oropharyngeal exudate or posterior oropharyngeal erythema.  Eyes:     Conjunctiva/sclera: Conjunctivae normal.     Pupils: Pupils are equal, round, and reactive to light.  Cardiovascular:     Rate and Rhythm: Normal rate and regular rhythm.     Heart sounds: Normal heart sounds. No murmur  heard. Pulmonary:     Effort: Pulmonary effort is normal. No respiratory distress.     Breath sounds: Normal breath sounds. Stridor present. No wheezing, rhonchi or rales.     Comments: Clear, normal effort.  Skin:    General: Skin is warm and dry.     Findings: No rash.  Neurological:     Mental Status: She is alert and oriented to person, place, and time.  Psychiatric:        Mood and Affect: Mood normal.        Behavior: Behavior normal.     Results for orders placed or performed in visit on 06/24/22  POCT Influenza A/B  Result Value Ref Range   Influenza A, POC Negative Negative   Influenza B, POC Negative Negative  POC COVID-19  Result Value Ref Range   SARS Coronavirus 2 Ag  Negative Negative    Assessment & Plan:  Mare Ferrarieresa S Gotsch is a 62 y.o. female . Cough, unspecified type  Encounter for screening for COVID-19 - Plan: POC COVID-19  Flu-like symptoms - Plan: POCT Influenza A/B  Acute maxillary sinusitis, recurrence not specified  4 day history of cough, congestion, upper respiratory infection with progression to increased cough, some lower respiratory congestion but clear on exam and reassuring vital signs.  Single episode of syncope possible vasovagal, denies active coughing during event.  Denies any history of seizures and history does not sound like seizure.  No recurrence of syncope or near syncopal symptoms past few days.  Overall reassuring exam in office, negative COVID and flu testing.  With worsening symptoms, sinus pain, discolored phlegm and tooth pain, early sinusitis possible and will start doxycycline.  She has taken this prior without reaction or concern, red dye allergy noted.  Tessalon Perles as needed for cough, continue Mucinex, fluids, rest and ER precautions given if any worsening cough, fever, shortness of breath or return of syncope/near syncopal symptoms.  Understanding of plan expressed.   No orders of the defined types were placed in this encounter.  Patient Instructions  Sorry that you are sick.  Lungs are clear today, and your vital signs are reassuring.  Start doxycycline for possible early sinus infection, continue to drink plenty of fluids and rest.  Mucinex or Mucinex DM is fine for cough.  Tessalon Perles if needed.  If any worsening shortness of breath, fevers, or other worsening symptoms over the weekend proceed to the urgent care or emergency room for further evaluation and possible blood work or chest x-ray as we discussed.  Also be seen through the ER or call 911 if you have another episode of passing out.  I do not expect that to occur.  I hope you feel better soon!  Sinus Infection, Adult A sinus infection, also called  sinusitis, is inflammation of your sinuses. Sinuses are hollow spaces in the bones around your face. Your sinuses are located: Around your eyes. In the middle of your forehead. Behind your nose. In your cheekbones. Mucus normally drains out of your sinuses. When your nasal tissues become inflamed or swollen, mucus can become trapped or blocked. This allows bacteria, viruses, and fungi to grow, which leads to infection. Most infections of the sinuses are caused by a virus. A sinus infection can develop quickly. It can last for up to 4 weeks (acute) or for more than 12 weeks (chronic). A sinus infection often develops after a cold. What are the causes? This condition is caused by anything that creates swelling  in the sinuses or stops mucus from draining. This includes: Allergies. Asthma. Infection from bacteria or viruses. Deformities or blockages in your nose or sinuses. Abnormal growths in the nose (nasal polyps). Pollutants, such as chemicals or irritants in the air. Infection from fungi. This is rare. What increases the risk? You are more likely to develop this condition if you: Have a weak body defense system (immune system). Do a lot of swimming or diving. Overuse nasal sprays. Smoke. What are the signs or symptoms? The main symptoms of this condition are pain and a feeling of pressure around the affected sinuses. Other symptoms include: Stuffy nose or congestion that makes it difficult to breathe through your nose. Thick yellow or greenish drainage from your nose. Tenderness, swelling, and warmth over the affected sinuses. A cough that may get worse at night. Decreased sense of smell and taste. Extra mucus that collects in the throat or the back of the nose (postnasal drip) causing a sore throat or bad breath. Tiredness (fatigue). Fever. How is this diagnosed? This condition is diagnosed based on: Your symptoms. Your medical history. A physical exam. Tests to find out if  your condition is acute or chronic. This may include: Checking your nose for nasal polyps. Viewing your sinuses using a device that has a light (endoscope). Testing for allergies or bacteria. Imaging tests, such as an MRI or CT scan. In rare cases, a bone biopsy may be done to rule out more serious types of fungal sinus disease. How is this treated? Treatment for a sinus infection depends on the cause and whether your condition is chronic or acute. If caused by a virus, your symptoms should go away on their own within 10 days. You may be given medicines to relieve symptoms. They include: Medicines that shrink swollen nasal passages (decongestants). A spray that eases inflammation of the nostrils (topical intranasal corticosteroids). Rinses that help get rid of thick mucus in your nose (nasal saline washes). Medicines that treat allergies (antihistamines). Over-the-counter pain relievers. If caused by bacteria, your health care provider may recommend waiting to see if your symptoms improve. Most bacterial infections will get better without antibiotic medicine. You may be given antibiotics if you have: A severe infection. A weak immune system. If caused by narrow nasal passages or nasal polyps, surgery may be needed. Follow these instructions at home: Medicines Take, use, or apply over-the-counter and prescription medicines only as told by your health care provider. These may include nasal sprays. If you were prescribed an antibiotic medicine, take it as told by your health care provider. Do not stop taking the antibiotic even if you start to feel better. Hydrate and humidify  Drink enough fluid to keep your urine pale yellow. Staying hydrated will help to thin your mucus. Use a cool mist humidifier to keep the humidity level in your home above 50%. Inhale steam for 10-15 minutes, 3-4 times a day, or as told by your health care provider. You can do this in the bathroom while a hot shower is  running. Limit your exposure to cool or dry air. Rest Rest as much as possible. Sleep with your head raised (elevated). Make sure you get enough sleep each night. General instructions  Apply a warm, moist washcloth to your face 3-4 times a day or as told by your health care provider. This will help with discomfort. Use nasal saline washes as often as told by your health care provider. Wash your hands often with soap and water to reduce  your exposure to germs. If soap and water are not available, use hand sanitizer. Do not smoke. Avoid being around people who are smoking (secondhand smoke). Keep all follow-up visits. This is important. Contact a health care provider if: You have a fever. Your symptoms get worse. Your symptoms do not improve within 10 days. Get help right away if: You have a severe headache. You have persistent vomiting. You have severe pain or swelling around your face or eyes. You have vision problems. You develop confusion. Your neck is stiff. You have trouble breathing. These symptoms may be an emergency. Get help right away. Call 911. Do not wait to see if the symptoms will go away. Do not drive yourself to the hospital. Summary A sinus infection is soreness and inflammation of your sinuses. Sinuses are hollow spaces in the bones around your face. This condition is caused by nasal tissues that become inflamed or swollen. The swelling traps or blocks the flow of mucus. This allows bacteria, viruses, and fungi to grow, which leads to infection. If you were prescribed an antibiotic medicine, take it as told by your health care provider. Do not stop taking the antibiotic even if you start to feel better. Keep all follow-up visits. This is important. This information is not intended to replace advice given to you by your health care provider. Make sure you discuss any questions you have with your health care provider. Document Revised: 02/09/2021 Document Reviewed:  02/09/2021 Elsevier Patient Education  2023 Elsevier Inc.   Cough, Adult Coughing is a reflex that clears your throat and airways (respiratory system). It helps heal and protect your lungs. It is normal to cough from time to time. A cough that happens with other symptoms or that lasts a long time may be a sign of a condition that needs treatment. A short-term (acute) cough may only last 2-3 weeks. A long-term (chronic) cough may last 8 or more weeks. Coughing is often caused by: Diseases, such as: An infection of the respiratory system. Asthma or other heart or lung diseases. Gastroesophageal reflux. This is when acid comes back up from the stomach. Breathing in things that irritate your lungs. Allergies. Postnasal drip. This is when mucus runs down the back of your throat. Smoking. Some medicines. Follow these instructions at home: Medicines Take over-the-counter and prescription medicines only as told by your health care provider. Talk with your provider before you take cough medicine (cough suppressants). Eating and drinking Do not drink alcohol. Avoid caffeine. Drink enough fluid to keep your pee (urine) pale yellow. Lifestyle Avoid cigarette smoke. Do not use any products that contain nicotine or tobacco. These products include cigarettes, chewing tobacco, and vaping devices, such as e-cigarettes. If you need help quitting, ask your provider. Avoid things that make you cough. These may include perfumes, candles, cleaning products, or campfire smoke. General instructions  Watch for any changes to your cough. Tell your provider about them. Always cover your mouth when you cough. If the air is dry in your bedroom or home, use a cool mist vaporizer or humidifier. If your cough is worse at night, try to sleep in a semi-upright position. Rest as needed. Contact a health care provider if: You have new symptoms, or your symptoms get worse. You cough up pus. You have a fever that  does not go away or a cough that does not get better after 2-3 weeks. You cannot control your cough with medicine, and you are losing sleep. You have pain that  gets worse or is not helped with medicine. You lose weight for no clear reason. You have night sweats. Get help right away if: You cough up blood. You have trouble breathing. Your heart is beating very fast. These symptoms may be an emergency. Get help right away. Call 911. Do not wait to see if the symptoms will go away. Do not drive yourself to the hospital. This information is not intended to replace advice given to you by your health care provider. Make sure you discuss any questions you have with your health care provider. Document Revised: 11/05/2021 Document Reviewed: 11/05/2021 Elsevier Patient Education  2023 Elsevier Inc.     Signed,   Meredith Staggers, MD [facility], [facility] 06/24/22 12:20 PM

## 2022-06-29 ENCOUNTER — Ambulatory Visit (INDEPENDENT_AMBULATORY_CARE_PROVIDER_SITE_OTHER): Payer: Medicare PPO

## 2022-06-29 VITALS — Ht 66.0 in | Wt 268.0 lb

## 2022-06-29 DIAGNOSIS — Z0000 Encounter for general adult medical examination without abnormal findings: Secondary | ICD-10-CM | POA: Diagnosis not present

## 2022-06-29 NOTE — Patient Instructions (Addendum)
[name] , Thank you for taking time to come for your Medicare Wellness Visit. I appreciate your ongoing commitment to your health goals. Please review the following plan we discussed and let me know if I can assist you in the future.   These are the goals we discussed:  Goals       Exercise 150 minutes per week (moderate activity)      Both doctors she sees is promoting pool exercise; Try it!!!  [facility];       Lose weight (pt-stated)      patient      To get her dogs certified as service dogs to feel safer;  Darl PikesSusan will call with more information       Patient Stated      05/04/2021, no goals        This is a list of the screening recommended for you and due dates:  Health Maintenance  Topic Date Due   Complete foot exam   Never done   Eye exam for diabetics  06/29/2022*   Yearly kidney health urinalysis for diabetes  06/30/2022*   COVID-19 Vaccine (4 - 2023-24 season) 07/15/2022*   Hepatitis C Screening: USPSTF Recommendation to screen - Ages 18-79 yo.  09/23/2022*   HIV Screening  09/23/2022*   Zoster (Shingles) Vaccine (1 of 2) 09/28/2022*   Mammogram  06/29/2023*   Pap Smear  04/06/2035*   Hemoglobin A1C  07/07/2022   DTaP/Tdap/Td vaccine (2 - Td or Tdap) 08/10/2022   Flu Shot  10/20/2022   Yearly kidney function blood test for diabetes  01/06/2023   Medicare Annual Wellness Visit  06/29/2023   Colon Cancer Screening  12/18/2025   HPV Vaccine  Aged Out  *Topic was postponed. The date shown is not the original due date.    Advanced directives: Advance directive discussed with you today. Even though you declined this today, please call our office should you change your mind, and we can give you the proper paperwork for you to fill out.   Conditions/risks identified: None  Next appointment: Follow up in one year for your annual wellness visit.   Preventive Care 40-64 Years, Female Preventive care refers to lifestyle choices and visits with  your health care provider that can promote health and wellness. What does preventive care include? A yearly physical exam. This is also called an annual well check. Dental exams once or twice a year. Routine eye exams. Ask your health care provider how often you should have your eyes checked. Personal lifestyle choices, including: Daily care of your teeth and gums. Regular physical activity. Eating a healthy diet. Avoiding tobacco and drug use. Limiting alcohol use. Practicing safe sex. Taking low-dose aspirin daily starting at age 63. Taking vitamin and mineral supplements as recommended by your health care provider. What happens during an annual well check? The services and screenings done by your health care provider during your annual well check will depend on your age, overall health, lifestyle risk factors, and family history of disease. Counseling  Your health care provider may ask you questions about your: Alcohol use. Tobacco use. Drug use. Emotional well-being. Home and relationship well-being. Sexual activity. Eating habits. Work and work Astron[employer]vironment. Method of birth control. Menstrual cycle. Pregnancy history. Screening  You may have the following tests or measurements: Height, weight, and BMI. Blood pressure. Lipid and cholesterol levels. These may be checked every 5 years, or more frequently if you are over 50 years  old. Skin check. Lung cancer screening. You may have this screening every year starting at age 57 if you have a 30-pack-year history of smoking and currently smoke or have quit within the past 15 years. Fecal occult blood test (FOBT) of the stool. You may have this test every year starting at age 9. Flexible sigmoidoscopy or colonoscopy. You may have a sigmoidoscopy every 5 years or a colonoscopy every 10 years starting at age 70. Hepatitis C blood test. Hepatitis B blood test. Sexually transmitted disease (STD) testing. Diabetes screening. This is  done by checking your blood sugar (glucose) after you have not eaten for a while (fasting). You may have this done every 1-3 years. Mammogram. This may be done every 1-2 years. Talk to your health care provider about when you should start having regular mammograms. This may depend on whether you have a family history of breast cancer. BRCA-related cancer screening. This may be done if you have a family history of breast, ovarian, tubal, or peritoneal cancers. Pelvic exam and Pap test. This may be done every 3 years starting at age 77. Starting at age 36, this may be done every 5 years if you have a Pap test in combination with an HPV test. Bone density scan. This is done to screen for osteoporosis. You may have this scan if you are at high risk for osteoporosis. Discuss your test results, treatment options, and if necessary, the need for more tests with your health care provider. Vaccines  Your health care provider may recommend certain vaccines, such as: Influenza vaccine. This is recommended every year. Tetanus, diphtheria, and acellular pertussis (Tdap, Td) vaccine. You may need a Td booster every 10 years. Zoster vaccine. You may need this after age 39. Pneumococcal 13-valent conjugate (PCV13) vaccine. You may need this if you have certain conditions and were not previously vaccinated. Pneumococcal polysaccharide (PPSV23) vaccine. You may need one or two doses if you smoke cigarettes or if you have certain conditions. Talk to your health care provider about which screenings and vaccines you need and how often you need them. This information is not intended to replace advice given to you by your health care provider. Make sure you discuss any questions you have with your health care provider. Document Released: 04/03/2015 Document Revised: 11/25/2015 Document Reviewed: 01/06/2015 Elsevier Interactive Patient Education  2017 [employer].    Fall Prevention in the Home Falls can cause  injuries. They can happen to people of all ages. There are many things you can do to make your home safe and to help prevent falls. What can I do on the outside of my home? Regularly fix the edges of walkways and driveways and fix any cracks. Remove anything that might make you trip as you walk through a door, such as a raised step or threshold. Trim any bushes or trees on the path to your home. Use bright outdoor lighting. Clear any walking paths of anything that might make someone trip, such as rocks or tools. Regularly check to see if handrails are loose or broken. Make sure that both sides of any steps have handrails. Any raised decks and porches should have guardrails on the edges. Have any leaves, snow, or ice cleared regularly. Use sand or salt on walking paths during winter. Clean up any spills in your garage right away. This includes oil or grease spills. What can I do in the bathroom? Use night lights. Install grab bars by the toilet and in the tub and  shower. Do not use towel bars as grab bars. Use non-skid mats or decals in the tub or shower. If you need to sit down in the shower, use a plastic, non-slip stool. Keep the floor dry. Clean up any water that spills on the floor as soon as it happens. Remove soap buildup in the tub or shower regularly. Attach bath mats securely with double-sided non-slip rug tape. Do not have throw rugs and other things on the floor that can make you trip. What can I do in the bedroom? Use night lights. Make sure that you have a light by your bed that is easy to reach. Do not use any sheets or blankets that are too big for your bed. They should not hang down onto the floor. Have a firm chair that has side arms. You can use this for support while you get dressed. Do not have throw rugs and other things on the floor that can make you trip. What can I do in the kitchen? Clean up any spills right away. Avoid walking on wet floors. Keep items that you  use a lot in easy-to-reach places. If you need to reach something above you, use a strong step stool that has a grab bar. Keep electrical cords out of the way. Do not use floor polish or wax that makes floors slippery. If you must use wax, use non-skid floor wax. Do not have throw rugs and other things on the floor that can make you trip. What can I do with my stairs? Do not leave any items on the stairs. Make sure that there are handrails on both sides of the stairs and use them. Fix handrails that are broken or loose. Make sure that handrails are as long as the stairways. Check any carpeting to make sure that it is firmly attached to the stairs. Fix any carpet that is loose or worn. Avoid having throw rugs at the top or bottom of the stairs. If you do have throw rugs, attach them to the floor with carpet tape. Make sure that you have a light switch at the top of the stairs and the bottom of the stairs. If you do not have them, ask someone to add them for you. What else can I do to help prevent falls? Wear shoes that: Do not have high heels. Have rubber bottoms. Are comfortable and fit you well. Are closed at the toe. Do not wear sandals. If you use a stepladder: Make sure that it is fully opened. Do not climb a closed stepladder. Make sure that both sides of the stepladder are locked into place. Ask someone to hold it for you, if possible. Clearly mark and make sure that you can see: Any grab bars or handrails. First and last steps. Where the edge of each step is. Use tools that help you move around (mobility aids) if they are needed. These include: Canes. Walkers. Scooters. Crutches. Turn on the lights when you go into a dark area. Replace any light bulbs as soon as they burn out. Set up your furniture so you have a clear path. Avoid moving your furniture around. If any of your floors are uneven, fix them. If there are any pets around you, be aware of where they are. Review your  medicines with your doctor. Some medicines can make you feel dizzy. This can increase your chance of falling. Ask your doctor what other things that you can do to help prevent falls. This information is not intended  to replace advice given to you by your health care provider. Make sure you discuss any questions you have with your health care provider. Document Released: 01/01/2009 Document Revised: 08/13/2015 Document Reviewed: 04/11/2014 Elsevier Interactive Patient Education  2017 [employer].

## 2022-06-29 NOTE — Progress Notes (Signed)
Subjective:   Taylor Ritter is a 62 y.o. female who presents for Medicare Annual (Subsequent) preventive examination.  Review of Systems    Virtual Visit via Telephone Note  I connected with  Taylor Ritter on 06/29/22 at 10:15 AM EDT by telephone and verified that I am speaking with the correct person using two identifiers.  Location: Patient: Home Provider: Office Persons participating in the virtual visit: patient/Nurse Health Advisor   I discussed the limitations, risks, security and privacy concerns of performing an evaluation and management service by telephone and the availability of in person appointments. The patient expressed understanding and agreed to proceed.  Interactive audio and video telecommunications were attempted between this nurse and patient, however failed, due to patient having technical difficulties OR patient did not have access to video capability.  We continued and completed visit with audio only.  Some vital signs may be absent or patient reported.   Tillie Rung, LPN  Cardiac Risk Factors include: advanced age (>11men, >40 women);hypertension;obesity (BMI >30kg/m2)     Objective:    Today's Vitals   06/29/22 1019  Weight: 268 lb (121.6 kg)  Height: 5\' 6"  (1.676 m)   Body mass index is 43.26 kg/m.     06/29/2022   10:28 AM 06/25/2021    8:19 AM 05/04/2021   11:40 AM 12/18/2020    9:30 AM 05/26/2020   11:21 AM 09/10/2019    3:01 PM 10/06/2017   11:48 AM  Advanced Directives  Does Patient Have a Medical Advance Directive? No No No No No No No  Would patient like information on creating a medical advance directive? No - Patient declined No - Patient declined  No - Patient declined No - Patient declined  No - Patient declined    Current Medications (verified) Outpatient Encounter Medications as of 06/29/2022  Medication Sig   albuterol (VENTOLIN HFA) 108 (90 Base) MCG/ACT inhaler Inhale 1-2 puffs into the lungs every 6 (six) hours as needed  for wheezing or shortness of breath.   ALPRAZolam (XANAX) 1 MG tablet Take one tablet by mouth daily as needed for severe anxiety.   benzonatate (TESSALON) 100 MG capsule Take 1 capsule (100 mg total) by mouth 3 (three) times daily as needed for cough.   buPROPion (WELLBUTRIN XL) 150 MG 24 hr tablet Take 3 tabs daily.  Please schedule a physical for more refills. thanks   cyanocobalamin (VITAMIN B12) 1000 MCG/ML injection INJECT 1 ML (1,000 MCG TOTAL) INTO THE MUSCLE EVERY 30 (THIRTY) DAYS.   cyclobenzaprine (FLEXERIL) 5 MG tablet Take 5 mg by mouth 2 (two) times daily as needed for muscle spasms.   diphenhydrAMINE (BENADRYL) 50 MG capsule Take 50 mg by mouth every 6 (six) hours as needed for allergies.   doxycycline (VIBRA-TABS) 100 MG tablet Take 1 tablet (100 mg total) by mouth 2 (two) times daily.   EPINEPHrine 0.3 mg/0.3 mL IJ SOAJ injection Inject 0.3 mg into the muscle as needed for anaphylaxis.   Estradiol 10 MCG TABS vaginal tablet Place 1 tablet (10 mcg total) vaginally 2 (two) times a week. (Patient taking differently: Place 1 tablet vaginally daily as needed (vaginal dryness).)   famotidine (PEPCID) 10 MG tablet Take 20 mg by mouth at bedtime.   fexofenadine (ALLEGRA) 180 MG tablet Take 180 mg by mouth daily.   fluticasone (FLONASE) 50 MCG/ACT nasal spray Place 1 spray into both nostrils daily as needed for allergies.   furosemide (LASIX) 20 MG tablet TAKE ONE  TABLET BY MOUTH DAILY AS NEEDED FOR EDEMA   GABAPENTIN PO Take by mouth. Take 2 tablets at bedtime   hydrochlorothiazide (HYDRODIURIL) 25 MG tablet Take 1 tablet (25 mg total) by mouth daily.   ibuprofen (ADVIL) 200 MG tablet Take 800 mg by mouth every 8 (eight) hours as needed (for pain.).   ipratropium (ATROVENT) 0.03 % nasal spray PLACE 2 SPRAYS INTO BOTH NOSTRILS EVERY 12 (TWELVE) HOURS.   levETIRAcetam (KEPPRA) 500 MG tablet Take 1,000 mg by mouth at bedtime.    linaclotide (LINZESS) 290 MCG CAPS capsule Take 580 mcg by  mouth in the morning.   montelukast (SINGULAIR) 10 MG tablet Take 1 tablet (10 mg total) by mouth at bedtime.   Multiple Vitamin (MULTIVITAMIN WITH MINERALS) TABS tablet Take 1 tablet by mouth daily. One A Day for Women   NEXIUM 40 MG capsule TAKE 1 CAPSULE TWICE DAILY   nystatin (MYCOSTATIN/NYSTOP) powder Apply topically 4 (four) times daily.   nystatin ointment (MYCOSTATIN) APPLY 1 APPLICATION TOPICALLY 2 (TWO) TIMES DAILY AS NEEDED   pilocarpine (SALAGEN) 5 MG tablet Take 5 mg by mouth 2 (two) times daily as needed (dryness).   potassium chloride (KLOR-CON) 10 MEQ tablet Take 2 tablets by mouth once a day.   topiramate (TOPAMAX) 100 MG tablet Take 200 mg by mouth at bedtime.    Vilazodone HCl (VIIBRYD) 40 MG TABS Take 1 tablet (40 mg total) by mouth daily.   vitamin B-12 (CYANOCOBALAMIN) 1000 MCG tablet Take 1,000 mcg by mouth in the morning.   No facility-administered encounter medications on file as of 06/29/2022.    Allergies (verified) Other, Percocet [oxycodone-acetaminophen], Augmentin [amoxicillin-pot clavulanate], Codeine, Dye fdc red [red dye], Morphine and related, and Vesicare [solifenacin succinate]   History: Past Medical History:  Diagnosis Date   Allergy    takes Allegra daily and uses nasal spray daily.Takes Benadryl as needed.Uses eye drops daily   Anemia    B 12 shots monthly   Anxiety    takes Xanax as needed   Arthritis    osteoporosis   Asthma    Proventil inhaler as needed   Chronic back pain    DDD and stenosis   Complication of anesthesia    pt states she was woke up during surgery   Depression    takes Wellbutrin daily   Fibromyalgia    GERD (gastroesophageal reflux disease)    takes Nexium daily   Headache(784.0)    takes Topamax daily as needed   History of bronchitis    2016   History of colon polyps    benign   History of vertigo    took Meclizine several yrs ago   Hypertension    followed by [facility]   IBS  (irritable bowel syndrome)    takes LInzess daily   Insomnia    Joint pain    Muscle spasm    takes Flexeril daily as needed   Neuromuscular disorder    numbness arms legs,feet    Nocturia    Osteoporosis    Pain    takes Keppra and Topamax daily   Peripheral edema    takes HCTZ daily   Sleep apnea    has a CPAP but doesn't wear often   Type 2 diabetes mellitus with hyperglycemia 09/22/2021   Urinary frequency    Urinary incontinence    Urinary urgency    Past Surgical History:  Procedure Laterality Date   ABDOMINAL HYSTERECTOMY  ANTERIOR FUSION CERVICAL SPINE     BACK SURGERY  2012   bladder tacked     CARPAL TUNNEL RELEASE     R&L   CESAREAN SECTION  1983   CHOLECYSTECTOMY     COLONOSCOPY     COLONOSCOPY WITH PROPOFOL N/A 10/06/2017   Procedure: COLONOSCOPY WITH PROPOFOL;  Surgeon: Jeani Hawking, MD;  Location: [facility];  Service: Endoscopy;  Laterality: N/A;   COLONOSCOPY WITH PROPOFOL N/A 12/18/2020   Procedure: COLONOSCOPY WITH PROPOFOL;  Surgeon: Jeani Hawking, MD;  Location: [facility];  Service: Endoscopy;  Laterality: N/A;   ESOPHAGEAL DILATION     ESOPHAGOGASTRODUODENOSCOPY (EGD) WITH PROPOFOL N/A 06/25/2021   Procedure: ESOPHAGOGASTRODUODENOSCOPY (EGD) WITH PROPOFOL;  Surgeon: Jeani Hawking, MD;  Location: [facility];  Service: Endoscopy;  Laterality: N/A;   KNEE ARTHROSCOPY     L KNEE   LUMBAR LAMINECTOMY     LUMBAR LAMINECTOMY/DECOMPRESSION MICRODISCECTOMY  02/25/2011   Procedure: LUMBAR LAMINECTOMY/DECOMPRESSION MICRODISCECTOMY;  Surgeon: Karn Cassis;  Location: [facility];  Service: Neurosurgery;  Laterality: N/A;  Lumbar Four-Five Microdiscectomy   LUMBAR WOUND DEBRIDEMENT  03/18/2011   Procedure: LUMBAR WOUND DEBRIDEMENT;  Surgeon: Karn Cassis;  Location: [facility];  Service: Neurosurgery;  Laterality: N/A;  Exploration of Lumbar wound   POLYPECTOMY  10/06/2017   Procedure: POLYPECTOMY;  Surgeon: Jeani Hawking, MD;  Location: [facility];  Service: Endoscopy;;   POLYPECTOMY  12/18/2020   Procedure: POLYPECTOMY;  Surgeon: Jeani Hawking, MD;  Location: [facility];  Service: Endoscopy;;   SAVORY DILATION N/A 06/25/2021   Procedure: Gaspar Bidding DILATION;  Surgeon: Jeani Hawking, MD;  Location: [facility];  Service: Endoscopy;  Laterality: N/A;   SEPTOPLASTY  10+yrs ago   SPINAL CORD STIMULATOR INSERTION N/A 12/11/2015   Procedure: LUMBAR SPINAL CORD STIMULATOR INSERTION;  Surgeon: Odette Fraction, MD;  Location: [facility];  Service: Neurosurgery;  Laterality: N/A;   Family History  Problem Relation Age of Onset   Arthritis Mother    Hypertension Mother    Kidney disease Mother    COPD Mother    Heart disease Mother 30       CAD   Diabetes Mother    Mental illness Mother    Arthritis Father    Diabetes Father    Heart disease Father    Cancer Father        prostate cancer   Mental illness Father        Alzheimers Dementia   Cancer Maternal Grandmother        brain tumor   Cancer Maternal Grandfather        lung   Diabetes Maternal Grandfather    Early death Brother    Anesthesia problems Neg Hx    Hypotension Neg Hx    Malignant hyperthermia Neg Hx    Pseudochol deficiency Neg Hx    Social History   Socioeconomic History   Marital status: Married    Spouse name: Not on file   Number of children: Not on file   Years of education: Not on file   Highest education level: Not on file  Occupational History   Not on file  Tobacco Use   Smoking status: Never    Passive exposure: Never   Smokeless tobacco: Never  Vaping Use   Vaping Use: Never used  Substance and Sexual Activity   Alcohol use: No   Drug use: No   Sexual activity: Not on file  Other Topics Concern   Not  on file  Social History Narrative   Not on file   Social Determinants of Health   Financial Resource Strain: Low Risk  (06/29/2022)   Overall Financial Resource Strain (CARDIA)    Difficulty of Paying Living Expenses: Not hard at  all  Food Insecurity: No Food Insecurity (06/29/2022)   Hunger Vital Sign    Worried About Running Out of Food in the Last Year: Never true    Ran Out of Food in the Last Year: Never true  Transportation Needs: No Transportation Needs (06/29/2022)   PRAPARE - Administrator, Civil Service (Medical): No    Lack of Transportation (Non-Medical): No  Physical Activity: Inactive (06/29/2022)   Exercise Vital Sign    Days of Exercise per Week: 0 days    Minutes of Exercise per Session: 0 min  Stress: No Stress Concern Present (06/29/2022)   [employer] of Occupational Health - Occupational Stress Questionnaire    Feeling of Stress : Not at all  Social Connections: Socially Integrated (06/29/2022)   Social Connection and Isolation Panel [NHANES]    Frequency of Communication with Friends and Family: More than three times a week    Frequency of Social Gatherings with Friends and Family: More than three times a week    Attends Religious Services: More than 4 times per year    Active Member of [employer] or Organizations: Yes    Attends Engineer, structural: More than 4 times per year    Marital Status: Married    Tobacco Counseling Counseling given: Not Answered   Clinical Intake:  Pre-visit preparation completed: No  Pain : No/denies pain     BMI - recorded: 43.26 Nutritional Status: BMI > 30  Obese Nutritional Risks: None Diabetes: No  How often do you need to have someone help you when you read instructions, pamphlets, or other written materials from your doctor or pharmacy?: 1 - Never  Diabetic? No  Interpreter Needed?: No  Information entered by :: Zaid Tomes LPN   Activities of Daily Living    06/29/2022   10:25 AM  In your present state of health, do you have any difficulty performing the following activities:  Hearing? 0  Vision? 0  Difficulty concentrating or making decisions? 0  Walking or climbing stairs? 0  Dressing or bathing? 0   Doing errands, shopping? 0  Preparing Food and eating ? N  Using the Toilet? N  In the past six months, have you accidently leaked urine? Y  Comment Wears pads. Followed by PCP  Do you have problems with loss of bowel control? N  Managing your Medications? N  Managing your Finances? N  Housekeeping or managing your Housekeeping? N    Patient Care Team: Kristian Covey, MD as PCP - General (Family Medicine)  Indicate any recent Medical Services you may have received from other than [facility] providers in the past year (date may be approximate).     Assessment:   This is a routine wellness examination for Taylor Ritter.  Hearing/Vision screen Hearing Screening - Comments:: Denies hearing difficulties   Vision Screening - Comments:: Wears rx glasses - up to date with routine eye exams with  Dr London Sheer  Dietary issues and exercise activities discussed: Exercise limited by: None identified   Goals Addressed               This Visit's Progress     Lose weight (pt-stated)         Depression  Screen    06/29/2022   10:25 AM 06/24/2022   11:12 AM 01/18/2022    3:23 PM 01/05/2022    9:11 AM 09/22/2021    8:05 AM 05/04/2021   11:41 AM 05/26/2020   11:23 AM  PHQ 2/9 Scores  PHQ - 2 Score 0 0 0 2 2 6 2   PHQ- 9 Score    8 11 11 7     Fall Risk    06/29/2022   10:27 AM 06/24/2022   11:11 AM 01/05/2022    9:11 AM 05/04/2021   11:41 AM 05/26/2020   11:22 AM  Fall Risk   Falls in the past year? 1 0 1 0 1  Number falls in past yr: 0 1 0  0  Injury with Fall? 0 0 0  0  Comment Followed by medical attntion      Risk for fall due to : No Fall Risks No Fall Risks No Fall Risks Medication side effect History of fall(s)  Follow up Falls prevention discussed  Falls evaluation completed Falls evaluation completed;Education provided;Falls prevention discussed Falls evaluation completed;Falls prevention discussed    FALL RISK PREVENTION PERTAINING TO THE HOME:  Any stairs in or around the  home? Yes If so, are there any without handrails? No  Home free of loose throw rugs in walkways, pet beds, electrical cords, etc? Yes  Adequate lighting in your home to reduce risk of falls? Yes   ASSISTIVE DEVICES UTILIZED TO PREVENT FALLS:  Life alert? No  Use of a cane, walker or w/c? No  Grab bars in the bathroom? No Shower chair or bench in shower? No  Elevated toilet seat or a handicapped toilet? No   TIMED UP AND GO:  Was the test performed? No . Audio Visit  Cognitive Function:        06/29/2022   10:28 AM  6CIT Screen  What Year? 0 points  What month? 0 points  What time? 0 points  Count back from 20 0 points  Months in reverse 0 points  Repeat phrase 0 points  Total Score 0 points    Immunizations Immunization History  Administered Date(s) Administered   Influenza Split 03/19/2011   Influenza,inj,Quad PF,6+ Mos 01/02/2013, 11/13/2015, 12/30/2015   Moderna Sars-Covid-2 Vaccination 05/30/2019, 06/29/2019, 03/02/2020   Pneumococcal Polysaccharide-23 08/02/2012   Tdap 08/09/2012    TDAP status: Up to date  Flu Vaccine status: Up to date    Covid-19 vaccine status: Completed vaccines  Qualifies for Shingles Vaccine? Yes   Zostavax completed No   Shingrix Completed?: No.    Education has been provided regarding the importance of this vaccine. Patient has been advised to call insurance company to determine out of pocket expense if they have not yet received this vaccine. Advised may also receive vaccine at local pharmacy or Health Dept. Verbalized acceptance and understanding.  Screening Tests Health Maintenance  Topic Date Due   FOOT EXAM  Never done   OPHTHALMOLOGY EXAM  06/29/2022 (Originally 10/16/1970)   Diabetic kidney evaluation - Urine ACR  06/30/2022 (Originally 10/16/1977)   COVID-19 Vaccine (4 - 2023-24 season) 07/15/2022 (Originally 11/19/2021)   Hepatitis C Screening  09/23/2022 (Originally [birth year]16/1977)   HIV Screening  09/23/2022 (Originally  10/17/1974)   Zoster Vaccines- Shingrix (1 of 2) 09/28/2022 (Originally 10/16/2009)   MAMMOGRAM  06/29/2023 (Originally 06/05/2022)   PAP SMEAR-Modifier  04/06/2035 (Originally 08/03/2014)   HEMOGLOBIN A1C  07/07/2022   DTaP/Tdap/Td (2 - Td or Tdap) 08/10/2022   INFLUENZA VACCINE  10/20/2022   Diabetic kidney evaluation - eGFR measurement  01/06/2023   Medicare Annual Wellness (AWV)  06/29/2023   COLONOSCOPY (Pts 45-47yrs Insurance coverage will need to be confirmed)  12/18/2025   HPV VACCINES  Aged Out    Health Maintenance  Health Maintenance Due  Topic Date Due   FOOT EXAM  Never done    Colorectal cancer screening: Type of screening: Colonoscopy. Completed 12/18/20. Repeat every 5 years  Mammogram status: Ordered Patient deferred. Pt provided with contact info and advised to call to schedule appt.     Lung Cancer Screening: (Low Dose CT Chest recommended if Age 78-80 years, 30 pack-year currently smoking OR have quit w/in 15years.) does not qualify.     Additional Screening:  Hepatitis C Screening: does qualify;  Deferred  Vision Screening: Recommended annual ophthalmology exams for early detection of glaucoma and other disorders of the eye. Is the patient up to date with their annual eye exam?  Yes  Who is the provider or what is the name of the office in which the patient attends annual eye exams? Dr London Sheer If pt is not established with a provider, would they like to be referred to a provider to establish care? No .   Dental Screening: Recommended annual dental exams for proper oral hygiene  Community Resource Referral / Chronic Care Management:  CRR required this visit?  No   CCM required this visit?  No      Plan:     I have personally reviewed and noted the following in the patient's chart:   Medical and social history Use of alcohol, tobacco or illicit drugs  Current medications and supplements including opioid prescriptions. Patient is not currently  taking opioid prescriptions. Functional ability and status Nutritional status Physical activity Advanced directives List of other physicians Hospitalizations, surgeries, and ER visits in previous 12 months Vitals Screenings to include cognitive, depression, and falls Referrals and appointments  In addition, I have reviewed and discussed with patient certain preventive protocols, quality metrics, and best practice recommendations. A written personalized care plan for preventive services as well as general preventive health recommendations were provided to patient.     Tillie Rung, LPN   [id number]   Nurse Notes:  Patient due Diabetic kidney evaluation-Urine ACR

## 2022-07-04 ENCOUNTER — Other Ambulatory Visit: Payer: Self-pay | Admitting: Pulmonary Disease

## 2022-07-04 ENCOUNTER — Encounter: Payer: Self-pay | Admitting: Family Medicine

## 2022-07-04 ENCOUNTER — Other Ambulatory Visit: Payer: Self-pay | Admitting: Family Medicine

## 2022-07-04 DIAGNOSIS — R0982 Postnasal drip: Secondary | ICD-10-CM

## 2022-07-05 ENCOUNTER — Encounter: Payer: Self-pay | Admitting: Family Medicine

## 2022-07-05 ENCOUNTER — Ambulatory Visit: Payer: Medicare PPO | Admitting: Family Medicine

## 2022-07-05 ENCOUNTER — Ambulatory Visit (INDEPENDENT_AMBULATORY_CARE_PROVIDER_SITE_OTHER): Payer: Medicare PPO

## 2022-07-05 ENCOUNTER — Ambulatory Visit (INDEPENDENT_AMBULATORY_CARE_PROVIDER_SITE_OTHER)
Admission: RE | Admit: 2022-07-05 | Discharge: 2022-07-05 | Disposition: A | Discharge status: Home / Self Care | Payer: Medicare PPO | Source: Ambulatory Visit | Attending: Family Medicine | Admitting: Family Medicine

## 2022-07-05 ENCOUNTER — Other Ambulatory Visit: Payer: Self-pay | Admitting: Family

## 2022-07-05 VITALS — BP 130/80 | HR 96 | Temp 98.6°F | Ht 67.0 in | Wt 271.1 lb

## 2022-07-05 DIAGNOSIS — R42 Dizziness and giddiness: Secondary | ICD-10-CM | POA: Diagnosis not present

## 2022-07-05 DIAGNOSIS — R55 Syncope and collapse: Secondary | ICD-10-CM

## 2022-07-05 DIAGNOSIS — R0982 Postnasal drip: Secondary | ICD-10-CM

## 2022-07-05 DIAGNOSIS — R059 Cough, unspecified: Secondary | ICD-10-CM | POA: Diagnosis not present

## 2022-07-05 DIAGNOSIS — R0981 Nasal congestion: Secondary | ICD-10-CM | POA: Diagnosis not present

## 2022-07-05 DIAGNOSIS — R0602 Shortness of breath: Secondary | ICD-10-CM | POA: Diagnosis not present

## 2022-07-05 LAB — POC COVID19 BINAXNOW: SARS Coronavirus 2 Ag: NEGATIVE

## 2022-07-05 LAB — POC INFLUENZA A&B (BINAX/QUICKVUE)
Influenza A, POC: NEGATIVE
Influenza B, POC: NEGATIVE

## 2022-07-05 MED ORDER — METHYLPREDNISOLONE ACETATE 80 MG/ML IJ SUSP
80.0000 mg | Freq: Once | INTRAMUSCULAR | Status: AC
Start: 2022-07-05 — End: 2022-07-05
  Administered 2022-07-05: 80 mg via INTRAMUSCULAR

## 2022-07-05 MED ORDER — LEVETIRACETAM 500 MG PO TABS: 1000.0000 mg | ORAL_TABLET | Freq: Every day | ORAL | 1 refills | Status: AC

## 2022-07-05 MED ORDER — ALBUTEROL SULFATE HFA 108 (90 BASE) MCG/ACT IN AERS
1.0000 | INHALATION_SPRAY | Freq: Four times a day (QID) | RESPIRATORY_TRACT | 4 refills | Status: AC | PRN
Start: 2022-07-05 — End: ?

## 2022-07-05 MED ORDER — TOPIRAMATE 100 MG PO TABS: 200.0000 mg | ORAL_TABLET | Freq: Every day | ORAL | 1 refills | Status: AC

## 2022-07-05 MED ORDER — ALPRAZOLAM 1 MG PO TABS: ORAL_TABLET | ORAL | 0 refills | Status: DC

## 2022-07-05 MED ORDER — VALACYCLOVIR HCL 500 MG PO TABS: ORAL_TABLET | ORAL | 0 refills | Status: DC

## 2022-07-05 MED ORDER — FLUTICASONE PROPIONATE 50 MCG/ACT NA SUSP
1.0000 | Freq: Every day | NASAL | 0 refills | Status: DC | PRN
Start: 2022-07-05 — End: 2023-05-26

## 2022-07-05 MED ORDER — ALBUTEROL SULFATE HFA 108 (90 BASE) MCG/ACT IN AERS: 1.0000 | INHALATION_SPRAY | Freq: Four times a day (QID) | RESPIRATORY_TRACT | 4 refills | Status: DC | PRN

## 2022-07-05 NOTE — Progress Notes (Signed)
Acute Office Visit   Subjective:  Patient ID: Taylor Ritter, female    DOB: 07-14-1960, 62 y.o.   MRN: [id number]  Chief Complaint  Patient presents with   Sinus Problem    Pt is here today with C/O of nasal drainage, cough. Pt reports she has been tested for COVID and was negative. Pt reports she past out 06/28/2022    Sinus Problem   Patient is a 62 year old caucasian female that presents with continued nasal drainage, nonproductive/productive cough, watery eyes, and bilateral ear popping and tinnitus. Sputum is clear and thick. She was seen on 04/05 by Dr. Herma Carson. She was diagnosed with acute maxillary sinusitis. Prescribed Doxycycline for 10 days and has one dose left. Also, prescribed Benzonatate, but reports it has not been effective. She reports she has also been taking Mucinex-DM.  Denies chest pain, only has SHOB when walking outside or wearing mask, fever, and sore throat.  Patient reports Friday morning when getting ready she had a pre-syncopal episode. She reports all of sudden she started to go down. She reports her husband caught her. She remembers going down. She didn't go to the emergency department as advised by Dr. Neva Seat previously. She reports she went to bed and not had another episode.   ROS See HPI above      Objective:   BP 130/80   Pulse 96   Temp 98.6 F (37 C)   Ht  (1.702 m)   Wt 271 lb 2 oz (123 kg)   SpO2 96%   BMI 42.46 kg/m    Physical Exam Vitals reviewed.  Constitutional:      General: She is not in acute distress.    Appearance: Normal appearance. She is obese. She is not ill-appearing, toxic-appearing or diaphoretic.  HENT:     Head: Normocephalic and atraumatic.     Right Ear: Tympanic membrane, ear canal and external ear normal.     Left Ear: Tympanic membrane, ear canal and external ear normal.     Nose:     Right Sinus: Maxillary sinus tenderness present. No frontal sinus tenderness.     Left Sinus: Maxillary  sinus tenderness present. No frontal sinus tenderness.     Mouth/Throat:     Mouth: Mucous membranes are moist.     Pharynx: Oropharynx is clear. Uvula midline. No pharyngeal swelling, oropharyngeal exudate, posterior oropharyngeal erythema or uvula swelling.  Eyes:     General:        Right eye: No discharge.        Left eye: No discharge.     Conjunctiva/sclera: Conjunctivae normal.  Cardiovascular:     Rate and Rhythm: Normal rate and regular rhythm.     Heart sounds: Normal heart sounds. No murmur heard.    No friction rub. No gallop.  Pulmonary:     Effort: Pulmonary effort is normal. No respiratory distress.     Breath sounds: Normal breath sounds.  Musculoskeletal:        General: Normal range of motion.  Skin:    General: Skin is warm and dry.  Neurological:     General: No focal deficit present.     Mental Status: She is alert and oriented to person, place, and time. Mental status is at baseline.     Motor: No weakness.     Gait: Gait normal.  Psychiatric:        Mood and Affect: Mood normal.  Behavior: Behavior normal.        Thought Content: Thought content normal.        Judgment: Judgment normal.    Results for orders placed or performed in visit on 07/05/22  POC Influenza A&B (Binax test)  Result Value Ref Range   Influenza A, POC Negative Negative   Influenza B, POC Negative Negative  POC COVID-19  Result Value Ref Range   SARS Coronavirus 2 Ag Negative Negative      Assessment & Plan:  Cough, unspecified type -     POC Influenza A&B(BINAX/QUICKVUE) -     POC COVID-19 BinaxNow -     DG Chest 2 View; Future -     Albuterol Sulfate HFA; Inhale 1-2 puffs into the lungs every 6 (six) hours as needed for wheezing or shortness of breath.  Dispense: 6.7 each; Refill: 4  Sinus congestion -     POC Influenza A&B(BINAX/QUICKVUE) -     POC COVID-19 BinaxNow  Post-nasal drainage -     Fluticasone Propionate; Place 1 spray into both nostrils daily as  needed for allergies.  Dispense: 16 mL; Refill: 0  Postural dizziness with presyncope  1.Negative for covid and influenza in office.  2.Ordered chest x-ray for cough and continued intermittent shortness of breath when walking outside or wearing a mask. Recommend to go to the [facility] location for x-ray. Would like to get results of chest x-ray before further treatment. Discussed possibly doing a steroid injection, she prefers injection over oral steroids.  3.Refilled Flonase and Albuterol inhaler. Recommend to use Flonase daily for allergies, post nasal drip, and Albuterol Inhaler as needed for cough. Allergies could be a factor in her symptoms.  4. Suggest that she may want to change allergy medication to see if this helps since she has been on Singulair and Allegra for a while. Recommend Zyrtec and Claritin.  5. Recommend If she has a syncopal episode or feels like a presyncopal episode coming, needs to be evaluated at the emergency department.  6. Stay hydrated. Recommend to drink 64-100oz of fluid a day.   No follow-ups on file.  Zandra Abts, NP

## 2022-07-05 NOTE — Patient Instructions (Addendum)
It was nice to meet you today.  -Negative for covid and influenza in office.  -Ordered chest x-ray for cough and continued intermittent shortness of breath when walking outside or wearing a mask. Please go do  [facility] or xray: Walk in 8:30-4:30 during weekdays, no appointment needed [address]  [place], [place] [address]  -Would like to see x-ray results before further treatment. May need steroids, but will look at x-ray first.  -Refilled Flonase and Albuterol inhaler. Recommend to use Flonase daily for allergies and Albuterol Inhaler as needed for cough.  -May want to change allergy medication to see if this helps since you have been on Singulair and Allegra for  a while. Recommend Zyrtec and Claritin.  -If you have a syncopal episode or feel a presyncopal episode coming, needs to be evaluated at the emergency department.  -Stay hydrated. Recommend to drink 64-100oz of fluid a day.

## 2022-07-05 NOTE — Progress Notes (Signed)
Pt came in for a steroid injection approved by Zandra Abts ,NP. Gave 80 mg of depo medrol  in right upper outer quadrant . Pt tolerated injection well .

## 2022-07-05 NOTE — Telephone Encounter (Signed)
Rx sent 

## 2022-07-06 MED ORDER — IPRATROPIUM BROMIDE 0.03 % NA SOLN
2.0000 | Freq: Two times a day (BID) | NASAL | 0 refills | Status: AC
Start: 2022-07-06 — End: ?

## 2022-07-08 ENCOUNTER — Ambulatory Visit: Payer: Medicare PPO | Admitting: Family Medicine

## 2022-07-20 ENCOUNTER — Other Ambulatory Visit: Payer: Self-pay | Admitting: Pulmonary Disease

## 2022-07-20 DIAGNOSIS — R0982 Postnasal drip: Secondary | ICD-10-CM

## 2022-07-28 ENCOUNTER — Other Ambulatory Visit: Payer: Self-pay | Admitting: Family

## 2022-08-02 ENCOUNTER — Other Ambulatory Visit: Payer: Self-pay | Admitting: Family Medicine

## 2022-08-02 DIAGNOSIS — I10 Essential (primary) hypertension: Secondary | ICD-10-CM

## 2022-08-02 MED ORDER — VALACYCLOVIR HCL 500 MG PO TABS: ORAL_TABLET | ORAL | 0 refills | Status: DC

## 2022-08-02 NOTE — Addendum Note (Signed)
Addended by: Christy Sartorius on: 08/02/2022 09:57 AM   Modules accepted: Orders

## 2022-08-10 DIAGNOSIS — R519 Headache, unspecified: Secondary | ICD-10-CM | POA: Diagnosis not present

## 2022-08-10 DIAGNOSIS — M961 Postlaminectomy syndrome, not elsewhere classified: Secondary | ICD-10-CM | POA: Diagnosis not present

## 2022-08-10 DIAGNOSIS — Z9689 Presence of other specified functional implants: Secondary | ICD-10-CM | POA: Diagnosis not present

## 2022-08-10 DIAGNOSIS — Z6841 Body Mass Index (BMI) 40.0 and over, adult: Secondary | ICD-10-CM | POA: Diagnosis not present

## 2022-09-01 DIAGNOSIS — M47816 Spondylosis without myelopathy or radiculopathy, lumbar region: Secondary | ICD-10-CM | POA: Diagnosis not present

## 2022-09-01 DIAGNOSIS — M9901 Segmental and somatic dysfunction of cervical region: Secondary | ICD-10-CM | POA: Diagnosis not present

## 2022-09-01 DIAGNOSIS — M4802 Spinal stenosis, cervical region: Secondary | ICD-10-CM | POA: Diagnosis not present

## 2022-09-01 DIAGNOSIS — M9903 Segmental and somatic dysfunction of lumbar region: Secondary | ICD-10-CM | POA: Diagnosis not present

## 2022-09-01 DIAGNOSIS — M9902 Segmental and somatic dysfunction of thoracic region: Secondary | ICD-10-CM | POA: Diagnosis not present

## 2022-09-01 DIAGNOSIS — M47812 Spondylosis without myelopathy or radiculopathy, cervical region: Secondary | ICD-10-CM | POA: Diagnosis not present

## 2022-09-01 DIAGNOSIS — M546 Pain in thoracic spine: Secondary | ICD-10-CM | POA: Diagnosis not present

## 2022-09-02 ENCOUNTER — Ambulatory Visit: Payer: Medicare PPO | Admitting: Family Medicine

## 2022-09-02 ENCOUNTER — Telehealth: Payer: Self-pay

## 2022-09-02 ENCOUNTER — Encounter: Payer: Self-pay | Admitting: Family Medicine

## 2022-09-02 VITALS — BP 112/64 | HR 79 | Temp 98.2°F | Ht 67.0 in | Wt 268.1 lb

## 2022-09-02 DIAGNOSIS — E1165 Type 2 diabetes mellitus with hyperglycemia: Secondary | ICD-10-CM | POA: Diagnosis not present

## 2022-09-02 DIAGNOSIS — E538 Deficiency of other specified B group vitamins: Secondary | ICD-10-CM | POA: Diagnosis not present

## 2022-09-02 DIAGNOSIS — I10 Essential (primary) hypertension: Secondary | ICD-10-CM | POA: Diagnosis not present

## 2022-09-02 LAB — MICROALBUMIN / CREATININE URINE RATIO
Creatinine,U: 42.4 mg/dL
Microalb Creat Ratio: 1.7 mg/g (ref 0.0–30.0)
Microalb, Ur: <0.7 mg/dL (ref 0.0–1.9)

## 2022-09-02 LAB — COMPREHENSIVE METABOLIC PANEL
ALT: 19 U/L (ref 0–35)
AST: 29 U/L (ref 0–37)
Albumin: 4.3 g/dL (ref 3.5–5.2)
Alkaline Phosphatase: 87 U/L (ref 39–117)
BUN: 18 mg/dL (ref 6–23)
CO2: 25 mEq/L (ref 19–32)
Calcium: 9.3 mg/dL (ref 8.4–10.5)
Chloride: 99 mEq/L (ref 96–112)
Creatinine, Ser: 1.18 mg/dL (ref 0.40–1.20)
GFR: 49.37 mL/min — ABNORMAL LOW (ref >60.00)
Glucose, Bld: 106 mg/dL — ABNORMAL HIGH (ref 70–99)
Potassium: 3.9 mEq/L (ref 3.5–5.1)
Sodium: 136 mEq/L (ref 135–145)
Total Bilirubin: 0.5 mg/dL (ref 0.2–1.2)
Total Protein: 8 g/dL (ref 6.0–8.3)

## 2022-09-02 LAB — LIPID PANEL
Cholesterol: 182 mg/dL (ref 0–200)
HDL: 53.5 mg/dL (ref >39.00)
LDL Cholesterol: 100 mg/dL — ABNORMAL HIGH (ref 0–99)
NonHDL: 128.03
Total CHOL/HDL Ratio: 3
Triglycerides: 141 mg/dL (ref 0.0–149.0)
VLDL: 28.2 mg/dL (ref 0.0–40.0)

## 2022-09-02 LAB — HEMOGLOBIN A1C: Hgb A1c MFr Bld: 5.8 % (ref 4.6–6.5)

## 2022-09-02 LAB — VITAMIN B12: Vitamin B-12: 522 pg/mL (ref 211–911)

## 2022-09-02 MED ORDER — ESTRADIOL 10 MCG VA TABS: 1.0000 | ORAL_TABLET | VAGINAL | 3 refills | Status: AC

## 2022-09-02 MED ORDER — FUROSEMIDE 20 MG PO TABS: ORAL_TABLET | ORAL | 1 refills | Status: DC

## 2022-09-02 NOTE — Telephone Encounter (Signed)
Already addressed with nurse.   In absence of fever, rash,etc, do not recommend Lyme serologies given lack of sensitivity and specificity.    Kristian Covey MD [facility] at [facility]

## 2022-09-02 NOTE — Telephone Encounter (Signed)
Pt had told lab that she need Lyme Disease blood check and " She forgot to mention this to provider and she has been getting covered in ticks and having to "dig them out of her skin."'  Brought it up to provider. He Stated it is not recommended unless have rash, fever, new joint pain. Due to false positive.  Relay message to pt.   Pt states she has itching, redness on where ticks bitten her. On right hip, back and stomach. It happened within the last 2 wks.   Pt states she got one on the back last year that was "totally embedded".  Denied fever. She states she is constantly has join pain. Unable to tell.   Please advise.

## 2022-09-02 NOTE — Progress Notes (Signed)
Established Patient Office Visit  Subjective   Patient ID: Taylor Ritter, female    DOB: 1960/08/20  Age: 62 y.o. MRN: [id number]  Chief Complaint  Patient presents with   Medical Management of Chronic Issues    Follow up on DM    HPI   Taylor Ritter is seen for chronic medical follow-up.  She has history of hypertension, GERD, type 2 diabetes, osteoarthritis involving multiple joints, recurrent depression.  She is currently between GYN providers and is requesting refill of topical estradiol until she can reestablish.  Her blood pressures been well-controlled.  She has diabetes and has managed this without medication thus far.  She has managed to lose about 11 pounds since last visit and had lost even more but recently gained some back.  Her father passed away from complications of infected gallbladder with presumed sepsis recently.  She is coping fairly well.  She needs follow-up labs today.  Past Medical History:  Diagnosis Date   Allergy    takes Allegra daily and uses nasal spray daily.Takes Benadryl as needed.Uses eye drops daily   Anemia    B 12 shots monthly   Anxiety    takes Xanax as needed   Arthritis    osteoporosis   Asthma    Proventil inhaler as needed   Chronic back pain    DDD and stenosis   Complication of anesthesia    pt states she was woke up during surgery   Depression    takes Wellbutrin daily   Fibromyalgia    GERD (gastroesophageal reflux disease)    takes Nexium daily   Headache(784.0)    takes Topamax daily as needed   History of bronchitis    2016   History of colon polyps    benign   History of vertigo    took Meclizine several yrs ago   Hypertension    followed by [place] [facility]   IBS (irritable bowel syndrome)    takes LInzess daily   Insomnia    Joint pain    Muscle spasm    takes Flexeril daily as needed   Neuromuscular disorder (HCC)    numbness arms legs,feet    Nocturia    Osteoporosis    Pain    takes  Keppra and Topamax daily   Peripheral edema    takes HCTZ daily   Sleep apnea    has a CPAP but doesn't wear often   Type 2 diabetes mellitus with hyperglycemia (HCC) 09/22/2021   Urinary frequency    Urinary incontinence    Urinary urgency    Past Surgical History:  Procedure Laterality Date   ABDOMINAL HYSTERECTOMY     ANTERIOR FUSION CERVICAL SPINE     BACK SURGERY  2012   bladder tacked     CARPAL TUNNEL RELEASE     R&L   CESAREAN SECTION  1983   CHOLECYSTECTOMY     COLONOSCOPY     COLONOSCOPY WITH PROPOFOL N/A 10/06/2017   Procedure: COLONOSCOPY WITH PROPOFOL;  Surgeon: Jeani Hawking, MD;  Location: [facility];  Service: Endoscopy;  Laterality: N/A;   COLONOSCOPY WITH PROPOFOL N/A 12/18/2020   Procedure: COLONOSCOPY WITH PROPOFOL;  Surgeon: Jeani Hawking, MD;  Location: [facility];  Service: Endoscopy;  Laterality: N/A;   ESOPHAGEAL DILATION     ESOPHAGOGASTRODUODENOSCOPY (EGD) WITH PROPOFOL N/A 06/25/2021   Procedure: ESOPHAGOGASTRODUODENOSCOPY (EGD) WITH PROPOFOL;  Surgeon: Jeani Hawking, MD;  Location: [facility];  Service: Endoscopy;  Laterality: N/A;  KNEE ARTHROSCOPY     L KNEE   LUMBAR LAMINECTOMY     LUMBAR LAMINECTOMY/DECOMPRESSION MICRODISCECTOMY  02/25/2011   Procedure: LUMBAR LAMINECTOMY/DECOMPRESSION MICRODISCECTOMY;  Surgeon: Karn Cassis;  [facility]: [facility];  Service: Neurosurgery;  Laterality: N/A;  Lumbar Four-Five Microdiscectomy   LUMBAR WOUND DEBRIDEMENT  03/18/2011   Procedure: LUMBAR WOUND DEBRIDEMENT;  Surgeon: Karn Cassis;  [facility]: [facility];  Service: Neurosurgery;  Laterality: N/A;  Exploration of Lumbar wound   POLYPECTOMY  10/06/2017   Procedure: POLYPECTOMY;  Surgeon: Jeani Hawking, MD;  [facility]: [facility];  Service: Endoscopy;;   POLYPECTOMY  12/18/2020   Procedure: POLYPECTOMY;  Surgeon: Jeani Hawking, MD;  [facility]: [facility];  Service: Endoscopy;;   SAVORY DILATION N/A 06/25/2021   Procedure: Gaspar Bidding DILATION;   Surgeon: Jeani Hawking, MD;  [facility]: [facility];  Service: Endoscopy;  Laterality: N/A;   SEPTOPLASTY  10+yrs ago   SPINAL CORD STIMULATOR INSERTION N/A 12/11/2015   Procedure: LUMBAR SPINAL CORD STIMULATOR INSERTION;  Surgeon: Odette Fraction, MD;  Location: [facility];  Service: Neurosurgery;  Laterality: N/A;    reports that she has never smoked. She has never been exposed to tobacco smoke. She has never used smokeless tobacco. She reports that she does not drink alcohol and does not use drugs. family history includes Arthritis in her father and mother; COPD in her mother; Cancer in her father, maternal grandfather, and maternal grandmother; Diabetes in her father, maternal grandfather, and mother; Early death in her brother; Heart disease in her father; Heart disease (age of onset: 16) in her mother; Hypertension in her mother; Kidney disease in her mother; Mental illness in her father and mother. Allergies  Allergen Reactions   Other Other (See Comments)    Nylon sutures had to remove to allow healing  Vicryl sutures - do not use   Percocet [Oxycodone-Acetaminophen] Other (See Comments)    "makes her crazy"   Augmentin [Amoxicillin-Pot Clavulanate] Other (See Comments)    Unknown reaction   Codeine Hives   Dye Fdc Red [Red Dye] Hives and Other (See Comments)    Scan dye, broke out in hives all over once with a neck scan   Morphine And Codeine Hives and Itching   Vesicare [Solifenacin Succinate] Itching    Review of Systems  Constitutional:  Negative for malaise/fatigue.  Eyes:  Negative for blurred vision.  Respiratory:  Negative for shortness of breath.   Cardiovascular:  Negative for chest pain.  Gastrointestinal:  Negative for abdominal pain.  Neurological:  Negative for dizziness, weakness and headaches.      Objective:     BP 112/64   Pulse 79   Temp 98.2 F (36.8 C) (Oral)   Ht 5\' 7"  (1.702 m)   Wt 268 lb 1.6 oz (121.6 kg)   SpO2 97%   BMI 41.99 kg/m  BP  Readings from Last 3 Encounters:  09/02/22 112/64  07/05/22 130/80  06/24/22 120/76   Wt Readings from Last 3 Encounters:  09/02/22 268 lb 1.6 oz (121.6 kg)  07/05/22 271 lb 2 oz (123 kg)  06/29/22 268 lb (121.6 kg)      Physical Exam Constitutional:      Appearance: She is well-developed.  Eyes:     Pupils: Pupils are equal, round, and reactive to light.  Neck:     Thyroid: No thyromegaly.     Vascular: No JVD.  Cardiovascular:     Rate and Rhythm: Normal rate and regular rhythm.  Heart sounds:     No gallop.  Pulmonary:     Effort: Pulmonary effort is normal. No respiratory distress.     Breath sounds: Normal breath sounds. No wheezing or rales.  Musculoskeletal:     Cervical back: Neck supple.     Right lower leg: No edema.     Left lower leg: No edema.  Neurological:     Mental Status: She is alert.      No results found for any visits on 09/02/22.    The 10-year ASCVD risk score (Arnett DK, et al., 2019) is: 7.1%    Assessment & Plan:   Problem List Items Addressed This Visit       Unprioritized   Type 2 diabetes mellitus with hyperglycemia (HCC) - Primary   Relevant Orders   Microalbumin/Creatinine Ratio, Urine   Lipid panel   Hemoglobin A1c   Vitamin B 12 deficiency   Relevant Orders   Vitamin B12   Hypertension   Relevant Medications   furosemide (LASIX) 20 MG tablet   Other Relevant Orders   CMP  -Blood pressure well-controlled.  Continue HCTZ.  Continue weight loss efforts.  Continue low-sodium diet.  Check comprehensive metabolic panel  -History of B12 deficiency.  She is on replacement.  Recheck B12 level  -Continue yearly diabetic eye exam.  Check urine microalbumin and recheck A1c today.  Return in about 6 months (around 03/04/2023).    Evelena Peat, MD

## 2022-09-05 NOTE — Telephone Encounter (Signed)
Attempted to reach pt. Left a voicemail to contact us back.  

## 2022-09-07 NOTE — Telephone Encounter (Signed)
Spoke to pt and inform her of Dr. Lucie Leather message. Verbalized understanding.

## 2022-09-13 DIAGNOSIS — M9903 Segmental and somatic dysfunction of lumbar region: Secondary | ICD-10-CM | POA: Diagnosis not present

## 2022-09-13 DIAGNOSIS — M47816 Spondylosis without myelopathy or radiculopathy, lumbar region: Secondary | ICD-10-CM | POA: Diagnosis not present

## 2022-09-13 DIAGNOSIS — M9901 Segmental and somatic dysfunction of cervical region: Secondary | ICD-10-CM | POA: Diagnosis not present

## 2022-09-13 DIAGNOSIS — M47812 Spondylosis without myelopathy or radiculopathy, cervical region: Secondary | ICD-10-CM | POA: Diagnosis not present

## 2022-09-13 DIAGNOSIS — M4802 Spinal stenosis, cervical region: Secondary | ICD-10-CM | POA: Diagnosis not present

## 2022-09-13 DIAGNOSIS — M9902 Segmental and somatic dysfunction of thoracic region: Secondary | ICD-10-CM | POA: Diagnosis not present

## 2022-09-13 DIAGNOSIS — M546 Pain in thoracic spine: Secondary | ICD-10-CM | POA: Diagnosis not present

## 2022-09-27 ENCOUNTER — Other Ambulatory Visit: Payer: Self-pay | Admitting: Family

## 2022-09-27 DIAGNOSIS — M47816 Spondylosis without myelopathy or radiculopathy, lumbar region: Secondary | ICD-10-CM | POA: Diagnosis not present

## 2022-09-27 DIAGNOSIS — M9903 Segmental and somatic dysfunction of lumbar region: Secondary | ICD-10-CM | POA: Diagnosis not present

## 2022-09-27 DIAGNOSIS — M9901 Segmental and somatic dysfunction of cervical region: Secondary | ICD-10-CM | POA: Diagnosis not present

## 2022-09-27 DIAGNOSIS — M546 Pain in thoracic spine: Secondary | ICD-10-CM | POA: Diagnosis not present

## 2022-09-27 DIAGNOSIS — M4802 Spinal stenosis, cervical region: Secondary | ICD-10-CM | POA: Diagnosis not present

## 2022-09-27 DIAGNOSIS — M9902 Segmental and somatic dysfunction of thoracic region: Secondary | ICD-10-CM | POA: Diagnosis not present

## 2022-09-27 DIAGNOSIS — M47812 Spondylosis without myelopathy or radiculopathy, cervical region: Secondary | ICD-10-CM | POA: Diagnosis not present

## 2022-09-28 ENCOUNTER — Other Ambulatory Visit: Payer: Medicare PPO

## 2022-09-28 ENCOUNTER — Encounter: Payer: Self-pay | Admitting: Family Medicine

## 2022-09-28 ENCOUNTER — Ambulatory Visit: Payer: Medicare PPO | Admitting: Family Medicine

## 2022-09-28 VITALS — BP 122/72 | HR 107 | Temp 97.9°F | Ht 67.0 in | Wt 261.4 lb

## 2022-09-28 DIAGNOSIS — E1165 Type 2 diabetes mellitus with hyperglycemia: Secondary | ICD-10-CM | POA: Diagnosis not present

## 2022-09-28 DIAGNOSIS — R300 Dysuria: Secondary | ICD-10-CM | POA: Diagnosis not present

## 2022-09-28 DIAGNOSIS — M545 Low back pain, unspecified: Secondary | ICD-10-CM

## 2022-09-28 LAB — URINALYSIS, ROUTINE W REFLEX MICROSCOPIC
Bilirubin Urine: NEGATIVE
Hgb urine dipstick: NEGATIVE
Ketones, ur: NEGATIVE
Leukocytes,Ua: NEGATIVE
Nitrite: NEGATIVE
Specific Gravity, Urine: 1.015 (ref 1.000–1.030)
Total Protein, Urine: NEGATIVE
Urine Glucose: NEGATIVE
Urobilinogen, UA: 0.2 (ref 0.0–1.0)
pH: 6 (ref 5.0–8.0)

## 2022-09-28 LAB — POC URINALSYSI DIPSTICK (AUTOMATED)
Bilirubin, UA: NEGATIVE
Blood, UA: POSITIVE
Glucose, UA: NEGATIVE
Ketones, UA: NEGATIVE
Leukocytes, UA: NEGATIVE
Nitrite, UA: NEGATIVE
Protein, UA: NEGATIVE
Spec Grav, UA: 1.015 (ref 1.010–1.025)
Urobilinogen, UA: 0.2 E.U./dL
pH, UA: 5.5 (ref 5.0–8.0)

## 2022-09-28 MED ORDER — MELOXICAM 15 MG PO TABS
15.0000 mg | ORAL_TABLET | Freq: Every day | ORAL | 0 refills | Status: DC
Start: 2022-09-28 — End: 2022-10-26

## 2022-09-28 NOTE — Progress Notes (Signed)
Established Patient Office Visit  Subjective   Patient ID: Taylor Ritter, female    DOB: Mar 03, 1961  Age: 62 y.o. MRN: [id number]  Chief Complaint  Patient presents with   Back Pain    HPI   Taylor Ritter is seen with right lumbar back pain about 3 weeks duration.  She states there bathroom was out of commission recently and she had to squat behind a shed to use the bathroom and that is when she first noted the pain when she was getting up.  She had multiple back surgeries including multilevel fusion in the past.  Denies any sciatica symptoms.  Occasional right lower quadrant abdominal pain.  Pain worse with movement.  No burning with urination.  She has been to chiropractor a few times without any relief.  Tried some leftover Flexeril without much change.  She and her husband have done tremendous job with recent positive lifestyle changes with walking and especially dietary changes.  She is lost over 30 pounds intentionally since last year at this time and feels better overall.  Recent A1c 5.8%.  Past Medical History:  Diagnosis Date   Allergy    takes Allegra daily and uses nasal spray daily.Takes Benadryl as needed.Uses eye drops daily   Anemia    B 12 shots monthly   Anxiety    takes Xanax as needed   Arthritis    osteoporosis   Asthma    Proventil inhaler as needed   Chronic back pain    DDD and stenosis   Complication of anesthesia    pt states she was woke up during surgery   Depression    takes Wellbutrin daily   Fibromyalgia    GERD (gastroesophageal reflux disease)    takes Nexium daily   Headache(784.0)    takes Topamax daily as needed   History of bronchitis    2016   History of colon polyps    benign   History of vertigo    took Meclizine several yrs ago   Hypertension    followed by [place] [facility]   IBS (irritable bowel syndrome)    takes LInzess daily   Insomnia    Joint pain    Muscle spasm    takes Flexeril daily as needed    Neuromuscular disorder (HCC)    numbness arms legs,feet    Nocturia    Osteoporosis    Pain    takes Keppra and Topamax daily   Peripheral edema    takes HCTZ daily   Sleep apnea    has a CPAP but doesn't wear often   Type 2 diabetes mellitus with hyperglycemia (HCC) 09/22/2021   Urinary frequency    Urinary incontinence    Urinary urgency    Past Surgical History:  Procedure Laterality Date   ABDOMINAL HYSTERECTOMY     ANTERIOR FUSION CERVICAL SPINE     BACK SURGERY  2012   bladder tacked     CARPAL TUNNEL RELEASE     R&L   CESAREAN SECTION  1983   CHOLECYSTECTOMY     COLONOSCOPY     COLONOSCOPY WITH PROPOFOL N/A 10/06/2017   Procedure: COLONOSCOPY WITH PROPOFOL;  Surgeon: Jeani Hawking, MD;  [facility];  Service: Endoscopy;  Laterality: N/A;   COLONOSCOPY WITH PROPOFOL N/A 12/18/2020   Procedure: COLONOSCOPY WITH PROPOFOL;  Surgeon: Jeani Hawking, MD;  [facility];  Service: Endoscopy;  Laterality: N/A;   ESOPHAGEAL DILATION     ESOPHAGOGASTRODUODENOSCOPY (EGD) WITH  PROPOFOL N/A 06/25/2021   Procedure: ESOPHAGOGASTRODUODENOSCOPY (EGD) WITH PROPOFOL;  Surgeon: Jeani Hawking, MD;  [facility];  Service: Endoscopy;  Laterality: N/A;   KNEE ARTHROSCOPY     L KNEE   LUMBAR LAMINECTOMY     LUMBAR LAMINECTOMY/DECOMPRESSION MICRODISCECTOMY  02/25/2011   Procedure: LUMBAR LAMINECTOMY/DECOMPRESSION MICRODISCECTOMY;  Surgeon: Karn Cassis;  [facility]: [facility];  Service: Neurosurgery;  Laterality: N/A;  Lumbar Four-Five Microdiscectomy   LUMBAR WOUND DEBRIDEMENT  03/18/2011   Procedure: LUMBAR WOUND DEBRIDEMENT;  Surgeon: Karn Cassis;  [facility]: [facility];  Service: Neurosurgery;  Laterality: N/A;  Exploration of Lumbar wound   POLYPECTOMY  10/06/2017   Procedure: POLYPECTOMY;  Surgeon: Jeani Hawking, MD;  [facility]: [facility];  Service: Endoscopy;;   POLYPECTOMY  12/18/2020   Procedure: POLYPECTOMY;  Surgeon: Jeani Hawking, MD;  Location:  [facility];  Service: Endoscopy;;   SAVORY DILATION N/A 06/25/2021   Procedure: Gaspar Bidding DILATION;  Surgeon: Jeani Hawking, MD;  [facility];  Service: Endoscopy;  Laterality: N/A;   SEPTOPLASTY  10+yrs ago   SPINAL CORD STIMULATOR INSERTION N/A 12/11/2015   Procedure: LUMBAR SPINAL CORD STIMULATOR INSERTION;  Surgeon: Odette Fraction, MD;  [facility]: [facility];  Service: Neurosurgery;  Laterality: N/A;    reports that she has never smoked. She has never been exposed to tobacco smoke. She has never used smokeless tobacco. She reports that she does not drink alcohol and does not use drugs. family history includes Arthritis in her father and mother; COPD in her mother; Cancer in her father, maternal grandfather, and maternal grandmother; Diabetes in her father, maternal grandfather, and mother; Early death in her brother; Heart disease in her father; Heart disease (age of onset: 32) in her mother; Hypertension in her mother; Kidney disease in her mother; Mental illness in her father and mother. Allergies  Allergen Reactions   Other Other (See Comments)    Nylon sutures had to remove to allow healing  Vicryl sutures - do not use   Percocet [Oxycodone-Acetaminophen] Other (See Comments)    "makes her crazy"   Augmentin [Amoxicillin-Pot Clavulanate] Other (See Comments)    Unknown reaction   Codeine Hives   Dye Fdc Red [Red Dye] Hives and Other (See Comments)    Scan dye, broke out in hives all over once with a neck scan   Morphine And Codeine Hives and Itching   Vesicare [Solifenacin Succinate] Itching    Review of Systems  Constitutional:  Negative for chills and fever.  Gastrointestinal:  Negative for blood in stool, constipation, diarrhea, nausea and vomiting.  Genitourinary:  Negative for dysuria.  Musculoskeletal:  Positive for back pain.      Objective:     BP 122/72 (BP Location: Left Arm, Patient Position: Sitting, Cuff Size: Large)   Pulse (!) 107   Temp 97.9 F  (36.6 C) (Oral)   Ht 5\' 7"  (1.702 m)   Wt 261 lb 6.4 oz (118.6 kg)   SpO2 98%   BMI 40.94 kg/m  BP Readings from Last 3 Encounters:  09/28/22 122/72  09/02/22 112/64  07/05/22 130/80   Wt Readings from Last 3 Encounters:  09/28/22 261 lb 6.4 oz (118.6 kg)  09/02/22 268 lb 1.6 oz (121.6 kg)  07/05/22 271 lb 2 oz (123 kg)      Physical Exam Vitals reviewed.  Constitutional:      Appearance: Normal appearance.  Cardiovascular:     Rate and Rhythm: Normal rate and regular rhythm.  Pulmonary:  Effort: Pulmonary effort is normal.     Breath sounds: Normal breath sounds.  Musculoskeletal:     Right lower leg: No edema.     Left lower leg: No edema.     Comments: Multiple scars lumbar region from prior surgeries.  Straight leg raises are negative bilaterally.  No localized tenderness lower back region.  No flank tenderness.  Neurological:     Mental Status: She is alert.     Comments: No focal strength deficits lower extremities with plantarflexion, dorsiflexion, knee extension Deep tendon reflexes are 2+ knee and 1+ ankle bilaterally.  Normal sensory function to touch throughout      No results found for any visits on 09/28/22.    The 10-year ASCVD risk score (Arnett DK, et al., 2019) is: 9%    Assessment & Plan:   Right lower lumbar back pain.  Suspect musculoskeletal strain.  Nonfocal neuroexam.  Urine dipstick did reveal trace blood of uncertain significance.  Urine microscopy sent.  Continue Flexeril as needed.  Wrote for limited meloxicam 15 mg once daily.  Consider physical therapy trial if not improving next couple weeks  Evelena Peat, MD

## 2022-09-29 ENCOUNTER — Encounter: Payer: Self-pay | Admitting: Family Medicine

## 2022-09-30 MED ORDER — VALACYCLOVIR HCL 500 MG PO TABS: ORAL_TABLET | ORAL | 0 refills | Status: DC

## 2022-10-24 ENCOUNTER — Other Ambulatory Visit: Payer: Self-pay | Admitting: Family

## 2022-10-26 ENCOUNTER — Other Ambulatory Visit: Payer: Self-pay | Admitting: Family Medicine

## 2022-11-09 DIAGNOSIS — M9903 Segmental and somatic dysfunction of lumbar region: Secondary | ICD-10-CM | POA: Diagnosis not present

## 2022-11-09 DIAGNOSIS — M9901 Segmental and somatic dysfunction of cervical region: Secondary | ICD-10-CM | POA: Diagnosis not present

## 2022-11-09 DIAGNOSIS — M9902 Segmental and somatic dysfunction of thoracic region: Secondary | ICD-10-CM | POA: Diagnosis not present

## 2022-11-09 DIAGNOSIS — M4802 Spinal stenosis, cervical region: Secondary | ICD-10-CM | POA: Diagnosis not present

## 2022-11-09 DIAGNOSIS — M47816 Spondylosis without myelopathy or radiculopathy, lumbar region: Secondary | ICD-10-CM | POA: Diagnosis not present

## 2022-11-09 DIAGNOSIS — M47812 Spondylosis without myelopathy or radiculopathy, cervical region: Secondary | ICD-10-CM | POA: Diagnosis not present

## 2022-11-09 DIAGNOSIS — M546 Pain in thoracic spine: Secondary | ICD-10-CM | POA: Diagnosis not present

## 2022-11-11 ENCOUNTER — Other Ambulatory Visit: Payer: Self-pay | Admitting: Family Medicine

## 2022-11-15 DIAGNOSIS — M47812 Spondylosis without myelopathy or radiculopathy, cervical region: Secondary | ICD-10-CM | POA: Diagnosis not present

## 2022-11-15 DIAGNOSIS — M9902 Segmental and somatic dysfunction of thoracic region: Secondary | ICD-10-CM | POA: Diagnosis not present

## 2022-11-15 DIAGNOSIS — M546 Pain in thoracic spine: Secondary | ICD-10-CM | POA: Diagnosis not present

## 2022-11-15 DIAGNOSIS — M47816 Spondylosis without myelopathy or radiculopathy, lumbar region: Secondary | ICD-10-CM | POA: Diagnosis not present

## 2022-11-15 DIAGNOSIS — M9903 Segmental and somatic dysfunction of lumbar region: Secondary | ICD-10-CM | POA: Diagnosis not present

## 2022-11-15 DIAGNOSIS — M9901 Segmental and somatic dysfunction of cervical region: Secondary | ICD-10-CM | POA: Diagnosis not present

## 2022-11-15 DIAGNOSIS — M4802 Spinal stenosis, cervical region: Secondary | ICD-10-CM | POA: Diagnosis not present

## 2022-11-27 ENCOUNTER — Other Ambulatory Visit: Payer: Self-pay | Admitting: Family Medicine

## 2022-12-10 ENCOUNTER — Encounter: Payer: Self-pay | Admitting: Family Medicine

## 2022-12-12 MED ORDER — ALPRAZOLAM 1 MG PO TABS: ORAL_TABLET | ORAL | 0 refills | Status: DC

## 2022-12-29 ENCOUNTER — Other Ambulatory Visit: Payer: Self-pay | Admitting: Family Medicine

## 2023-01-17 DIAGNOSIS — L03032 Cellulitis of left toe: Secondary | ICD-10-CM | POA: Diagnosis not present

## 2023-01-17 DIAGNOSIS — L03031 Cellulitis of right toe: Secondary | ICD-10-CM | POA: Diagnosis not present

## 2023-01-17 DIAGNOSIS — M79676 Pain in unspecified toe(s): Secondary | ICD-10-CM | POA: Diagnosis not present

## 2023-01-20 ENCOUNTER — Other Ambulatory Visit: Payer: Self-pay | Admitting: Family Medicine

## 2023-01-20 ENCOUNTER — Telehealth: Payer: Self-pay | Admitting: Family Medicine

## 2023-01-20 DIAGNOSIS — Z0000 Encounter for general adult medical examination without abnormal findings: Secondary | ICD-10-CM

## 2023-01-20 NOTE — Telephone Encounter (Signed)
Requests diagnostic bilateral mammogram with right axilla ultrasound

## 2023-01-20 NOTE — Telephone Encounter (Signed)
Pt is aware waiting on md to put order in

## 2023-01-23 NOTE — Telephone Encounter (Signed)
Left detailed message on mobile phone voicemail informing the patient of the message below.

## 2023-01-23 NOTE — Telephone Encounter (Signed)
I spoke with the patient and she reported she no longer sees GYN. Patient reported she is having right sided arm pain and would like to have mammogram done.

## 2023-01-24 ENCOUNTER — Ambulatory Visit: Payer: Medicare PPO | Admitting: Radiology

## 2023-01-24 VITALS — BP 124/72 | Temp 98.1°F

## 2023-01-24 DIAGNOSIS — N644 Mastodynia: Secondary | ICD-10-CM | POA: Diagnosis not present

## 2023-01-24 NOTE — Progress Notes (Signed)
   DENNISHA MOUSER 29-Apr-1960 [id number]   History:  62 y.o. G1P1 presents with complaints of breast pain in the right breast x 3 weeks, feels like a stinging sensation. No changes to the skin, no insect bite or injury.   Gynecologic History No LMP recorded. Patient has had a hysterectomy.  Last mammogram: 2022. Results were: normal  Obstetric History OB History  Gravida Para Term Preterm AB Living  1 1 1     1   SAB IAB Ectopic Multiple Live Births          1    # Outcome Date GA Lbr Len/2nd Weight Sex Type Anes PTL Lv  1 Term      CS-Unspec   LIV     The following portions of the patient's history were reviewed and updated as appropriate: allergies, current medications, past family history, past medical history, past social history, past surgical history, and problem list.  Review of Systems Pertinent items noted in HPI and remainder of comprehensive ROS otherwise negative.   Past medical history, past surgical history, family history and social history were all reviewed and documented in the EPIC chart.   Exam:  Vitals:   01/24/23 0909  BP: 124/72  Temp: 98.1 F (36.7 C)  TempSrc: Oral   There is no height or weight on file to calculate BMI.  Physical Exam Vitals and nursing note reviewed.  Constitutional:      Appearance: Normal appearance. She is obese.  Pulmonary:     Effort: Pulmonary effort is normal.  Chest:  Breasts:    Right: Tenderness present.     Left: Normal.       Comments: Tender right breast 9 oclock Neurological:     Mental Status: She is alert.      Raynelle Fanning CMA present for exam  Assessment/Plan:   1. Breast pain, right Schedule diagnostic mammogram [facility] Aware she will still need screening mammo on the left as she is overdue. Schedule AEX     Arlie Solomons B WHNP-BC 9:41 AM 01/24/2023

## 2023-01-25 ENCOUNTER — Telehealth: Payer: Self-pay

## 2023-01-25 ENCOUNTER — Other Ambulatory Visit: Payer: Self-pay | Admitting: Family Medicine

## 2023-01-25 DIAGNOSIS — M47812 Spondylosis without myelopathy or radiculopathy, cervical region: Secondary | ICD-10-CM | POA: Diagnosis not present

## 2023-01-25 DIAGNOSIS — M9903 Segmental and somatic dysfunction of lumbar region: Secondary | ICD-10-CM | POA: Diagnosis not present

## 2023-01-25 DIAGNOSIS — M9902 Segmental and somatic dysfunction of thoracic region: Secondary | ICD-10-CM | POA: Diagnosis not present

## 2023-01-25 DIAGNOSIS — M4802 Spinal stenosis, cervical region: Secondary | ICD-10-CM | POA: Diagnosis not present

## 2023-01-25 DIAGNOSIS — M546 Pain in thoracic spine: Secondary | ICD-10-CM | POA: Diagnosis not present

## 2023-01-25 DIAGNOSIS — N644 Mastodynia: Secondary | ICD-10-CM

## 2023-01-25 DIAGNOSIS — M9901 Segmental and somatic dysfunction of cervical region: Secondary | ICD-10-CM | POA: Diagnosis not present

## 2023-01-25 DIAGNOSIS — M47816 Spondylosis without myelopathy or radiculopathy, lumbar region: Secondary | ICD-10-CM | POA: Diagnosis not present

## 2023-01-25 NOTE — Telephone Encounter (Signed)
-----   Message from [place], [place] B sent at 01/24/2023  9:40 AM EST ----- Regarding: Mammo and u/s Please schedule appt for diagnostic mammogram and ultrasound for right breast pain at [facility].

## 2023-01-25 NOTE — Telephone Encounter (Signed)
Pt scheduled for tomorrow 01/26/2023 @ 11am.  Called pt, no answer, LDVM on machine per DPR.  Please co sign order at your earliest convenience.   Routing to provider for final review and closing encounter.

## 2023-01-26 ENCOUNTER — Ambulatory Visit
Admission: RE | Admit: 2023-01-26 | Discharge: 2023-01-26 | Disposition: A | Discharge status: Home / Self Care | Payer: Medicare PPO | Source: Ambulatory Visit | Attending: Radiology | Admitting: Radiology

## 2023-01-26 DIAGNOSIS — N644 Mastodynia: Secondary | ICD-10-CM

## 2023-02-01 ENCOUNTER — Encounter: Payer: Self-pay | Admitting: Family Medicine

## 2023-02-01 NOTE — Telephone Encounter (Signed)
Care team updated and letter sent for eye exam notes.

## 2023-02-08 DIAGNOSIS — M9901 Segmental and somatic dysfunction of cervical region: Secondary | ICD-10-CM | POA: Diagnosis not present

## 2023-02-08 DIAGNOSIS — M9903 Segmental and somatic dysfunction of lumbar region: Secondary | ICD-10-CM | POA: Diagnosis not present

## 2023-02-08 DIAGNOSIS — M9902 Segmental and somatic dysfunction of thoracic region: Secondary | ICD-10-CM | POA: Diagnosis not present

## 2023-02-08 DIAGNOSIS — M47816 Spondylosis without myelopathy or radiculopathy, lumbar region: Secondary | ICD-10-CM | POA: Diagnosis not present

## 2023-02-08 DIAGNOSIS — M4802 Spinal stenosis, cervical region: Secondary | ICD-10-CM | POA: Diagnosis not present

## 2023-02-08 DIAGNOSIS — M47812 Spondylosis without myelopathy or radiculopathy, cervical region: Secondary | ICD-10-CM | POA: Diagnosis not present

## 2023-02-08 DIAGNOSIS — M546 Pain in thoracic spine: Secondary | ICD-10-CM | POA: Diagnosis not present

## 2023-02-09 ENCOUNTER — Other Ambulatory Visit: Payer: Self-pay | Admitting: Family Medicine

## 2023-02-22 DIAGNOSIS — Z1283 Encounter for screening for malignant neoplasm of skin: Secondary | ICD-10-CM | POA: Diagnosis not present

## 2023-02-22 DIAGNOSIS — D225 Melanocytic nevi of trunk: Secondary | ICD-10-CM | POA: Diagnosis not present

## 2023-02-26 ENCOUNTER — Other Ambulatory Visit: Payer: Self-pay | Admitting: Family Medicine

## 2023-02-28 ENCOUNTER — Encounter: Payer: Medicare PPO | Admitting: Radiology

## 2023-02-28 ENCOUNTER — Encounter: Payer: Self-pay | Admitting: Family Medicine

## 2023-02-28 DIAGNOSIS — M961 Postlaminectomy syndrome, not elsewhere classified: Secondary | ICD-10-CM | POA: Diagnosis not present

## 2023-02-28 DIAGNOSIS — Z9689 Presence of other specified functional implants: Secondary | ICD-10-CM | POA: Diagnosis not present

## 2023-03-01 ENCOUNTER — Other Ambulatory Visit: Payer: Self-pay | Admitting: Family Medicine

## 2023-03-01 MED ORDER — VILAZODONE HCL 40 MG PO TABS: 40.0000 mg | ORAL_TABLET | Freq: Every day | ORAL | 3 refills | Status: DC

## 2023-03-11 ENCOUNTER — Other Ambulatory Visit: Payer: Self-pay | Admitting: Family Medicine

## 2023-03-16 ENCOUNTER — Other Ambulatory Visit: Payer: Self-pay | Admitting: Family Medicine

## 2023-04-13 ENCOUNTER — Encounter: Payer: Self-pay | Admitting: Radiology

## 2023-04-13 ENCOUNTER — Other Ambulatory Visit (HOSPITAL_COMMUNITY)
Admission: RE | Admit: 2023-04-13 | Discharge: 2023-04-13 | Disposition: A | Discharge status: Home / Self Care | Payer: Medicare PPO | Source: Ambulatory Visit | Attending: Radiology | Admitting: Radiology

## 2023-04-13 ENCOUNTER — Ambulatory Visit: Payer: Medicare PPO | Admitting: Radiology

## 2023-04-13 VITALS — BP 130/80 | HR 92 | Ht 65.5 in | Wt 278.0 lb

## 2023-04-13 DIAGNOSIS — Z01419 Encounter for gynecological examination (general) (routine) without abnormal findings: Secondary | ICD-10-CM | POA: Diagnosis not present

## 2023-04-13 DIAGNOSIS — Z1151 Encounter for screening for human papillomavirus (HPV): Secondary | ICD-10-CM | POA: Insufficient documentation

## 2023-04-13 NOTE — Progress Notes (Signed)
   Taylor Ritter 11-Mar-1961 [id number]   History:  63 y.o. G1P1 presents for annual exam. No new gyn concerns. Has a PCP.  Gynecologic History Hysterectomy  Sexually active: no  Health Maintenance Last Pap: 2013. Results were: normal Last mammogram: 01/26/23. Results were: normal Last colonoscopy: 2022    Past medical history, past surgical history, family history and social history were all reviewed and documented in the EPIC chart.  ROS:  A ROS was performed and pertinent positives and negatives are included.  Exam:  Vitals:   04/13/23 1323  BP: 130/80  Pulse: 92  SpO2: 93%  Weight: 278 lb (126.1 kg)  Height: 5' 5.5" (1.664 m)   Body mass index is 45.56 kg/m.  General appearance:  Normal, obese Thyroid:  Symmetrical, normal in size, without palpable masses or nodularity. Respiratory  Auscultation:  Clear without wheezing or rhonchi Cardiovascular  Auscultation:  Regular rate, without rubs, murmurs or gallops  Edema/varicosities:  Not grossly evident Abdominal  Soft,nontender, without masses, guarding or rebound.  Liver/spleen:  No organomegaly noted  Hernia:  None appreciated  Skin  Inspection:  Grossly normal Breasts: Examined lying and sitting.   Right: Without masses, retractions, nipple discharge or axillary adenopathy.   Left: Without masses, retractions, nipple discharge or axillary adenopathy. Genitourinary   Inguinal/mons:  Normal without inguinal adenopathy  External genitalia:  Normal appearing vulva with no masses, tenderness, or lesions  BUS/Urethra/Skene's glands:  Normal  Vagina:  Normal appearing with normal color and discharge, no lesions. Atrophy mild  Cervix:  absent  Uterus:  absent  Adnexa/parametria:     Rt: Normal in size, without masses or tenderness.   Lt: Normal in size, without masses or tenderness.  Anus and perineum: Normal   Raynelle Fanning, CMA present for exam  Assessment/Plan:   1. Encounter for breast and pelvic  examination (Primary) - Cytology - PAP( [place])     Return in 2 year for annual or sooner prn.  Arlie Solomons B WHNP-BC 1:45 PM 04/13/2023

## 2023-04-14 ENCOUNTER — Other Ambulatory Visit: Payer: Self-pay | Admitting: Family Medicine

## 2023-04-18 ENCOUNTER — Encounter: Payer: Self-pay | Admitting: Nurse Practitioner

## 2023-04-18 LAB — CYTOLOGY - PAP
Comment: NEGATIVE
Diagnosis: NEGATIVE
High risk HPV: NEGATIVE

## 2023-04-19 DIAGNOSIS — K219 Gastro-esophageal reflux disease without esophagitis: Secondary | ICD-10-CM | POA: Diagnosis not present

## 2023-04-26 ENCOUNTER — Other Ambulatory Visit: Payer: Self-pay | Admitting: Family Medicine

## 2023-04-26 DIAGNOSIS — I10 Essential (primary) hypertension: Secondary | ICD-10-CM

## 2023-05-09 ENCOUNTER — Other Ambulatory Visit: Payer: Self-pay | Admitting: Family Medicine

## 2023-05-18 DIAGNOSIS — H02422 Myogenic ptosis of left eyelid: Secondary | ICD-10-CM | POA: Diagnosis not present

## 2023-05-18 DIAGNOSIS — H53483 Generalized contraction of visual field, bilateral: Secondary | ICD-10-CM | POA: Diagnosis not present

## 2023-05-18 DIAGNOSIS — H0279 Other degenerative disorders of eyelid and periocular area: Secondary | ICD-10-CM | POA: Diagnosis not present

## 2023-05-18 DIAGNOSIS — H02421 Myogenic ptosis of right eyelid: Secondary | ICD-10-CM | POA: Diagnosis not present

## 2023-05-18 DIAGNOSIS — H02423 Myogenic ptosis of bilateral eyelids: Secondary | ICD-10-CM | POA: Diagnosis not present

## 2023-05-18 DIAGNOSIS — H02834 Dermatochalasis of left upper eyelid: Secondary | ICD-10-CM | POA: Diagnosis not present

## 2023-05-18 DIAGNOSIS — H57813 Brow ptosis, bilateral: Secondary | ICD-10-CM | POA: Diagnosis not present

## 2023-05-18 DIAGNOSIS — H02831 Dermatochalasis of right upper eyelid: Secondary | ICD-10-CM | POA: Diagnosis not present

## 2023-05-25 ENCOUNTER — Other Ambulatory Visit: Payer: Self-pay | Admitting: Pulmonary Disease

## 2023-05-25 DIAGNOSIS — R0982 Postnasal drip: Secondary | ICD-10-CM

## 2023-05-26 ENCOUNTER — Other Ambulatory Visit: Payer: Self-pay

## 2023-05-26 DIAGNOSIS — R0982 Postnasal drip: Secondary | ICD-10-CM

## 2023-05-29 MED ORDER — FLUTICASONE PROPIONATE 50 MCG/ACT NA SUSP: 1.0000 | Freq: Every day | NASAL | 5 refills | Status: DC | PRN

## 2023-06-10 ENCOUNTER — Encounter: Payer: Self-pay | Admitting: Family Medicine

## 2023-06-11 ENCOUNTER — Other Ambulatory Visit: Payer: Self-pay | Admitting: Family Medicine

## 2023-06-12 MED ORDER — CYANOCOBALAMIN 1000 MCG/ML IJ SOLN: 1000.0000 ug | INTRAMUSCULAR | 3 refills | Status: AC

## 2023-06-23 ENCOUNTER — Other Ambulatory Visit: Payer: Self-pay | Admitting: Pulmonary Disease

## 2023-06-23 DIAGNOSIS — R0982 Postnasal drip: Secondary | ICD-10-CM

## 2023-07-05 ENCOUNTER — Other Ambulatory Visit: Payer: Self-pay | Admitting: Family Medicine

## 2023-07-05 ENCOUNTER — Ambulatory Visit: Payer: Medicare PPO

## 2023-07-05 VITALS — Ht 65.5 in | Wt 278.0 lb

## 2023-07-05 DIAGNOSIS — Z0000 Encounter for general adult medical examination without abnormal findings: Secondary | ICD-10-CM

## 2023-07-05 MED ORDER — VALACYCLOVIR HCL 500 MG PO TABS: ORAL_TABLET | ORAL | 0 refills | Status: DC

## 2023-07-05 MED ORDER — BUPROPION HCL ER (XL) 150 MG PO TB24: ORAL_TABLET | ORAL | 0 refills | Status: DC

## 2023-07-05 MED ORDER — POTASSIUM CHLORIDE ER 10 MEQ PO TBCR: 20.0000 meq | EXTENDED_RELEASE_TABLET | Freq: Every day | ORAL | 0 refills | Status: DC

## 2023-07-05 NOTE — Progress Notes (Signed)
 Subjective:   Taylor Ritter is a 62 y.o. who presents for a Medicare Wellness preventive visit.  Visit Complete: Virtual I connected with  Taylor Ritter on 07/05/23 by a audio enabled telemedicine application and verified that I am speaking with the correct person using two identifiers.  Patient Location: Home  Provider Location: Home Office  I discussed the limitations of evaluation and management by telemedicine. The patient expressed understanding and agreed to proceed.  Vital Signs: Because this visit was a virtual/telehealth visit, some criteria may be missing or patient reported. Any vitals not documented were not able to be obtained and vitals that have been documented are patient reported.    Persons Participating in Visit: Patient.  AWV Questionnaire: No: Patient Medicare AWV questionnaire was not completed prior to this visit.  Cardiac Risk Factors include: advanced age (>33men, >1 women);diabetes mellitus;hypertension     Objective:    Today's Vitals   07/05/23 1126  Weight: 278 lb (126.1 kg)  Height: 5' 5.5" (1.664 m)   Body mass index is 45.56 kg/m.     07/05/2023   11:35 AM 06/29/2022   10:28 AM 06/25/2021    8:19 AM 05/04/2021   11:40 AM 12/18/2020    9:30 AM 05/26/2020   11:21 AM 09/10/2019    3:01 PM  Advanced Directives  Does Patient Have a Medical Advance Directive? No No No No No No No  Would patient like information on creating a medical advance directive? No - Patient declined No - Patient declined No - Patient declined  No - Patient declined No - Patient declined     Current Medications (verified) Outpatient Encounter Medications as of 07/05/2023  Medication Sig   albuterol (VENTOLIN HFA) 108 (90 Base) MCG/ACT inhaler Inhale 1-2 puffs into the lungs every 6 (six) hours as needed for wheezing or shortness of breath.   ALPRAZolam (XANAX) 1 MG tablet Take one tablet by mouth daily as needed for severe anxiety.   BIOTIN PO Take by mouth.    buPROPion (WELLBUTRIN XL) 150 MG 24 hr tablet TAKE 3 TABLETS BY MOUTH DAILY. PLEASE SCHEDULE A PHYSICAL FOR MORE REFILLS. THANKS   Calcium-Magnesium-Vitamin D (CALCIUM MAGNESIUM PO) Take by mouth.   Cetirizine HCl (ZYRTEC ALLERGY PO) Take by mouth.   CINNAMON PO Take by mouth.   cyanocobalamin (VITAMIN B12) 1000 MCG/ML injection Inject 1 mL (1,000 mcg total) into the muscle every 30 (thirty) days.   cyclobenzaprine (FLEXERIL) 5 MG tablet Take 5 mg by mouth 2 (two) times daily as needed for muscle spasms.   diphenhydrAMINE (BENADRYL) 50 MG capsule Take 50 mg by mouth every 6 (six) hours as needed for allergies.   Estradiol 10 MCG TABS vaginal tablet Place 1 tablet (10 mcg total) vaginally 2 (two) times a week.   fluticasone (FLONASE) 50 MCG/ACT nasal spray Place 1 spray into both nostrils daily as needed for allergies.   furosemide (LASIX) 20 MG tablet TAKE ONE TABLET BY MOUTH DAILY AS NEEDED FOR EDEMA   GABAPENTIN PO Take by mouth. Take 2 tablets at bedtime   hydrochlorothiazide (HYDRODIURIL) 25 MG tablet TAKE 1 TABLET (25 MG TOTAL) BY MOUTH DAILY.   ibuprofen (ADVIL) 200 MG tablet Take 800 mg by mouth every 8 (eight) hours as needed (for pain.).   ipratropium (ATROVENT) 0.03 % nasal spray Place 2 sprays into both nostrils every 12 (twelve) hours.   levETIRAcetam (KEPPRA) 500 MG tablet Take 2 tablets (1,000 mg total) by mouth at bedtime.  linaclotide (LINZESS) 290 MCG CAPS capsule Take 580 mcg by mouth in the morning.   montelukast (SINGULAIR) 10 MG tablet TAKE 1 TABLET BY MOUTH EVERYDAY AT BEDTIME   Multiple Vitamin (MULTIVITAMIN WITH MINERALS) TABS tablet Take 1 tablet by mouth daily. One A Day for Women   nystatin (MYCOSTATIN/NYSTOP) powder Apply topically 4 (four) times daily.   nystatin ointment (MYCOSTATIN) APPLY 1 APPLICATION TOPICALLY 2 (TWO) TIMES DAILY AS NEEDED   pantoprazole (PROTONIX) 40 MG tablet Take 40 mg by mouth daily.   pilocarpine (SALAGEN) 5 MG tablet Take 5 mg by mouth 2  (two) times daily as needed (dryness).   potassium chloride (KLOR-CON) 10 MEQ tablet TAKE 2 TABLETS BY MOUTH EVERY DAY   tiZANidine (ZANAFLEX) 4 MG tablet Take 1 tablet by mouth 2 (two) times daily as needed.   topiramate (TOPAMAX) 100 MG tablet Take 2 tablets (200 mg total) by mouth at bedtime.   valACYclovir (VALTREX) 500 MG tablet TAKE 1 TAB DAILY, MAY TAKE ADDITIONAL 1 TAB IF NEEDED FOR OUTBREAK. NEED EXAM WITH LABS FOR REFILLS   Vilazodone HCl (VIIBRYD) 40 MG TABS Take 1 tablet (40 mg total) by mouth daily.   vitamin B-12 (CYANOCOBALAMIN) 1000 MCG tablet Take 1,000 mcg by mouth in the morning. (Patient not taking: Reported on 04/13/2023)   VITAMIN D PO Take by mouth.   No facility-administered encounter medications on file as of 07/05/2023.    Allergies (verified) Other, Percocet [oxycodone-acetaminophen], Augmentin [amoxicillin-pot clavulanate], Codeine, Dye fdc red [red dye #40 (allura red)], Morphine and codeine, and Vesicare [solifenacin succinate]   History: Past Medical History:  Diagnosis Date   Allergy    takes Allegra daily and uses nasal spray daily.Takes Benadryl as needed.Uses eye drops daily   Anemia    B 12 shots monthly   Anxiety    takes Xanax as needed   Arthritis    osteoporosis   Asthma    Proventil inhaler as needed   Chronic back pain    DDD and stenosis   Complication of anesthesia    pt states she was woke up during surgery   Depression    takes Wellbutrin daily   Fibromyalgia    GERD (gastroesophageal reflux disease)    takes Nexium daily   Headache(784.0)    takes Topamax daily as needed   History of bronchitis    2016   History of colon polyps    benign   History of vertigo    took Meclizine several yrs ago   Hypertension    followed by [facility]   IBS (irritable bowel syndrome)    takes LInzess daily   Insomnia    Joint pain    Muscle spasm    takes Flexeril daily as needed   Neuromuscular disorder (HCC)     numbness arms legs,feet    Nocturia    Osteoporosis    Pain    takes Keppra and Topamax daily   Peripheral edema    takes HCTZ daily   Sleep apnea    has a CPAP but doesn't wear often   Type 2 diabetes mellitus with hyperglycemia (HCC) 09/22/2021   Urinary frequency    Urinary incontinence    Urinary urgency    Past Surgical History:  Procedure Laterality Date   ABDOMINAL HYSTERECTOMY     ANTERIOR FUSION CERVICAL SPINE     BACK SURGERY  2012   bladder tacked     CARPAL TUNNEL RELEASE  R&L   CESAREAN SECTION  1983   CHOLECYSTECTOMY     COLONOSCOPY     COLONOSCOPY WITH PROPOFOL N/A 10/06/2017   Procedure: COLONOSCOPY WITH PROPOFOL;  Surgeon: Jeani Hawking, MD;  Location: [facility];  Service: Endoscopy;  Laterality: N/A;   COLONOSCOPY WITH PROPOFOL N/A 12/18/2020   Procedure: COLONOSCOPY WITH PROPOFOL;  Surgeon: Jeani Hawking, MD;  Location: [facility];  Service: Endoscopy;  Laterality: N/A;   ESOPHAGEAL DILATION     ESOPHAGOGASTRODUODENOSCOPY (EGD) WITH PROPOFOL N/A 06/25/2021   Procedure: ESOPHAGOGASTRODUODENOSCOPY (EGD) WITH PROPOFOL;  Surgeon: Jeani Hawking, MD;  Location: [facility];  Service: Endoscopy;  Laterality: N/A;   KNEE ARTHROSCOPY     L KNEE   LUMBAR LAMINECTOMY     LUMBAR LAMINECTOMY/DECOMPRESSION MICRODISCECTOMY  02/25/2011   Procedure: LUMBAR LAMINECTOMY/DECOMPRESSION MICRODISCECTOMY;  Surgeon: Karn Cassis;  Location: [facility];  Service: Neurosurgery;  Laterality: N/A;  Lumbar Four-Five Microdiscectomy   LUMBAR WOUND DEBRIDEMENT  03/18/2011   Procedure: LUMBAR WOUND DEBRIDEMENT;  Surgeon: Karn Cassis;  Location: [facility];  Service: Neurosurgery;  Laterality: N/A;  Exploration of Lumbar wound   POLYPECTOMY  10/06/2017   Procedure: POLYPECTOMY;  Surgeon: Jeani Hawking, MD;  Location: [facility];  Service: Endoscopy;;   POLYPECTOMY  12/18/2020   Procedure: POLYPECTOMY;  Surgeon: Jeani Hawking, MD;  Location: [facility];  Service:  Endoscopy;;   SAVORY DILATION N/A 06/25/2021   Procedure: Gaspar Bidding DILATION;  Surgeon: Jeani Hawking, MD;  Location: [facility];  Service: Endoscopy;  Laterality: N/A;   SEPTOPLASTY  10+yrs ago   SPINAL CORD STIMULATOR INSERTION N/A 12/11/2015   Procedure: LUMBAR SPINAL CORD STIMULATOR INSERTION;  Surgeon: Odette Fraction, MD;  Location: [facility];  Service: Neurosurgery;  Laterality: N/A;   Family History  Problem Relation Age of Onset   Arthritis Mother    Hypertension Mother    Kidney disease Mother    COPD Mother    Heart disease Mother 8       CAD   Diabetes Mother    Mental illness Mother    Arthritis Father    Diabetes Father    Heart disease Father    Cancer Father        prostate cancer   Mental illness Father        Alzheimers Dementia   Cancer Maternal Grandmother        brain tumor   Cancer Maternal Grandfather        lung   Diabetes Maternal Grandfather    Early death Brother    Anesthesia problems Neg Hx    Hypotension Neg Hx    Malignant hyperthermia Neg Hx    Pseudochol deficiency Neg Hx    Social History   Socioeconomic History   Marital status: Married    Spouse name: Not on file   Number of children: Not on file   Years of education: Not on file   Highest education level: Not on file  Occupational History   Not on file  Tobacco Use   Smoking status: Never    Passive exposure: Never   Smokeless tobacco: Never  Vaping Use   Vaping status: Never Used  Substance and Sexual Activity   Alcohol use: No   Drug use: No   Sexual activity: Yes    Partners: Male    Birth control/protection: Surgical    Comment: menarche 63yo, sexual debut 63yo  Other Topics Concern   Not on file  Social History Narrative   Not  on file   Social Drivers of Health   Financial Resource Strain: Low Risk  (07/05/2023)   Overall Financial Resource Strain (CARDIA)    Difficulty of Paying Living Expenses: Not hard at all  Food Insecurity: No Food Insecurity (07/05/2023)    Hunger Vital Sign    Worried About Running Out of Food in the Last Year: Never true    Ran Out of Food in the Last Year: Never true  Transportation Needs: No Transportation Needs (07/05/2023)   PRAPARE - Administrator, Civil Service (Medical): No    Lack of Transportation (Non-Medical): No  Physical Activity: Inactive (07/05/2023)   Exercise Vital Sign    Days of Exercise per Week: 0 days    Minutes of Exercise per Session: 0 min  Stress: Stress Concern Present (07/05/2023)   [employer] of Occupational Health - Occupational Stress Questionnaire    Feeling of Stress : Rather much  Social Connections: Socially Integrated (07/05/2023)   Social Connection and Isolation Panel [NHANES]    Frequency of Communication with Friends and Family: More than three times a week    Frequency of Social Gatherings with Friends and Family: More than three times a week    Attends Religious Services: More than 4 times per year    Active Member of [employer] or Organizations: Yes    Attends Engineer, structural: More than 4 times per year    Marital Status: Married    Tobacco Counseling Counseling given: Not Answered    Clinical Intake:  Pre-visit preparation completed: Yes  Pain : No/denies pain     BMI - recorded: 45.56 Nutritional Status: BMI > 30  Obese Nutritional Risks: None Diabetes: Yes CBG done?: No Did pt. bring in CBG monitor from home?: No  Lab Results  Component Value Date   HGBA1C 5.8 09/02/2022   HGBA1C 5.9 (A) 01/05/2022   HGBA1C 6.5 (A) 09/22/2021     How often do you need to have someone help you when you read instructions, pamphlets, or other written materials from your doctor or pharmacy?: 1 - Never  Interpreter Needed?: No  Information entered by :: Theresa Mulligan  LPN   Activities of Daily Living     07/05/2023   11:34 AM  In your present state of health, do you have any difficulty performing the following activities:  Hearing? 0   Vision? 0  Difficulty concentrating or making decisions? 0  Walking or climbing stairs? 0  Dressing or bathing? 0  Doing errands, shopping? 0  Preparing Food and eating ? N  Using the Toilet? N  In the past six months, have you accidently leaked urine? N  Do you have problems with loss of bowel control? N  Managing your Medications? N  Managing your Finances? N  Housekeeping or managing your Housekeeping? N    Patient Care Team: Kristian Covey, MD as PCP - General (Family Medicine) [facility] (Ophthalmology)  Indicate any recent Medical Services you may have received from other than [facility] providers in the past year (date may be approximate).     Assessment:   This is a routine wellness examination for Taylor Ritter.  Hearing/Vision screen Hearing Screening - Comments:: Denies hearing difficulties   Vision Screening - Comments:: Wears rx glasses - up to date with routine eye exams with  Dr Shea Evans   Goals Addressed               This Visit's Progress  Increase physical activity (pt-stated)        Remain active.       Depression Screen     07/05/2023   11:33 AM 09/02/2022    8:10 AM 06/29/2022   10:25 AM 06/24/2022   11:12 AM 01/18/2022    3:23 PM 01/05/2022    9:11 AM 09/22/2021    8:05 AM  PHQ 2/9 Scores  PHQ - 2 Score 0 0 0 0 0 2 2  PHQ- 9 Score  1    8 11     Fall Risk     07/05/2023   11:35 AM 09/02/2022   11:58 AM 06/29/2022   10:27 AM 06/24/2022   11:11 AM 01/05/2022    9:11 AM  Fall Risk   Falls in the past year? 0 1 1 0 1  Number falls in past yr: 0 1 0 1 0  Injury with Fall? 0 0 0 0 0  Comment   Followed by medical attntion    Risk for fall due to : No Fall Risks Other (Comment) No Fall Risks No Fall Risks No Fall Risks  Follow up Falls prevention discussed;Falls evaluation completed Falls evaluation completed Falls prevention discussed  Falls evaluation completed    MEDICARE RISK AT HOME:  Medicare Risk at Home Any stairs in or  around the home?: No If so, are there any without handrails?: No Home free of loose throw rugs in walkways, pet beds, electrical cords, etc?: Yes Adequate lighting in your home to reduce risk of falls?: Yes Life alert?: No Use of a cane, walker or w/c?: No Grab bars in the bathroom?: No Shower chair or bench in shower?: No Elevated toilet seat or a handicapped toilet?: No  TIMED UP AND GO:  Was the test performed?  No  Cognitive Function: 6CIT completed        07/05/2023   11:35 AM 06/29/2022   10:28 AM  6CIT Screen  What Year? 0 points 0 points  What month? 0 points 0 points  What time? 0 points 0 points  Count back from 20 0 points 0 points  Months in reverse 0 points 0 points  Repeat phrase 0 points 0 points  Total Score 0 points 0 points    Immunizations Immunization History  Administered Date(s) Administered   Influenza Split 03/19/2011   Influenza,inj,Quad PF,6+ Mos 01/02/2013, 11/13/2015, 12/30/2015   Moderna Sars-Covid-2 Vaccination 05/30/2019, 06/29/2019, 03/02/2020   Pneumococcal Polysaccharide-23 08/02/2012   Tdap 08/09/2012    Screening Tests Health Maintenance  Topic Date Due   FOOT EXAM  Never done   HIV Screening  Never done   Hepatitis C Screening  Never done   Zoster Vaccines- Shingrix (1 of 2) Never done   Pneumococcal Vaccine 44-73 Years old (2 of 2 - PCV) 08/02/2013   DTaP/Tdap/Td (2 - Td or Tdap) 08/10/2022   COVID-19 Vaccine (4 - 2024-25 season) 11/20/2022   HEMOGLOBIN A1C  03/04/2023   Diabetic kidney evaluation - eGFR measurement  09/02/2023   Diabetic kidney evaluation - Urine ACR  09/02/2023   INFLUENZA VACCINE  10/20/2023   OPHTHALMOLOGY EXAM  12/20/2023   Medicare Annual Wellness (AWV)  07/04/2024   MAMMOGRAM  01/25/2025   Colonoscopy  12/18/2025   Cervical Cancer Screening (HPV/Pap Cotest)  04/12/2028   HPV VACCINES  Aged Out   Meningococcal B Vaccine  Aged Out    Health Maintenance  Health Maintenance Due  Topic Date Due    FOOT EXAM  Never done  HIV Screening  Never done   Hepatitis C Screening  Never done   Zoster Vaccines- Shingrix (1 of 2) Never done   Pneumococcal Vaccine 68-74 Years old (2 of 2 - PCV) 08/02/2013   DTaP/Tdap/Td (2 - Td or Tdap) 08/10/2022   COVID-19 Vaccine (4 - 2024-25 season) 11/20/2022   HEMOGLOBIN A1C  03/04/2023   Health Maintenance Items Addressed:  Patient declined Zoster/Shingles and Pneumococcal Vaccines. Patient deferred HIV, Hep-C and Hemoglobin A1C Screenings  Additional Screening:  Vision Screening: Recommended annual ophthalmology exams for early detection of glaucoma and other disorders of the eye.  Dental Screening: Recommended annual dental exams for proper oral hygiene  Community Resource Referral / Chronic Care Management: CRR required this visit?  No   CCM required this visit?  No     Plan:     I have personally reviewed and noted the following in the patient's chart:   Medical and social history Use of alcohol, tobacco or illicit drugs  Current medications and supplements including opioid prescriptions. Patient is not currently taking opioid prescriptions. Functional ability and status Nutritional status Physical activity Advanced directives List of other physicians Hospitalizations, surgeries, and ER visits in previous 12 months Vitals Screenings to include cognitive, depression, and falls Referrals and appointments  In addition, I have reviewed and discussed with patient certain preventive protocols, quality metrics, and best practice recommendations. A written personalized care plan for preventive services as well as general preventive health recommendations were provided to patient.     Dewayne Ford, LPN   [id number]/52/[id number]   After Visit Summary: (MyChart) Due to this being a telephonic visit, the after visit summary with patients personalized plan was offered to patient via MyChart   Notes: Nothing significant to report at this time.

## 2023-07-05 NOTE — Patient Instructions (Addendum)
 Taylor Ritter , Thank you for taking time to come for your Medicare Wellness Visit. I appreciate your ongoing commitment to your health goals. Please review the following plan we discussed and let me know if I can assist you in the future.   Referrals/Orders/Follow-Ups/Clinician Recommendations: Call to make appointment for labs due.  This is a list of the screening recommended for you and due dates:  Health Maintenance  Topic Date Due   Complete foot exam   Never done   HIV Screening  Never done   Hepatitis C Screening  Never done   Zoster (Shingles) Vaccine (1 of 2) Never done   Pneumococcal Vaccination (2 of 2 - PCV) 08/02/2013   DTaP/Tdap/Td vaccine (2 - Td or Tdap) 08/10/2022   COVID-19 Vaccine (4 - 2024-25 season) 11/20/2022   Hemoglobin A1C  03/04/2023   Yearly kidney function blood test for diabetes  09/02/2023   Yearly kidney health urinalysis for diabetes  09/02/2023   Flu Shot  10/20/2023   Eye exam for diabetics  12/20/2023   Medicare Annual Wellness Visit  07/04/2024   Mammogram  01/25/2025   Colon Cancer Screening  12/18/2025   Pap with HPV screening  04/12/2028   HPV Vaccine  Aged Out   Meningitis B Vaccine  Aged Out    Advanced directives: (Declined) Advance directive discussed with you today. Even though you declined this today, please call our office should you change your mind, and we can give you the proper paperwork for you to fill out.  Next Medicare Annual Wellness Visit scheduled for next year: Yes

## 2023-07-10 MED ORDER — ALPRAZOLAM 1 MG PO TABS: ORAL_TABLET | ORAL | 0 refills | Status: DC

## 2023-07-13 DIAGNOSIS — M9901 Segmental and somatic dysfunction of cervical region: Secondary | ICD-10-CM | POA: Diagnosis not present

## 2023-07-13 DIAGNOSIS — M546 Pain in thoracic spine: Secondary | ICD-10-CM | POA: Diagnosis not present

## 2023-07-13 DIAGNOSIS — M4802 Spinal stenosis, cervical region: Secondary | ICD-10-CM | POA: Diagnosis not present

## 2023-07-13 DIAGNOSIS — M9902 Segmental and somatic dysfunction of thoracic region: Secondary | ICD-10-CM | POA: Diagnosis not present

## 2023-07-13 DIAGNOSIS — M9903 Segmental and somatic dysfunction of lumbar region: Secondary | ICD-10-CM | POA: Diagnosis not present

## 2023-07-13 DIAGNOSIS — M47816 Spondylosis without myelopathy or radiculopathy, lumbar region: Secondary | ICD-10-CM | POA: Diagnosis not present

## 2023-07-13 DIAGNOSIS — M47812 Spondylosis without myelopathy or radiculopathy, cervical region: Secondary | ICD-10-CM | POA: Diagnosis not present

## 2023-07-20 DIAGNOSIS — M4802 Spinal stenosis, cervical region: Secondary | ICD-10-CM | POA: Diagnosis not present

## 2023-07-20 DIAGNOSIS — M47816 Spondylosis without myelopathy or radiculopathy, lumbar region: Secondary | ICD-10-CM | POA: Diagnosis not present

## 2023-07-20 DIAGNOSIS — M9902 Segmental and somatic dysfunction of thoracic region: Secondary | ICD-10-CM | POA: Diagnosis not present

## 2023-07-20 DIAGNOSIS — M47812 Spondylosis without myelopathy or radiculopathy, cervical region: Secondary | ICD-10-CM | POA: Diagnosis not present

## 2023-07-20 DIAGNOSIS — M9901 Segmental and somatic dysfunction of cervical region: Secondary | ICD-10-CM | POA: Diagnosis not present

## 2023-07-20 DIAGNOSIS — M9903 Segmental and somatic dysfunction of lumbar region: Secondary | ICD-10-CM | POA: Diagnosis not present

## 2023-07-20 DIAGNOSIS — M546 Pain in thoracic spine: Secondary | ICD-10-CM | POA: Diagnosis not present

## 2023-07-25 DIAGNOSIS — M47816 Spondylosis without myelopathy or radiculopathy, lumbar region: Secondary | ICD-10-CM | POA: Diagnosis not present

## 2023-07-25 DIAGNOSIS — M4802 Spinal stenosis, cervical region: Secondary | ICD-10-CM | POA: Diagnosis not present

## 2023-07-25 DIAGNOSIS — M9903 Segmental and somatic dysfunction of lumbar region: Secondary | ICD-10-CM | POA: Diagnosis not present

## 2023-07-25 DIAGNOSIS — M546 Pain in thoracic spine: Secondary | ICD-10-CM | POA: Diagnosis not present

## 2023-07-25 DIAGNOSIS — M9901 Segmental and somatic dysfunction of cervical region: Secondary | ICD-10-CM | POA: Diagnosis not present

## 2023-07-25 DIAGNOSIS — M9902 Segmental and somatic dysfunction of thoracic region: Secondary | ICD-10-CM | POA: Diagnosis not present

## 2023-07-25 DIAGNOSIS — M47812 Spondylosis without myelopathy or radiculopathy, cervical region: Secondary | ICD-10-CM | POA: Diagnosis not present

## 2023-07-27 DIAGNOSIS — L03031 Cellulitis of right toe: Secondary | ICD-10-CM | POA: Diagnosis not present

## 2023-07-29 ENCOUNTER — Other Ambulatory Visit: Payer: Self-pay | Admitting: Family Medicine

## 2023-07-31 DIAGNOSIS — M47812 Spondylosis without myelopathy or radiculopathy, cervical region: Secondary | ICD-10-CM | POA: Diagnosis not present

## 2023-07-31 DIAGNOSIS — M9903 Segmental and somatic dysfunction of lumbar region: Secondary | ICD-10-CM | POA: Diagnosis not present

## 2023-07-31 DIAGNOSIS — M546 Pain in thoracic spine: Secondary | ICD-10-CM | POA: Diagnosis not present

## 2023-07-31 DIAGNOSIS — M9901 Segmental and somatic dysfunction of cervical region: Secondary | ICD-10-CM | POA: Diagnosis not present

## 2023-07-31 DIAGNOSIS — M4802 Spinal stenosis, cervical region: Secondary | ICD-10-CM | POA: Diagnosis not present

## 2023-07-31 DIAGNOSIS — M47816 Spondylosis without myelopathy or radiculopathy, lumbar region: Secondary | ICD-10-CM | POA: Diagnosis not present

## 2023-07-31 DIAGNOSIS — M9902 Segmental and somatic dysfunction of thoracic region: Secondary | ICD-10-CM | POA: Diagnosis not present

## 2023-08-15 DIAGNOSIS — L03031 Cellulitis of right toe: Secondary | ICD-10-CM | POA: Diagnosis not present

## 2023-08-15 DIAGNOSIS — M79674 Pain in right toe(s): Secondary | ICD-10-CM | POA: Diagnosis not present

## 2023-08-26 ENCOUNTER — Other Ambulatory Visit: Payer: Self-pay | Admitting: Family Medicine

## 2023-08-28 DIAGNOSIS — M961 Postlaminectomy syndrome, not elsewhere classified: Secondary | ICD-10-CM | POA: Diagnosis not present

## 2023-08-28 DIAGNOSIS — Z9689 Presence of other specified functional implants: Secondary | ICD-10-CM | POA: Diagnosis not present

## 2023-09-04 ENCOUNTER — Other Ambulatory Visit: Payer: Self-pay | Admitting: Family Medicine

## 2023-09-04 DIAGNOSIS — I10 Essential (primary) hypertension: Secondary | ICD-10-CM

## 2023-09-04 NOTE — Telephone Encounter (Signed)
Needs office follow up.  Refilled once.  Kristian Covey MD [facility]

## 2023-10-07 ENCOUNTER — Other Ambulatory Visit: Payer: Self-pay | Admitting: Family Medicine

## 2023-10-07 DIAGNOSIS — R0982 Postnasal drip: Secondary | ICD-10-CM

## 2023-11-22 ENCOUNTER — Encounter: Payer: Self-pay | Admitting: Family Medicine

## 2023-11-22 ENCOUNTER — Ambulatory Visit: Admitting: Family Medicine

## 2023-11-22 VITALS — BP 132/72 | HR 91 | Temp 97.7°F | Wt 281.3 lb

## 2023-11-22 DIAGNOSIS — N189 Chronic kidney disease, unspecified: Secondary | ICD-10-CM | POA: Diagnosis not present

## 2023-11-22 DIAGNOSIS — E785 Hyperlipidemia, unspecified: Secondary | ICD-10-CM

## 2023-11-22 DIAGNOSIS — I10 Essential (primary) hypertension: Secondary | ICD-10-CM

## 2023-11-22 DIAGNOSIS — Z6841 Body Mass Index (BMI) 40.0 and over, adult: Secondary | ICD-10-CM | POA: Diagnosis not present

## 2023-11-22 DIAGNOSIS — R739 Hyperglycemia, unspecified: Secondary | ICD-10-CM

## 2023-11-22 LAB — COMPREHENSIVE METABOLIC PANEL WITH GFR
ALT: 22 U/L (ref 0–35)
AST: 30 U/L (ref 0–37)
Albumin: 4.3 g/dL (ref 3.5–5.2)
Alkaline Phosphatase: 83 U/L (ref 39–117)
BUN: 12 mg/dL (ref 6–23)
CO2: 28 meq/L (ref 19–32)
Calcium: 9.2 mg/dL (ref 8.4–10.5)
Chloride: 101 meq/L (ref 96–112)
Creatinine, Ser: 1.12 mg/dL (ref 0.40–1.20)
GFR: 52.12 mL/min — ABNORMAL LOW (ref >60.00)
Glucose, Bld: 98 mg/dL (ref 70–99)
Potassium: 3.7 meq/L (ref 3.5–5.1)
Sodium: 138 meq/L (ref 135–145)
Total Bilirubin: 0.5 mg/dL (ref 0.2–1.2)
Total Protein: 7.8 g/dL (ref 6.0–8.3)

## 2023-11-22 LAB — LIPID PANEL
Cholesterol: 175 mg/dL (ref 0–200)
HDL: 54.2 mg/dL (ref >39.00)
LDL Cholesterol: 87 mg/dL (ref 0–99)
NonHDL: 120.79
Total CHOL/HDL Ratio: 3
Triglycerides: 170 mg/dL — ABNORMAL HIGH (ref 0.0–149.0)
VLDL: 34 mg/dL (ref 0.0–40.0)

## 2023-11-22 LAB — HEMOGLOBIN A1C: Hgb A1c MFr Bld: 6.4 % (ref 4.6–6.5)

## 2023-11-22 NOTE — Patient Instructions (Signed)
 We will call with labs and will be considering GLP-1 medication.

## 2023-11-22 NOTE — Progress Notes (Signed)
 Established Patient Office Visit  Subjective   Patient ID: Taylor Ritter, female    DOB: 1960/12/21  Age: 63 y.o. MRN: [id number]  Chief Complaint  Patient presents with   Medical Management of Chronic Issues    HPI   Taylor Ritter is here with concerns of a recent elevated blood pressure and to address other multiple issues as below  She has history of hypertension and currently takes HCTZ 25 mg daily.  She had lost considerable amount of weight a year ago and blood pressures were better.  She went to the dentist yesterday and had a reading 158/98.  She is also recently had some more frequent headaches and wonders if this may be related.  Does not currently have home blood pressure cuff.  She is doing a lot of stress issues.  She had a granddaughter that died last 2024/02/02 related to complications postsurgery.  Having difficulties dealing with that.  She has morbid obesity with BMI over 46.  Has struggled to lose weight on her own.  She notes some this is probably related to stress eating.  Has tried a lower glycemic diet and lower calorie diets in the past without much success.  She does have history of hyperglycemia.  Last A1c last summer was 5.8 but she had lost some weight.  She does have diabetes range A1c in the past with A1c of 6.7% back in 2023.  No polyuria or polydipsia.  She has chronic kidney disease with GFR baseline around 50.  Needs follow-up labs for that.  Past Medical History:  Diagnosis Date   Allergy    takes Allegra daily and uses nasal spray daily.Takes Benadryl  as needed.Uses eye drops daily   Anemia    B 12 shots monthly   Anxiety    takes Xanax  as needed   Arthritis    osteoporosis   Asthma    Proventil  inhaler as needed   Chronic back pain    DDD and stenosis   Complication of anesthesia    pt states she was woke up during surgery   Depression    takes Wellbutrin  daily   Fibromyalgia    GERD (gastroesophageal reflux disease)    takes Nexium  daily    Headache(784.0)    takes Topamax  daily as needed   History of bronchitis    2016   History of colon polyps    benign   History of vertigo    took Meclizine  several yrs ago   Hypertension    followed by [facility]   IBS (irritable bowel syndrome)    takes LInzess daily   Insomnia    Joint pain    Muscle spasm    takes Flexeril  daily as needed   Neuromuscular disorder (HCC)    numbness arms legs,feet    Nocturia    Osteoporosis    Pain    takes Keppra  and Topamax  daily   Peripheral edema    takes HCTZ daily   Sleep apnea    has a CPAP but doesn't wear often   Type 2 diabetes mellitus with hyperglycemia (HCC) 09/22/2021   Urinary frequency    Urinary incontinence    Urinary urgency    Past Surgical History:  Procedure Laterality Date   ABDOMINAL HYSTERECTOMY     ANTERIOR FUSION CERVICAL SPINE     BACK SURGERY  2012   bladder tacked     CARPAL TUNNEL RELEASE     R&L   CESAREAN SECTION  1983   CHOLECYSTECTOMY     COLONOSCOPY     COLONOSCOPY WITH PROPOFOL  N/A 10/06/2017   Procedure: COLONOSCOPY WITH PROPOFOL ;  Surgeon: Rollin Dover, MD;  Location: [facility];  Service: Endoscopy;  Laterality: N/A;   COLONOSCOPY WITH PROPOFOL  N/A 12/18/2020   Procedure: COLONOSCOPY WITH PROPOFOL ;  Surgeon: Rollin Dover, MD;  Location: [facility];  Service: Endoscopy;  Laterality: N/A;   ESOPHAGEAL DILATION     ESOPHAGOGASTRODUODENOSCOPY (EGD) WITH PROPOFOL  N/A 06/25/2021   Procedure: ESOPHAGOGASTRODUODENOSCOPY (EGD) WITH PROPOFOL ;  Surgeon: Rollin Dover, MD;  Location: [facility];  Service: Endoscopy;  Laterality: N/A;   KNEE ARTHROSCOPY     L KNEE   LUMBAR LAMINECTOMY     LUMBAR LAMINECTOMY/DECOMPRESSION MICRODISCECTOMY  02/25/2011   Procedure: LUMBAR LAMINECTOMY/DECOMPRESSION MICRODISCECTOMY;  Surgeon: Catalina CHRISTELLA Stains;  Location: [facility];  Service: Neurosurgery;  Laterality: N/A;  Lumbar Four-Five Microdiscectomy   LUMBAR WOUND DEBRIDEMENT  03/18/2011    Procedure: LUMBAR WOUND DEBRIDEMENT;  Surgeon: Catalina CHRISTELLA Stains;  Location: [facility];  Service: Neurosurgery;  Laterality: N/A;  Exploration of Lumbar wound   POLYPECTOMY  10/06/2017   Procedure: POLYPECTOMY;  Surgeon: Rollin Dover, MD;  Location: [facility];  Service: Endoscopy;;   POLYPECTOMY  12/18/2020   Procedure: POLYPECTOMY;  Surgeon: Rollin Dover, MD;  Location: [facility];  Service: Endoscopy;;   SAVORY DILATION N/A 06/25/2021   Procedure: HARLEY DILATION;  Surgeon: Rollin Dover, MD;  Location: [facility];  Service: Endoscopy;  Laterality: N/A;   SEPTOPLASTY  10+yrs ago   SPINAL CORD STIMULATOR INSERTION N/A 12/11/2015   Procedure: LUMBAR SPINAL CORD STIMULATOR INSERTION;  Surgeon: Deward Fabian, MD;  Location: [facility];  Service: Neurosurgery;  Laterality: N/A;    reports that she has never smoked. She has never been exposed to tobacco smoke. She has never used smokeless tobacco. She reports that she does not drink alcohol and does not use drugs. family history includes Arthritis in her father and mother; COPD in her mother; Cancer in her father, maternal grandfather, and maternal grandmother; Diabetes in her father, maternal grandfather, and mother; Early death in her brother; Heart disease in her father; Heart disease (age of onset: 20) in her mother; Hypertension in her mother; Kidney disease in her mother; Mental illness in her father and mother. Allergies  Allergen Reactions   Other Other (See Comments)    Nylon sutures had to remove to allow healing  Vicryl sutures - do not use   Percocet [Oxycodone -Acetaminophen ] Other (See Comments)    makes her crazy   Augmentin  [Amoxicillin -Pot Clavulanate] Other (See Comments)    Unknown reaction   Codeine Hives   Dye Fdc Red [Red Dye #40 (Allura Red)] Hives and Other (See Comments)    Scan dye, broke out in hives all over once with a neck scan   Morphine And Codeine Hives and Itching   Vesicare [Solifenacin Succinate]  Itching    Review of Systems  Constitutional:  Positive for malaise/fatigue.  Eyes:  Negative for blurred vision.  Respiratory:  Negative for shortness of breath.   Cardiovascular:  Negative for chest pain.  Neurological:  Positive for headaches. Negative for dizziness and weakness.  Psychiatric/Behavioral:  The patient is nervous/anxious.       Objective:     BP 132/72   Pulse 91   Temp 97.7 F (36.5 C) (Oral)   Wt 281 lb 4.8 oz (127.6 kg)   SpO2 94%   BMI 46.10 kg/m  BP Readings from Last 3  Encounters:  11/22/23 132/72  04/13/23 130/80  01/24/23 124/72   Wt Readings from Last 3 Encounters:  11/22/23 281 lb 4.8 oz (127.6 kg)  07/05/23 278 lb (126.1 kg)  04/13/23 278 lb (126.1 kg)      Physical Exam Vitals reviewed.  Constitutional:      General: She is not in acute distress.    Appearance: She is well-developed.  Eyes:     Pupils: Pupils are equal, round, and reactive to light.  Neck:     Thyroid : No thyromegaly.     Vascular: No JVD.  Cardiovascular:     Rate and Rhythm: Normal rate and regular rhythm.     Heart sounds:     No gallop.  Pulmonary:     Effort: Pulmonary effort is normal. No respiratory distress.     Breath sounds: Normal breath sounds. No wheezing or rales.  Musculoskeletal:     Cervical back: Neck supple.     Right lower leg: No edema.     Left lower leg: No edema.  Neurological:     Mental Status: She is alert.      No results found for any visits on 11/22/23.  Last CBC Lab Results  Component Value Date   WBC 9.4 09/10/2019   HGB 14.3 12/18/2020   HCT 42.0 12/18/2020   MCV 96.3 09/10/2019   MCH 30.9 09/10/2019   RDW 12.9 09/10/2019   PLT 265 09/10/2019   Last metabolic panel Lab Results  Component Value Date   GLUCOSE 106 (H) 09/02/2022   NA 136 09/02/2022   K 3.9 09/02/2022   CL 99 09/02/2022   CO2 25 09/02/2022   BUN 18 09/02/2022   CREATININE 1.18 09/02/2022   GFR 49.37 (L) 09/02/2022   CALCIUM 9.3 09/02/2022    PROT 8.0 09/02/2022   ALBUMIN 4.3 09/02/2022   LABGLOB 2.7 12/24/2018   AGRATIO 1.6 12/24/2018   BILITOT 0.5 09/02/2022   ALKPHOS 87 09/02/2022   AST 29 09/02/2022   ALT 19 09/02/2022   ANIONGAP 13 09/10/2019   Last lipids Lab Results  Component Value Date   CHOL 182 09/02/2022   HDL 53.50 09/02/2022   LDLCALC 100 (H) 09/02/2022   TRIG 141.0 09/02/2022   CHOLHDL 3 09/02/2022   Last hemoglobin A1c Lab Results  Component Value Date   HGBA1C 5.8 09/02/2022      The 10-year ASCVD risk score (Arnett DK, et al., 2019) is: 12.8%    Assessment & Plan:   #1 hypertension.  Labile readings with reading yesterday that was significantly elevated at dentist but somewhat improved today.  We did discuss nonpharmacologic management with recommendation to lose some weight and continue low-sodium diet.  We did discuss possible additional medication such as ARB or ACE but she is contemplating weight loss medications which should also help her blood pressure eventually.  Given her borderline elevated reading today we elected to wait on adding any additional medications at this point to her hydrochlorothiazide   #2 morbid obesity.  Patient struggled to lose weight with lower calorie diets and lower glycemic diets.  Will recheck A1c.  Consider possible GLP-1 medication use.  She has no contraindications such as history of pancreatitis or family history of medullary thyroid  cancer or MEN.  She has had recent weight gain probably related to stress eating.  #3 history of chronic kidney disease.  Avoid nonsteroidals.  Rechecking comprehensive metabolic panel  #4 history of mild hyperlipidemia with previous LDL most recently 100.  Recheck  lipids.  Goal LDL less than 70   Return in about 2 months (around 01/22/2024).    Wolm Scarlet, MD

## 2023-11-23 ENCOUNTER — Ambulatory Visit: Payer: Self-pay | Admitting: Family Medicine

## 2023-11-24 ENCOUNTER — Encounter: Payer: Self-pay | Admitting: Family Medicine

## 2023-11-24 MED ORDER — OZEMPIC (0.25 OR 0.5 MG/DOSE) 2 MG/3ML ~~LOC~~ SOPN: 0.2500 mg | PEN_INJECTOR | SUBCUTANEOUS | 0 refills | Status: DC

## 2023-11-24 NOTE — Telephone Encounter (Signed)
 Please see result note. Results reviewed with patient

## 2023-12-03 ENCOUNTER — Other Ambulatory Visit: Payer: Self-pay | Admitting: Family Medicine

## 2023-12-17 ENCOUNTER — Encounter: Payer: Self-pay | Admitting: Family Medicine

## 2023-12-18 MED ORDER — OZEMPIC (0.25 OR 0.5 MG/DOSE) 2 MG/3ML ~~LOC~~ SOPN: 0.5000 mg | PEN_INJECTOR | SUBCUTANEOUS | 0 refills | Status: DC

## 2023-12-19 ENCOUNTER — Other Ambulatory Visit (INDEPENDENT_AMBULATORY_CARE_PROVIDER_SITE_OTHER): Admitting: Pharmacist

## 2023-12-19 DIAGNOSIS — E1165 Type 2 diabetes mellitus with hyperglycemia: Secondary | ICD-10-CM

## 2023-12-19 NOTE — Progress Notes (Signed)
 Pharmacy Quality Measure Review  This patient is appearing on a report for being at risk of failing the Glycemic Status Assessment in Diabetes measure this calendar year.   Last documented A1c 6.4% on 11/22/23.   No action needed at this time.   Catie IVAR Centers, PharmD, [facility] Clinical Pharmacist [phone]

## 2023-12-22 NOTE — Progress Notes (Signed)
 [name]                                          MRN: [id number]   12/22/2023   The [facility] Quality Team Specialist reviewed this patient medical record for the purposes of chart review for care gap closure. The following were reviewed: chart review for care gap closure-kidney health evaluation for diabetes:eGFR  and uACR. Patient has appt 01/29/2024    [facility] Quality Team

## 2024-01-05 ENCOUNTER — Other Ambulatory Visit: Payer: Self-pay | Admitting: Family Medicine

## 2024-01-05 DIAGNOSIS — R0982 Postnasal drip: Secondary | ICD-10-CM

## 2024-01-12 ENCOUNTER — Other Ambulatory Visit: Payer: Self-pay | Admitting: Family Medicine

## 2024-01-21 ENCOUNTER — Other Ambulatory Visit: Payer: Self-pay | Admitting: Family Medicine

## 2024-01-21 ENCOUNTER — Encounter: Payer: Self-pay | Admitting: Family Medicine

## 2024-01-22 MED ORDER — OZEMPIC (0.25 OR 0.5 MG/DOSE) 2 MG/3ML ~~LOC~~ SOPN: 0.5000 mg | PEN_INJECTOR | SUBCUTANEOUS | 0 refills | Status: DC

## 2024-01-29 ENCOUNTER — Encounter: Payer: Self-pay | Admitting: Family Medicine

## 2024-01-29 ENCOUNTER — Ambulatory Visit: Admitting: Family Medicine

## 2024-01-29 DIAGNOSIS — Z6841 Body Mass Index (BMI) 40.0 and over, adult: Secondary | ICD-10-CM | POA: Diagnosis not present

## 2024-01-29 DIAGNOSIS — G4733 Obstructive sleep apnea (adult) (pediatric): Secondary | ICD-10-CM | POA: Insufficient documentation

## 2024-01-29 DIAGNOSIS — I10 Essential (primary) hypertension: Secondary | ICD-10-CM | POA: Diagnosis not present

## 2024-01-29 MED ORDER — SEMAGLUTIDE (1 MG/DOSE) 4 MG/3ML ~~LOC~~ SOPN: 1.0000 mg | PEN_INJECTOR | SUBCUTANEOUS | 3 refills | Status: DC

## 2024-01-29 NOTE — Progress Notes (Signed)
 Established Patient Office Visit  Subjective   Patient ID: Taylor Ritter, female    DOB: 1960-11-29  Age: 64 y.o. MRN: [id number]  Chief Complaint  Patient presents with   Medical Management of Chronic Issues    HPI    Taylor Ritter is seen for medical follow-up after recent initiation of Ozempic .  She is currently on 0.5 mg subcutaneous once weekly and tolerating well.  Her weight has already come down from 281 to 263 pounds, though she feels like her weight has plateaued some during the past few weeks.  Denies any nausea or other side effects.  She has other comorbidities including hypertension, obstructive sleep apnea, type 2 diabetes, degenerative arthritis.  Last A1c was 6.4% but she has been as high as 6.7% in the past.  She had had some borderline elevated blood pressures on HCTZ prior to losing the weight and blood pressure much improved today.  Overall, feels much better since losing some weight and has more energy to walk and be active.  Past Medical History:  Diagnosis Date   Allergy    takes Allegra daily and uses nasal spray daily.Takes Benadryl  as needed.Uses eye drops daily   Anemia    B 12 shots monthly   Anxiety    takes Xanax  as needed   Arthritis    osteoporosis   Asthma    Proventil  inhaler as needed   Chronic back pain    DDD and stenosis   Complication of anesthesia    pt states she was woke up during surgery   Depression    takes Wellbutrin  daily   Fibromyalgia    GERD (gastroesophageal reflux disease)    takes Nexium  daily   Headache(784.0)    takes Topamax  daily as needed   History of bronchitis    2016   History of colon polyps    benign   History of vertigo    took Meclizine  several yrs ago   Hypertension    followed by [facility]   IBS (irritable bowel syndrome)    takes LInzess daily   Insomnia    Joint pain    Muscle spasm    takes Flexeril  daily as needed   Neuromuscular disorder (HCC)    numbness arms  legs,feet    Nocturia    Osteoporosis    Pain    takes Keppra  and Topamax  daily   Peripheral edema    takes HCTZ daily   Sleep apnea    has a CPAP but doesn't wear often   Type 2 diabetes mellitus with hyperglycemia (HCC) 09/22/2021   Urinary frequency    Urinary incontinence    Urinary urgency    Past Surgical History:  Procedure Laterality Date   ABDOMINAL HYSTERECTOMY     ANTERIOR FUSION CERVICAL SPINE     BACK SURGERY  2012   bladder tacked     CARPAL TUNNEL RELEASE     R&L   CESAREAN SECTION  1983   CHOLECYSTECTOMY     COLONOSCOPY     COLONOSCOPY WITH PROPOFOL  N/A 10/06/2017   Procedure: COLONOSCOPY WITH PROPOFOL ;  Surgeon: Rollin Dover, MD;  Location: [facility];  Service: Endoscopy;  Laterality: N/A;   COLONOSCOPY WITH PROPOFOL  N/A 12/18/2020   Procedure: COLONOSCOPY WITH PROPOFOL ;  Surgeon: Rollin Dover, MD;  Location: [facility];  Service: Endoscopy;  Laterality: N/A;   ESOPHAGEAL DILATION     ESOPHAGOGASTRODUODENOSCOPY (EGD) WITH PROPOFOL  N/A 06/25/2021   Procedure: ESOPHAGOGASTRODUODENOSCOPY (EGD) WITH PROPOFOL ;  Surgeon: Rollin Dover, MD;  Location: [facility];  Service: Endoscopy;  Laterality: N/A;   KNEE ARTHROSCOPY     L KNEE   LUMBAR LAMINECTOMY     LUMBAR LAMINECTOMY/DECOMPRESSION MICRODISCECTOMY  02/25/2011   Procedure: LUMBAR LAMINECTOMY/DECOMPRESSION MICRODISCECTOMY;  Surgeon: Catalina CHRISTELLA Stains;  Location: [facility];  Service: Neurosurgery;  Laterality: N/A;  Lumbar Four-Five Microdiscectomy   LUMBAR WOUND DEBRIDEMENT  03/18/2011   Procedure: LUMBAR WOUND DEBRIDEMENT;  Surgeon: Catalina CHRISTELLA Stains;  Location: [facility];  Service: Neurosurgery;  Laterality: N/A;  Exploration of Lumbar wound   POLYPECTOMY  10/06/2017   Procedure: POLYPECTOMY;  Surgeon: Rollin Dover, MD;  Location: [facility];  Service: Endoscopy;;   POLYPECTOMY  12/18/2020   Procedure: POLYPECTOMY;  Surgeon: Rollin Dover, MD;  Location: [facility];  Service: Endoscopy;;   SAVORY  DILATION N/A 06/25/2021   Procedure: HARLEY DILATION;  Surgeon: Rollin Dover, MD;  Location: [facility];  Service: Endoscopy;  Laterality: N/A;   SEPTOPLASTY  10+yrs ago   SPINAL CORD STIMULATOR INSERTION N/A 12/11/2015   Procedure: LUMBAR SPINAL CORD STIMULATOR INSERTION;  Surgeon: Deward Fabian, MD;  Location: [facility];  Service: Neurosurgery;  Laterality: N/A;    reports that she has never smoked. She has never been exposed to tobacco smoke. She has never used smokeless tobacco. She reports that she does not drink alcohol and does not use drugs. family history includes Arthritis in her father and mother; COPD in her mother; Cancer in her father, maternal grandfather, and maternal grandmother; Diabetes in her father, maternal grandfather, and mother; Early death in her brother; Heart disease in her father; Heart disease (age of onset: 13) in her mother; Hypertension in her mother; Kidney disease in her mother; Mental illness in her father and mother. Allergies  Allergen Reactions   Other Other (See Comments)    Nylon sutures had to remove to allow healing  Vicryl sutures - do not use   Percocet [Oxycodone -Acetaminophen ] Other (See Comments)    makes her crazy   Augmentin  [Amoxicillin -Pot Clavulanate] Other (See Comments)    Unknown reaction   Codeine Hives   Dye Fdc Red [Red Dye #40 (Allura Red)] Hives and Other (See Comments)    Scan dye, broke out in hives all over once with a neck scan   Morphine And Codeine Hives and Itching   Vesicare [Solifenacin Succinate] Itching    Review of Systems  Constitutional:  Negative for malaise/fatigue.  Eyes:  Negative for blurred vision.  Respiratory:  Negative for shortness of breath.   Cardiovascular:  Negative for chest pain.  Gastrointestinal:  Negative for abdominal pain, nausea and vomiting.  Neurological:  Negative for dizziness, weakness and headaches.      Objective:     BP 120/70   Pulse 83   Temp 98.2 F (36.8 C) (Oral)    Wt 263 lb 11.2 oz (119.6 kg)   SpO2 96%   BMI 43.21 kg/m  BP Readings from Last 3 Encounters:  01/29/24 120/70  11/22/23 132/72  04/13/23 130/80   Wt Readings from Last 3 Encounters:  01/29/24 263 lb 11.2 oz (119.6 kg)  11/22/23 281 lb 4.8 oz (127.6 kg)  07/05/23 278 lb (126.1 kg)      Physical Exam Vitals reviewed.  Constitutional:      General: She is not in acute distress.    Appearance: She is not ill-appearing.  Cardiovascular:     Rate and Rhythm: Normal rate and regular rhythm.  Pulmonary:  Effort: Pulmonary effort is normal.     Breath sounds: Normal breath sounds. No wheezing or rales.  Neurological:     Mental Status: She is alert.      No results found for any visits on 01/29/24.  Last CBC Lab Results  Component Value Date   WBC 9.4 09/10/2019   HGB 14.3 12/18/2020   HCT 42.0 12/18/2020   MCV 96.3 09/10/2019   MCH 30.9 09/10/2019   RDW 12.9 09/10/2019   PLT 265 09/10/2019   Last metabolic panel Lab Results  Component Value Date   GLUCOSE 98 11/22/2023   NA 138 11/22/2023   K 3.7 11/22/2023   CL 101 11/22/2023   CO2 28 11/22/2023   BUN 12 11/22/2023   CREATININE 1.12 11/22/2023   GFR 52.12 (L) 11/22/2023   CALCIUM 9.2 11/22/2023   PROT 7.8 11/22/2023   ALBUMIN 4.3 11/22/2023   LABGLOB 2.7 12/24/2018   AGRATIO 1.6 12/24/2018   BILITOT 0.5 11/22/2023   ALKPHOS 83 11/22/2023   AST 30 11/22/2023   ALT 22 11/22/2023   ANIONGAP 13 09/10/2019   Last lipids Lab Results  Component Value Date   CHOL 175 11/22/2023   HDL 54.20 11/22/2023   LDLCALC 87 11/22/2023   TRIG 170.0 (H) 11/22/2023   CHOLHDL 3 11/22/2023   Last hemoglobin A1c Lab Results  Component Value Date   HGBA1C 6.4 11/22/2023      The 10-year ASCVD risk score (Arnett DK, et al., 2019) is: 10.4%    Assessment & Plan:   Morbid obesity and history of type 2 diabetes.  Patient currently on Ozempic  0.5 mg subcutaneous weekly.  Tolerating well.  She has had good  success and has lost about 18 pounds thus far.  She feels like her weight has plateaued past few weeks and would like to consider further titration.  We discussed increasing her Ozempic  to 1 mg subcutaneous weekly with new prescription sent.  We have already seen some improvements in her blood pressure and hopefully she will see some improvements in her obstructive sleep apnea as well  Will plan on setting up 75-month follow-up.  Recheck A1c at that time.  If weight continues to come down may be able to discontinue her HCTZ at follow-up  Wolm Scarlet, MD

## 2024-01-30 MED ORDER — POTASSIUM CHLORIDE ER 10 MEQ PO TBCR: 20.0000 meq | EXTENDED_RELEASE_TABLET | Freq: Every day | ORAL | 1 refills | Status: AC

## 2024-01-30 MED ORDER — BUPROPION HCL ER (XL) 150 MG PO TB24: ORAL_TABLET | ORAL | 1 refills | Status: AC

## 2024-01-30 MED ORDER — ALPRAZOLAM 1 MG PO TABS: ORAL_TABLET | ORAL | 0 refills | Status: AC

## 2024-01-30 MED ORDER — FUROSEMIDE 20 MG PO TABS: ORAL_TABLET | ORAL | 1 refills | Status: AC

## 2024-01-30 MED ORDER — VALACYCLOVIR HCL 500 MG PO TABS: ORAL_TABLET | ORAL | 1 refills | Status: AC

## 2024-01-30 NOTE — Addendum Note (Signed)
 Addended by: METTA KRISTEN CROME on: 01/30/2024 07:58 AM   Modules accepted: Orders

## 2024-01-30 NOTE — Addendum Note (Signed)
 Addended by: MICHEAL WOLM ORN on: 01/30/2024 12:15 PM   Modules accepted: Orders

## 2024-02-18 ENCOUNTER — Other Ambulatory Visit: Payer: Self-pay | Admitting: Family Medicine

## 2024-02-26 ENCOUNTER — Encounter: Payer: Self-pay | Admitting: Family Medicine

## 2024-02-26 DIAGNOSIS — M961 Postlaminectomy syndrome, not elsewhere classified: Secondary | ICD-10-CM | POA: Diagnosis not present

## 2024-02-26 DIAGNOSIS — Z9689 Presence of other specified functional implants: Secondary | ICD-10-CM | POA: Diagnosis not present

## 2024-02-26 DIAGNOSIS — R519 Headache, unspecified: Secondary | ICD-10-CM | POA: Diagnosis not present

## 2024-02-26 MED ORDER — SEMAGLUTIDE (2 MG/DOSE) 8 MG/3ML ~~LOC~~ SOPN: 2.0000 mg | PEN_INJECTOR | SUBCUTANEOUS | 0 refills | Status: AC

## 2024-02-26 MED ORDER — SEMAGLUTIDE (2 MG/DOSE) 8 MG/3ML ~~LOC~~ SOPN: 2.0000 mg | PEN_INJECTOR | SUBCUTANEOUS | 0 refills | Status: DC

## 2024-02-26 NOTE — Addendum Note (Signed)
 Addended by: METTA KRISTEN CROME on: 02/26/2024 01:45 PM   Modules accepted: Orders

## 2024-04-07 ENCOUNTER — Other Ambulatory Visit: Payer: Self-pay | Admitting: Family Medicine

## 2024-04-07 DIAGNOSIS — I10 Essential (primary) hypertension: Secondary | ICD-10-CM

## 2024-04-18 ENCOUNTER — Emergency Department (HOSPITAL_BASED_OUTPATIENT_CLINIC_OR_DEPARTMENT_OTHER): Admitting: Radiology

## 2024-04-18 ENCOUNTER — Emergency Department (HOSPITAL_BASED_OUTPATIENT_CLINIC_OR_DEPARTMENT_OTHER)

## 2024-04-18 ENCOUNTER — Other Ambulatory Visit: Payer: Self-pay

## 2024-04-18 ENCOUNTER — Emergency Department (HOSPITAL_BASED_OUTPATIENT_CLINIC_OR_DEPARTMENT_OTHER)
Admission: EM | Admit: 2024-04-18 | Discharge: 2024-04-18 | Disposition: A | Discharge status: Home / Self Care | Attending: Emergency Medicine | Admitting: Emergency Medicine

## 2024-04-18 ENCOUNTER — Encounter (HOSPITAL_BASED_OUTPATIENT_CLINIC_OR_DEPARTMENT_OTHER): Payer: Self-pay

## 2024-04-18 DIAGNOSIS — W010XXA Fall on same level from slipping, tripping and stumbling without subsequent striking against object, initial encounter: Secondary | ICD-10-CM | POA: Insufficient documentation

## 2024-04-18 DIAGNOSIS — Z7984 Long term (current) use of oral hypoglycemic drugs: Secondary | ICD-10-CM | POA: Diagnosis not present

## 2024-04-18 DIAGNOSIS — S42222A 2-part displaced fracture of surgical neck of left humerus, initial encounter for closed fracture: Secondary | ICD-10-CM | POA: Diagnosis not present

## 2024-04-18 DIAGNOSIS — E119 Type 2 diabetes mellitus without complications: Secondary | ICD-10-CM | POA: Diagnosis not present

## 2024-04-18 DIAGNOSIS — M25532 Pain in left wrist: Secondary | ICD-10-CM | POA: Diagnosis not present

## 2024-04-18 DIAGNOSIS — S4992XA Unspecified injury of left shoulder and upper arm, initial encounter: Secondary | ICD-10-CM | POA: Diagnosis present

## 2024-04-18 DIAGNOSIS — I10 Essential (primary) hypertension: Secondary | ICD-10-CM | POA: Diagnosis not present

## 2024-04-18 DIAGNOSIS — Z79899 Other long term (current) drug therapy: Secondary | ICD-10-CM | POA: Insufficient documentation

## 2024-04-18 MED ORDER — KETOROLAC TROMETHAMINE 60 MG/2ML IM SOLN
30.0000 mg | Freq: Once | INTRAMUSCULAR | Status: AC
  Administered 2024-04-18: 30 mg via INTRAMUSCULAR
  Filled 2024-04-18: qty 2

## 2024-04-18 NOTE — ED Provider Notes (Signed)
 " [place] EMERGENCY DEPARTMENT AT [facility] Provider Note   CSN: [id number] Arrival date & time: 04/18/24  1706     Patient presents with: Fall and Arm Injury (L)   ANALEISE MCCLEERY is a 64 y.o. female.   Patient is a 64 year old female with a history of hypertension, fibromyalgia, GERD, chronic pain with multiple back surgeries and diabetes on Ozempic  who is presenting today after a fall.  She had taken her dogs in the backyard and they started running off.  She ventured into the yard when she slipped and fell over on her left side.  She was not able to get up on her own and family helped her up.  After getting up she was able to walk and denies any pain in her legs but is having severe pain in her left arm.  She did not hit her head or lose consciousness.  She does not take any blood thinners.  She denies any numbness or tingling in her left hand.  She has chronic pain in her neck and back but reports they are no different than her baseline.  The history is provided by the patient.  Fall  Arm Injury      Prior to Admission medications  Medication Sig Start Date End Date Taking? Authorizing Provider  albuterol  (VENTOLIN  HFA) 108 (90 Base) MCG/ACT inhaler Inhale 1-2 puffs into the lungs every 6 (six) hours as needed for wheezing or shortness of breath. 07/05/22   Williamson, Joanna R, NP  ALPRAZolam  (XANAX ) 1 MG tablet Take one tablet by mouth daily as needed for severe anxiety. 01/30/24   Burchette, Wolm ORN, MD  BIOTIN PO Take by mouth.    [provider]  buPROPion  (WELLBUTRIN  XL) 150 MG 24 hr tablet TAKE 3 TABLETS BY MOUTH DAILY. PLEASE SCHEDULE A PHYSICAL FOR MORE REFILLS. THANKS 01/30/24   Burchette, Wolm ORN, MD  Calcium-Magnesium -Vitamin D  (CALCIUM MAGNESIUM  PO) Take by mouth.    [provider]  Cetirizine HCl (ZYRTEC ALLERGY PO) Take by mouth.    [provider]  CINNAMON PO Take by mouth.    [provider]  cyanocobalamin   (VITAMIN B12) 1000 MCG/ML injection Inject 1 mL (1,000 mcg total) into the muscle every 30 (thirty) days. 06/12/23   Burchette, Wolm ORN, MD  cyclobenzaprine  (FLEXERIL ) 5 MG tablet Take 5 mg by mouth 2 (two) times daily as needed for muscle spasms.    [provider]  diphenhydrAMINE  (BENADRYL ) 50 MG capsule Take 50 mg by mouth every 6 (six) hours as needed for allergies.    [provider]  Estradiol  10 MCG TABS vaginal tablet Place 1 tablet (10 mcg total) vaginally 2 (two) times a week. 09/05/22   Burchette, Wolm ORN, MD  fluticasone  (FLONASE ) 50 MCG/ACT nasal spray PLACE 1 SPRAY INTO BOTH NOSTRILS DAILY AS NEEDED FOR ALLERGIES. 01/05/24   Burchette, Wolm ORN, MD  furosemide  (LASIX ) 20 MG tablet TAKE ONE TABLET BY MOUTH DAILY AS NEEDED FOR EDEMA 01/30/24   Burchette, Wolm ORN, MD  GABAPENTIN PO Take by mouth. Take 2 tablets at bedtime    [provider]  hydrochlorothiazide  (HYDRODIURIL ) 25 MG tablet TAKE 1 TABLET (25 MG TOTAL) BY MOUTH DAILY. 04/08/24   Burchette, Wolm ORN, MD  ibuprofen (ADVIL) 200 MG tablet Take 800 mg by mouth every 8 (eight) hours as needed (for pain.).    [provider]  ipratropium (ATROVENT ) 0.03 % nasal spray Place 2 sprays into both nostrils every 12 (twelve)  hours. 07/06/22   Kara Dorn NOVAK, MD  levETIRAcetam  (KEPPRA ) 500 MG tablet Take 2 tablets (1,000 mg total) by mouth at bedtime. 07/05/22   Webb, Padonda B, FNP  linaclotide (LINZESS) 290 MCG CAPS capsule Take 580 mcg by mouth in the morning.    [provider]  montelukast  (SINGULAIR ) 10 MG tablet TAKE 1 TABLET BY MOUTH EVERYDAY AT BEDTIME 01/12/24   Burchette, Wolm ORN, MD  Multiple Vitamin (MULTIVITAMIN WITH MINERALS) TABS tablet Take 1 tablet by mouth daily. One A Day for Women    [provider]  nystatin  (MYCOSTATIN /NYSTOP ) powder Apply topically 4 (four) times daily. 01/05/22   Burchette, Wolm ORN, MD  nystatin  ointment (MYCOSTATIN ) APPLY 1 APPLICATION TOPICALLY 2  (TWO) TIMES DAILY AS NEEDED 10/18/21   Jertson, Jill Evelyn, MD  pantoprazole  (PROTONIX ) 40 MG tablet Take 40 mg by mouth daily.    [provider]  pilocarpine (SALAGEN) 5 MG tablet Take 5 mg by mouth 2 (two) times daily as needed (dryness).    [provider]  potassium chloride  (KLOR-CON ) 10 MEQ tablet Take 2 tablets (20 mEq total) by mouth daily. 01/30/24   Burchette, Wolm ORN, MD  Semaglutide , 2 MG/DOSE, 8 MG/3ML SOPN Inject 2 mg into the skin once a week. 02/26/24   Burchette, Wolm ORN, MD  tiZANidine  (ZANAFLEX ) 4 MG tablet Take 1 tablet by mouth 2 (two) times daily as needed. 03/23/23   [provider]  topiramate  (TOPAMAX ) 100 MG tablet Take 2 tablets (200 mg total) by mouth at bedtime. 07/05/22   Webb, Padonda B, FNP  valACYclovir  (VALTREX ) 500 MG tablet TAKE 1 TAB DAILY, MAY TAKE ADDITIONAL 1 TAB IF NEEDED FOR OUTBREAK. NEED EXAM WITH LABS FOR REFILLS 01/30/24   Burchette, Wolm ORN, MD  Vilazodone  HCl (VIIBRYD ) 40 MG TABS TAKE 1 TABLET BY MOUTH EVERY DAY 02/18/24   Burchette, Wolm ORN, MD  vitamin B-12 (CYANOCOBALAMIN ) 1000 MCG tablet Take 1,000 mcg by mouth in the morning.    [provider]  VITAMIN D  PO Take by mouth.    [provider]    Allergies: Other, Percocet [oxycodone -acetaminophen ], Augmentin  [amoxicillin -pot clavulanate], Codeine, Dye fdc red [red dye #40 (allura red)], Morphine and codeine, and Vesicare [solifenacin succinate]    Review of Systems  Updated Vital Signs BP (!) 154/98 (BP Location: Right Arm)   Pulse 81   Temp 97.7 F (36.5 C)   Resp 18   SpO2 100%   Physical Exam Vitals and nursing note reviewed.  Constitutional:      General: She is not in acute distress.    Appearance: She is well-developed.  HENT:     Head: Normocephalic and atraumatic.  Eyes:     Pupils: Pupils are equal, round, and reactive to light.  Cardiovascular:     Rate and Rhythm: Normal rate and regular rhythm.     Pulses: Normal pulses.      Heart sounds: Normal heart sounds. No murmur heard.    No friction rub.  Pulmonary:     Effort: Pulmonary effort is normal.     Breath sounds: Normal breath sounds. No wheezing or rales.  Abdominal:     General: Bowel sounds are normal. There is no distension.     Palpations: Abdomen is soft.     Tenderness: There is no abdominal tenderness. There is no guarding or rebound.  Musculoskeletal:        General: Tenderness present.     Left shoulder: Tenderness present. No deformity.  Decreased range of motion.     Left elbow: Normal.     Left wrist: Tenderness and bony tenderness present. No snuff box tenderness.     Cervical back: Normal range of motion and neck supple. No tenderness.     Comments: No edema bony tenderness over the left distal ulna  Skin:    General: Skin is warm and dry.     Findings: No rash.  Neurological:     Mental Status: She is alert and oriented to person, place, and time. Mental status is at baseline.     Cranial Nerves: No cranial nerve deficit.  Psychiatric:        Behavior: Behavior normal.     (all labs ordered are listed, but only abnormal results are displayed) Labs Reviewed - No data to display  EKG: None  Radiology: DG Wrist Complete Left Result Date: 04/18/2024 EXAM: 3 VIEW(S) XRAY OF THE LEFT WRIST 04/18/2024 08:13:00 PM COMPARISON: None available. CLINICAL HISTORY: Wrist pain after fall. FINDINGS: BONES AND JOINTS: Mild degenerative changes in the STT (scaphotrapeziotrapezoid) and 1st carpometacarpal joints. No acute fracture or dislocation. No malalignment. SOFT TISSUES: Unremarkable. IMPRESSION: 1. No acute fracture or dislocation. Electronically signed by: Elsie Gravely MD 04/18/2024 08:19 PM EST RP Workstation: [id number]   DG Shoulder Left Result Date: 04/18/2024 CLINICAL DATA:  Fall and trauma to the left shoulder. EXAM: DG SHOULDER 2+V*L* COMPARISON:  None Available. FINDINGS: Comminuted fracture of the left humeral neck with proximal  migration and impaction of the humeral shaft. No dislocation. The bones are osteopenic. The soft tissues are unremarkable IMPRESSION: Comminuted fracture of the left humeral neck. Electronically Signed   By: Vanetta Chou M.D.   On: 04/18/2024 18:26     Procedures   Medications Ordered in the ED  ketorolac  (TORADOL ) injection 30 mg (has no administration in time range)                                    Medical Decision Making Amount and/or Complexity of Data Reviewed Radiology: ordered and independent interpretation performed. Decision-making details documented in ED Course.  Risk Prescription drug management.   Pt with multiple medical problems and comorbidities and presenting today with a complaint that caries a high risk for morbidity and mortality.  Here today after a fall.  Patient denies any head or neck injury or pain at this time.  Patient was able to ambulate.  She is neurovascularly intact but is having significant pain over her shoulder also with a little bit of pain in the left wrist.  No snuffbox tenderness or concern for scaphoid fracture at this time. I have independently visualized and interpreted pt's images today.  X-ray with a comminuted humerus fracture.  Radiology reports a comminuted fracture of the left humeral neck.  Wrist imaging is negative. She has multiple allergies to narcotic medication.  She will take Tylenol  and ibuprofen as needed for the pain.  She will follow-up with Dr. Mariea for ongoing care of her humerus fracture.       Final diagnoses:  Closed 2-part displaced fracture of surgical neck of left humerus, initial encounter    ED Discharge Orders     None          Doretha Folks, MD 04/18/24 2024  "

## 2024-04-18 NOTE — ED Triage Notes (Signed)
 Pt c/o mechanical fall on ice, L shoulder/ arm pain- I think I went down that way & rolled. Pt guarding L arm, sling applied in triage.  + distal pulses, good cap refill

## 2024-04-18 NOTE — Discharge Instructions (Addendum)
 You need to wear the sling anytime you are up moving around.  Do not lift anything with your left arm.  Take 2 Tylenol  and 2 ibuprofen together every 6 hours for pain.  You can also apply an ice pack.

## 2024-04-24 ENCOUNTER — Other Ambulatory Visit: Payer: Self-pay | Admitting: Physician Assistant

## 2024-04-24 ENCOUNTER — Encounter: Payer: Self-pay | Admitting: Physician Assistant

## 2024-04-24 ENCOUNTER — Encounter: Payer: Self-pay | Admitting: Family Medicine

## 2024-04-24 ENCOUNTER — Ambulatory Visit
Admission: RE | Admit: 2024-04-24 | Discharge: 2024-04-24 | Disposition: A | Source: Ambulatory Visit | Attending: Physician Assistant | Admitting: Physician Assistant

## 2024-04-24 ENCOUNTER — Ambulatory Visit: Admitting: Family Medicine

## 2024-04-24 VITALS — BP 106/64 | HR 89 | Temp 98.1°F | Wt 243.7 lb

## 2024-04-24 DIAGNOSIS — E1165 Type 2 diabetes mellitus with hyperglycemia: Secondary | ICD-10-CM

## 2024-04-24 DIAGNOSIS — Z01818 Encounter for other preprocedural examination: Secondary | ICD-10-CM

## 2024-04-24 DIAGNOSIS — E118 Type 2 diabetes mellitus with unspecified complications: Secondary | ICD-10-CM

## 2024-04-24 DIAGNOSIS — E669 Obesity, unspecified: Secondary | ICD-10-CM

## 2024-04-24 DIAGNOSIS — S42292A Other displaced fracture of upper end of left humerus, initial encounter for closed fracture: Secondary | ICD-10-CM

## 2024-04-24 DIAGNOSIS — S42215K Unspecified nondisplaced fracture of surgical neck of left humerus, subsequent encounter for fracture with nonunion: Secondary | ICD-10-CM

## 2024-04-24 DIAGNOSIS — I10 Essential (primary) hypertension: Secondary | ICD-10-CM

## 2024-04-24 LAB — COMPREHENSIVE METABOLIC PANEL WITH GFR
ALT: 16 U/L (ref 3–35)
AST: 21 U/L (ref 5–37)
Albumin: 4.3 g/dL (ref 3.5–5.2)
Alkaline Phosphatase: 85 U/L (ref 39–117)
BUN: 14 mg/dL (ref 6–23)
CO2: 27 meq/L (ref 19–32)
Calcium: 9.2 mg/dL (ref 8.4–10.5)
Chloride: 102 meq/L (ref 96–112)
Creatinine, Ser: 1.09 mg/dL (ref 0.40–1.20)
GFR: 53.68 mL/min — ABNORMAL LOW
Glucose, Bld: 93 mg/dL (ref 70–99)
Potassium: 3.9 meq/L (ref 3.5–5.1)
Sodium: 139 meq/L (ref 135–145)
Total Bilirubin: 0.4 mg/dL (ref 0.2–1.2)
Total Protein: 7.3 g/dL (ref 6.0–8.3)

## 2024-04-24 LAB — CBC WITH DIFFERENTIAL/PLATELET
Basophils Absolute: 0 10*3/uL (ref 0.0–0.1)
Basophils Relative: 0.8 % (ref 0.0–3.0)
Eosinophils Absolute: 0.3 10*3/uL (ref 0.0–0.7)
Eosinophils Relative: 4.4 % (ref 0.0–5.0)
HCT: 38.2 % (ref 36.0–46.0)
Hemoglobin: 12.8 g/dL (ref 12.0–15.0)
Lymphocytes Relative: 30.2 % (ref 12.0–46.0)
Lymphs Abs: 1.8 10*3/uL (ref 0.7–4.0)
MCHC: 33.6 g/dL (ref 30.0–36.0)
MCV: 90.7 fl (ref 78.0–100.0)
Monocytes Absolute: 0.4 10*3/uL (ref 0.1–1.0)
Monocytes Relative: 6.9 % (ref 3.0–12.0)
Neutro Abs: 3.5 10*3/uL (ref 1.4–7.7)
Neutrophils Relative %: 57.7 % (ref 43.0–77.0)
Platelets: 211 10*3/uL (ref 150.0–400.0)
RBC: 4.21 Mil/uL (ref 3.87–5.11)
RDW: 13.6 % (ref 11.5–15.5)
WBC: 6 10*3/uL (ref 4.0–10.5)

## 2024-04-24 LAB — POCT GLYCOSYLATED HEMOGLOBIN (HGB A1C): Hemoglobin A1C: 5.5 % (ref 4.0–5.6)

## 2024-04-24 NOTE — Progress Notes (Signed)
 "  Established Patient Office Visit  Subjective   Patient ID: Taylor Ritter, female    DOB: 02/18/1961  Age: 64 y.o. MRN: [id number]  Chief Complaint  Patient presents with   Medical Management of Chronic Issues    HPI    Taylor Ritter is here today for routine medical follow-up.  She unfortunately had a slip on the ice last week and had complicated fracture left humerus involving humerus and neck.  This was a comminuted fracture and will require surgery which has been scheduled for this Friday.  Her other medical problems include history of hypertension, asthma, obstructive sleep apnea, GERD, type 2 diabetes.  She is currently on Ozempic  2 mg once weekly and has continued to steadily lose weight.  She has lost over 40 pounds thus far.  Feels better overall.  Blood pressures have also improved.  She takes HCTZ.  No recent dizziness or lightheadedness.  A1c today is excellent at 5.5.  She has no cardiac history and low surgical risk for orthopedic surgery.  Denies any recent chest pains.  No pleuritic pain.  No history of thromboembolism.  GERD controlled with pantoprazole  40 mg daily.  Past Medical History:  Diagnosis Date   Allergy    takes Allegra daily and uses nasal spray daily.Takes Benadryl  as needed.Uses eye drops daily   Anemia    B 12 shots monthly   Anxiety    takes Xanax  as needed   Arthritis    osteoporosis   Asthma    Proventil  inhaler as needed   Chronic back pain    DDD and stenosis   Complication of anesthesia    pt states she was woke up during surgery   Depression    takes Wellbutrin  daily   Fibromyalgia    GERD (gastroesophageal reflux disease)    takes Nexium  daily   Headache(784.0)    takes Topamax  daily as needed   History of bronchitis    2016   History of colon polyps    benign   History of vertigo    took Meclizine  several yrs ago   Hypertension    followed by [facility]   IBS (irritable bowel syndrome)    takes LInzess   daily   Insomnia    Joint pain    Muscle spasm    takes Flexeril  daily as needed   Neuromuscular disorder (HCC)    numbness arms legs,feet    Nocturia    Osteoporosis    Pain    takes Keppra  and Topamax  daily   Peripheral edema    takes HCTZ daily   Sleep apnea    has a CPAP but doesn't wear often   Type 2 diabetes mellitus with hyperglycemia (HCC) 09/22/2021   Urinary frequency    Urinary incontinence    Urinary urgency    Past Surgical History:  Procedure Laterality Date   ABDOMINAL HYSTERECTOMY     ANTERIOR FUSION CERVICAL SPINE     BACK SURGERY  2012   bladder tacked     CARPAL TUNNEL RELEASE     R&L   CESAREAN SECTION  1983   CHOLECYSTECTOMY     COLONOSCOPY     COLONOSCOPY WITH PROPOFOL  N/A 10/06/2017   Procedure: COLONOSCOPY WITH PROPOFOL ;  Surgeon: Rollin Dover, MD;  Location: [facility];  Service: Endoscopy;  Laterality: N/A;   COLONOSCOPY WITH PROPOFOL  N/A 12/18/2020   Procedure: COLONOSCOPY WITH PROPOFOL ;  Surgeon: Rollin Dover, MD;  Location: [facility];  Service: Endoscopy;  Laterality: N/A;   ESOPHAGEAL DILATION     ESOPHAGOGASTRODUODENOSCOPY (EGD) WITH PROPOFOL  N/A 06/25/2021   Procedure: ESOPHAGOGASTRODUODENOSCOPY (EGD) WITH PROPOFOL ;  Surgeon: Rollin Dover, MD;  Location: [facility];  Service: Endoscopy;  Laterality: N/A;   KNEE ARTHROSCOPY     L KNEE   LUMBAR LAMINECTOMY     LUMBAR LAMINECTOMY/DECOMPRESSION MICRODISCECTOMY  02/25/2011   Procedure: LUMBAR LAMINECTOMY/DECOMPRESSION MICRODISCECTOMY;  Surgeon: Catalina CHRISTELLA Stains;  Location: [facility];  Service: Neurosurgery;  Laterality: N/A;  Lumbar Four-Five Microdiscectomy   LUMBAR WOUND DEBRIDEMENT  03/18/2011   Procedure: LUMBAR WOUND DEBRIDEMENT;  Surgeon: Catalina CHRISTELLA Stains;  Location: [facility];  Service: Neurosurgery;  Laterality: N/A;  Exploration of Lumbar wound   POLYPECTOMY  10/06/2017   Procedure: POLYPECTOMY;  Surgeon: Rollin Dover, MD;  Location: [facility];  Service: Endoscopy;;    POLYPECTOMY  12/18/2020   Procedure: POLYPECTOMY;  Surgeon: Rollin Dover, MD;  Location: [facility];  Service: Endoscopy;;   SAVORY DILATION N/A 06/25/2021   Procedure: HARLEY DILATION;  Surgeon: Rollin Dover, MD;  Location: [facility];  Service: Endoscopy;  Laterality: N/A;   SEPTOPLASTY  10+yrs ago   SPINAL CORD STIMULATOR INSERTION N/A 12/11/2015   Procedure: LUMBAR SPINAL CORD STIMULATOR INSERTION;  Surgeon: Deward Fabian, MD;  Location: [facility];  Service: Neurosurgery;  Laterality: N/A;    reports that she has never smoked. She has never been exposed to tobacco smoke. She has never used smokeless tobacco. She reports that she does not drink alcohol and does not use drugs. family history includes Arthritis in her father and mother; COPD in her mother; Cancer in her father, maternal grandfather, and maternal grandmother; Diabetes in her father, maternal grandfather, and mother; Early death in her brother; Heart disease in her father; Heart disease (age of onset: 26) in her mother; Hypertension in her mother; Kidney disease in her mother; Mental illness in her father and mother. Allergies[1]  Review of Systems  Constitutional:  Negative for fever and malaise/fatigue.  Eyes:  Negative for blurred vision.  Respiratory:  Negative for shortness of breath.   Cardiovascular:  Negative for chest pain.  Gastrointestinal:  Negative for abdominal pain.  Neurological:  Negative for dizziness, weakness and headaches.      Objective:     BP 106/64   Pulse 89   Temp 98.1 F (36.7 C) (Oral)   Wt 243 lb 11.2 oz (110.5 kg)   SpO2 95%   BMI 39.94 kg/m  BP Readings from Last 3 Encounters:  04/24/24 106/64  04/18/24 (!) 154/98  01/29/24 120/70   Wt Readings from Last 3 Encounters:  04/24/24 243 lb 11.2 oz (110.5 kg)  01/29/24 263 lb 11.2 oz (119.6 kg)  11/22/23 281 lb 4.8 oz (127.6 kg)      Physical Exam Vitals reviewed.  Constitutional:      General: She is not in acute  distress.    Appearance: She is not ill-appearing.  Cardiovascular:     Rate and Rhythm: Normal rate and regular rhythm.  Pulmonary:     Effort: Pulmonary effort is normal.     Breath sounds: Normal breath sounds. No wheezing or rales.  Musculoskeletal:     Comments: Left shoulder and left upper extremity in sling from recent injury.  Neurological:     Mental Status: She is alert.      Results for orders placed or performed in visit on 04/24/24  POC HgB A1c  Result Value Ref Range   Hemoglobin A1C 5.5  4.0 - 5.6 %   HbA1c POC (<> result, manual entry)     HbA1c, POC (prediabetic range)     HbA1c, POC (controlled diabetic range)      Last CBC Lab Results  Component Value Date   WBC 9.4 09/10/2019   HGB 14.3 12/18/2020   HCT 42.0 12/18/2020   MCV 96.3 09/10/2019   MCH 30.9 09/10/2019   RDW 12.9 09/10/2019   PLT 265 09/10/2019   Last metabolic panel Lab Results  Component Value Date   GLUCOSE 98 11/22/2023   NA 138 11/22/2023   K 3.7 11/22/2023   CL 101 11/22/2023   CO2 28 11/22/2023   BUN 12 11/22/2023   CREATININE 1.12 11/22/2023   GFR 52.12 (L) 11/22/2023   CALCIUM 9.2 11/22/2023   PROT 7.8 11/22/2023   ALBUMIN 4.3 11/22/2023   LABGLOB 2.7 12/24/2018   AGRATIO 1.6 12/24/2018   BILITOT 0.5 11/22/2023   ALKPHOS 83 11/22/2023   AST 30 11/22/2023   ALT 22 11/22/2023   ANIONGAP 13 09/10/2019   Last hemoglobin A1c Lab Results  Component Value Date   HGBA1C 5.5 04/24/2024      The 10-year ASCVD risk score (Arnett DK, et al., 2019) is: 8.2%    Assessment & Plan:   #1 type 2 diabetes with improved control with A1c today 5.5%.  Continue Ozempic  2 mg subcutaneous once weekly.  She is currently tolerating well.  Need to check urine microalbumin yearly and continue yearly diabetic eye exam  #2 hypertension stable and well-controlled on HCTZ.  Check competence of metabolic panel.  Continue high potassium diet.  Continue low-sodium with goal less than 2500 mg  daily.  We did discuss option of discontinuing HCTZ but she is reluctant at this time.  Follow-up for any dizziness or other concerns  #3 recent comminuted fracture left humerus neck.  Patient scheduled for surgery this Friday.  Will check CBC and CMP.  She has no known contraindications.  #4 obesity with BMI 39 with co-morbidities of hypertension and type 2 diabetes.  Do well on Ozempic  with weight loss > 40 pounds thus far.    No follow-ups on file.    Wolm Scarlet, MD     [1]  Allergies Allergen Reactions   Other Other (See Comments)    Nylon sutures had to remove to allow healing  Vicryl sutures - do not use   Percocet [Oxycodone -Acetaminophen ] Other (See Comments)    makes her crazy   Augmentin  [Amoxicillin -Pot Clavulanate] Other (See Comments)    Unknown reaction   Codeine Hives   Dye Fdc Red [Red Dye #40 (Allura Red)] Hives and Other (See Comments)    Scan dye, broke out in hives all over once with a neck scan   Morphine  And Codeine Hives and Itching   Vesicare [Solifenacin Succinate] Itching   "

## 2024-04-24 NOTE — Progress Notes (Signed)
 Sent message, via epic in basket, requesting orders in epic from Careers adviser.

## 2024-04-24 NOTE — Progress Notes (Signed)
 For Anesthesia: PCP - Wolm LELON Scarlet, MD last office visit 04/24/24 in [facility] Cardiologist - N/A  Bowel Prep reminder:N/A  Chest x-ray - greater than 1 year EKG - greater than 1 year Stress Test - N/A ECHO - N/A Cardiac Cath - N/A Pacemaker/ICD device last checked: N/A Pacemaker orders received: N/A Device Rep notified:  Spinal Cord Stimulator: Yes  Sleep Study - 05/14/15 CPAP - no longer needed  Fasting Blood Sugar - N/A Checks Blood Sugar __as needed Date and result of last Hgb A1c- 04/24/24 5.5 in [facility]  Last dose of GLP1 agonist- Semaglutide  GLP1 instructions: Hold 7 days prior to schedule (Hold 24 hours-daily) Do not take after 04/19/24   Last dose of SGLT-2 inhibitors- N/A SGLT-2 instructions: Hold 72 hours prior to surgery  Blood Thinner Instructions:N/A Last Dose:N/A Time last taken:N/A  Aspirin Instructions:N/A Last Dose:N/A Time last taken:N/A  Activity level:  Able to exercise without chest pain and/or shortness of breath     Anesthesia review: DM, HTN,   Patient denies shortness of breath, fever, cough and chest pain at PAT appointment   Patient verbalized understanding of instructions that were reviewed over the telephone.

## 2024-04-25 ENCOUNTER — Ambulatory Visit: Payer: Self-pay | Admitting: Family Medicine

## 2024-04-25 ENCOUNTER — Encounter: Payer: Self-pay | Admitting: Family Medicine

## 2024-04-25 ENCOUNTER — Other Ambulatory Visit: Payer: Self-pay

## 2024-04-25 ENCOUNTER — Encounter (HOSPITAL_COMMUNITY): Payer: Self-pay | Admitting: Orthopedic Surgery

## 2024-04-25 NOTE — H&P (Signed)
 " Patient's anticipated LOS is less than 2 midnights, meeting these requirements: - Younger than 8 - Lives within 1 hour of care - Has a competent adult at home to recover with post-op recover - NO history of  - Chronic pain requiring opiods  - Diabetes  - Coronary Artery Disease  - Heart failure  - Heart attack  - Stroke  - DVT/VTE  - Cardiac arrhythmia  - Respiratory Failure/COPD  - Renal failure  - Anemia  - Advanced Liver disease     Taylor Ritter is an 64 y.o. female.    Chief Complaint: left shoulder pain  HPI: Pt is a 64 y.o. female complaining of left shoulder pain after recent fall. Pain had continually increased since the beginning. X-rays in the clinic show displaced fracture of left proximal humerus.  Various options are discussed with the patient. Risks, benefits and expectations were discussed with the patient. Patient understand the risks, benefits and expectations and wishes to proceed with surgery.   PCP:  Micheal Wolm ORN, MD  D/C Plans: Home  PMH: Past Medical History:  Diagnosis Date   Allergy    takes Allegra daily and uses nasal spray daily.Takes Benadryl  as needed.Uses eye drops daily   Anemia    B 12 shots monthly   Anxiety    takes Xanax  as needed   Arthritis    osteoporosis   Asthma    Proventil  inhaler as needed   Chronic back pain    DDD and stenosis   Complication of anesthesia    pt states she was woke up during surgery   Depression    takes Wellbutrin  daily   Fibromyalgia    GERD (gastroesophageal reflux disease)    takes Nexium  daily   Headache(784.0)    takes Topamax  daily as needed   History of bronchitis    2016   History of colon polyps    benign   History of vertigo    took Meclizine  several yrs ago   Hypertension    followed by [facility]   IBS (irritable bowel syndrome)    takes LInzess  daily   Insomnia    Joint pain    Muscle spasm    takes Flexeril  daily as needed   Neuromuscular  disorder (HCC)    numbness arms legs,feet    Nocturia    Osteoporosis    Pain    takes Keppra  and Topamax  daily   Peripheral edema    takes HCTZ daily   Sleep apnea    has a CPAP but doesn't wear often   Type 2 diabetes mellitus with hyperglycemia (HCC) 09/22/2021   Urinary frequency    Urinary incontinence    Urinary urgency     PSH: Past Surgical History:  Procedure Laterality Date   ABDOMINAL HYSTERECTOMY     ANTERIOR FUSION CERVICAL SPINE     BACK SURGERY  2012   bladder tacked     CARPAL TUNNEL RELEASE     R&L   CESAREAN SECTION  1983   CHOLECYSTECTOMY     COLONOSCOPY     COLONOSCOPY WITH PROPOFOL  N/A 10/06/2017   Procedure: COLONOSCOPY WITH PROPOFOL ;  Surgeon: Rollin Dover, MD;  Location: [facility];  Service: Endoscopy;  Laterality: N/A;   COLONOSCOPY WITH PROPOFOL  N/A 12/18/2020   Procedure: COLONOSCOPY WITH PROPOFOL ;  Surgeon: Rollin Dover, MD;  Location: [facility];  Service: Endoscopy;  Laterality: N/A;   ESOPHAGEAL DILATION     ESOPHAGOGASTRODUODENOSCOPY (EGD) WITH PROPOFOL   N/A 06/25/2021   Procedure: ESOPHAGOGASTRODUODENOSCOPY (EGD) WITH PROPOFOL ;  Surgeon: Rollin Dover, MD;  Location: [facility];  Service: Endoscopy;  Laterality: N/A;   KNEE ARTHROSCOPY     L KNEE   LUMBAR LAMINECTOMY     LUMBAR LAMINECTOMY/DECOMPRESSION MICRODISCECTOMY  02/25/2011   Procedure: LUMBAR LAMINECTOMY/DECOMPRESSION MICRODISCECTOMY;  Surgeon: Catalina CHRISTELLA Stains;  Location: [facility];  Service: Neurosurgery;  Laterality: N/A;  Lumbar Four-Five Microdiscectomy   LUMBAR WOUND DEBRIDEMENT  03/18/2011   Procedure: LUMBAR WOUND DEBRIDEMENT;  Surgeon: Catalina CHRISTELLA Stains;  Location: [facility];  Service: Neurosurgery;  Laterality: N/A;  Exploration of Lumbar wound   POLYPECTOMY  10/06/2017   Procedure: POLYPECTOMY;  Surgeon: Rollin Dover, MD;  Location: [facility];  Service: Endoscopy;;   POLYPECTOMY  12/18/2020   Procedure: POLYPECTOMY;  Surgeon: Rollin Dover, MD;  Location: [facility];  Service: Endoscopy;;   SAVORY DILATION N/A 06/25/2021   Procedure: HARLEY DILATION;  Surgeon: Rollin Dover, MD;  Location: [facility];  Service: Endoscopy;  Laterality: N/A;   SEPTOPLASTY  10+yrs ago   SPINAL CORD STIMULATOR INSERTION N/A 12/11/2015   Procedure: LUMBAR SPINAL CORD STIMULATOR INSERTION;  Surgeon: Deward Fabian, MD;  Location: [facility];  Service: Neurosurgery;  Laterality: N/A;    Social History:  reports that she has never smoked. She has never been exposed to tobacco smoke. She has never used smokeless tobacco. She reports that she does not drink alcohol and does not use drugs. BMI: Estimated body mass index is 39.94 kg/m as calculated from the following:   Height as of 07/05/23: 5' 5.5 (1.664 m).   Weight as of 04/24/24: 110.5 kg.  Lab Results  Component Value Date   ALBUMIN 4.3 04/24/2024   Diabetes:   Patient has a diagnosis of diabetes,  Lab Results  Component Value Date   HGBA1C 5.5 04/24/2024   Smoking Status:   reports that she has never smoked. She has never been exposed to tobacco smoke. She has never used smokeless tobacco.    Allergies:  Allergies[1]  Medications: No current facility-administered medications for this encounter.   Current Outpatient Medications  Medication Sig Dispense Refill   ALPRAZolam  (XANAX ) 1 MG tablet Take one tablet by mouth daily as needed for severe anxiety. 30 tablet 0   BIOTIN PO Take 1 tablet by mouth daily.     buPROPion  (WELLBUTRIN  XL) 150 MG 24 hr tablet TAKE 3 TABLETS BY MOUTH DAILY. PLEASE SCHEDULE A PHYSICAL FOR MORE REFILLS. THANKS 270 tablet 1   Calcium-Magnesium -Vitamin D  (CALCIUM MAGNESIUM  PO) Take 1 tablet by mouth daily.     cetirizine (ZYRTEC) 10 MG tablet Take 10 mg by mouth daily.     CINNAMON PO Take 1 tablet by mouth daily.     cyanocobalamin  (VITAMIN B12) 1000 MCG/ML injection Inject 1 mL (1,000 mcg total) into the muscle every 30 (thirty) days. (Patient taking differently: Inject 1,000  mcg into the muscle every 14 (fourteen) days.) 3 mL 3   cyclobenzaprine  (FLEXERIL ) 5 MG tablet Take 5 mg by mouth 2 (two) times daily as needed for muscle spasms.     esomeprazole  (NEXIUM ) 40 MG capsule Take 40 mg by mouth daily at 12 noon.     Estradiol  10 MCG TABS vaginal tablet Place 1 tablet (10 mcg total) vaginally 2 (two) times a week. (Patient taking differently: Place 1 tablet vaginally once a week.) 24 tablet 3   furosemide  (LASIX ) 20 MG tablet TAKE ONE TABLET BY MOUTH DAILY AS NEEDED FOR  EDEMA (Patient taking differently: Take 20 mg by mouth every other day.) 90 tablet 1   gabapentin  (NEURONTIN ) 300 MG capsule Take 600 mg by mouth at bedtime.     hydrochlorothiazide  (HYDRODIURIL ) 25 MG tablet TAKE 1 TABLET (25 MG TOTAL) BY MOUTH DAILY. 90 tablet 1   ipratropium (ATROVENT ) 0.03 % nasal spray Place 2 sprays into both nostrils every 12 (twelve) hours. (Patient taking differently: Place 2 sprays into both nostrils daily as needed for rhinitis.) 30 mL 0   levETIRAcetam  (KEPPRA ) 500 MG tablet Take 2 tablets (1,000 mg total) by mouth at bedtime. 60 tablet 1   linaclotide  (LINZESS ) 290 MCG CAPS capsule Take 290 mcg by mouth in the morning.     MAGNESIUM  PO Take 1 tablet by mouth daily.     montelukast  (SINGULAIR ) 10 MG tablet TAKE 1 TABLET BY MOUTH EVERYDAY AT BEDTIME 90 tablet 1   Multiple Vitamin (MULTIVITAMIN WITH MINERALS) TABS tablet Take 1 tablet by mouth daily. One A Day for Women     Multiple Vitamins-Minerals (HAIR SKIN NAILS PO) Take 1 tablet by mouth daily.     nystatin  (MYCOSTATIN /NYSTOP ) powder Apply topically 4 (four) times daily. 60 g 2   nystatin  ointment (MYCOSTATIN ) APPLY 1 APPLICATION TOPICALLY 2 (TWO) TIMES DAILY AS NEEDED 30 g 0   pantoprazole  (PROTONIX ) 40 MG tablet Take 40 mg by mouth daily.     pilocarpine  (SALAGEN ) 5 MG tablet Take 5 mg by mouth 2 (two) times daily as needed (dryness).     potassium chloride  (KLOR-CON ) 10 MEQ tablet Take 2 tablets (20 mEq total) by mouth  daily. (Patient taking differently: Take 10 mEq by mouth 2 (two) times daily.) 180 tablet 1   Semaglutide , 2 MG/DOSE, 8 MG/3ML SOPN Inject 2 mg into the skin once a week. 9 mL 0   simethicone  (MYLICON) 125 MG chewable tablet Chew 125 mg by mouth every 6 (six) hours as needed for flatulence.     tiZANidine  (ZANAFLEX ) 4 MG tablet Take 4 mg by mouth 2 (two) times daily as needed for muscle spasms.     topiramate  (TOPAMAX ) 100 MG tablet Take 2 tablets (200 mg total) by mouth at bedtime. 60 tablet 1   valACYclovir  (VALTREX ) 500 MG tablet TAKE 1 TAB DAILY, MAY TAKE ADDITIONAL 1 TAB IF NEEDED FOR OUTBREAK. NEED EXAM WITH LABS FOR REFILLS 150 tablet 1   Vilazodone  HCl (VIIBRYD ) 40 MG TABS TAKE 1 TABLET BY MOUTH EVERY DAY 90 tablet 3   vitamin B-12 (CYANOCOBALAMIN ) 1000 MCG tablet Take 1,000 mcg by mouth in the morning.     VITAMIN D  PO Take 1 tablet by mouth daily.     albuterol  (VENTOLIN  HFA) 108 (90 Base) MCG/ACT inhaler Inhale 1-2 puffs into the lungs every 6 (six) hours as needed for wheezing or shortness of breath. 6.7 each 4   fluticasone  (FLONASE ) 50 MCG/ACT nasal spray PLACE 1 SPRAY INTO BOTH NOSTRILS DAILY AS NEEDED FOR ALLERGIES. 48 mL 2    Results for orders placed or performed in visit on 04/24/24 (from the past 48 hours)  POC HgB A1c     Status: None   Collection Time: 04/24/24  1:37 PM  Result Value Ref Range   Hemoglobin A1C 5.5 4.0 - 5.6 %   HbA1c POC (<> result, manual entry)     HbA1c, POC (prediabetic range)     HbA1c, POC (controlled diabetic range)    CMP     Status: Abnormal   Collection Time: 04/24/24  2:34 PM  Result Value Ref Range   Sodium 139 135 - 145 mEq/L   Potassium 3.9 3.5 - 5.1 mEq/L   Chloride 102 96 - 112 mEq/L   CO2 27 19 - 32 mEq/L    Comment: Elevated LDH levels may cause falsely increased CO2 results. If LDH is >2000 U/L, a positive bias of 12% is possible.   Glucose, Bld 93 70 - 99 mg/dL   BUN 14 6 - 23 mg/dL   Creatinine, Ser 8.90 0.40 - 1.20 mg/dL    Total Bilirubin 0.4 0.2 - 1.2 mg/dL   Alkaline Phosphatase 85 39 - 117 U/L   AST 21 5 - 37 U/L   ALT 16 3 - 35 U/L   Total Protein 7.3 6.0 - 8.3 g/dL   Albumin 4.3 3.5 - 5.2 g/dL   GFR 46.31 (L) >39.99 mL/min    Comment: Calculated using the CKD-EPI Creatinine Equation (2021)   Calcium 9.2 8.4 - 10.5 mg/dL  CBC with Differential/Platelet     Status: None   Collection Time: 04/24/24  2:34 PM  Result Value Ref Range   WBC 6.0 4.0 - 10.5 K/uL   RBC 4.21 3.87 - 5.11 Mil/uL   Hemoglobin 12.8 12.0 - 15.0 g/dL   HCT 61.7 63.9 - 53.9 %   MCV 90.7 78.0 - 100.0 fl   MCHC 33.6 30.0 - 36.0 g/dL   RDW 86.3 88.4 - 84.4 %   Platelets 211.0 150.0 - 400.0 K/uL   Neutrophils Relative % 57.7 43.0 - 77.0 %   Lymphocytes Relative 30.2 12.0 - 46.0 %   Monocytes Relative 6.9 3.0 - 12.0 %   Eosinophils Relative 4.4 0.0 - 5.0 %   Basophils Relative 0.8 0.0 - 3.0 %   Neutro Abs 3.5 1.4 - 7.7 K/uL   Lymphs Abs 1.8 0.7 - 4.0 K/uL   Monocytes Absolute 0.4 0.1 - 1.0 K/uL   Eosinophils Absolute 0.3 0.0 - 0.7 K/uL   Basophils Absolute 0.0 0.0 - 0.1 K/uL   No results found.  ROS: Pain with rom of the left upper extremity  Physical Exam: Alert and oriented 64 y.o. female in no acute distress Cranial nerves 2-12 intact Cervical spine: full rom with no tenderness, nv intact distally Chest: active breath sounds bilaterally, no wheeze rhonchi or rales Heart: regular rate and rhythm, no murmur Abd: non tender non distended with active bowel sounds Hip is stable with rom  Left shoulder with edema and ecchymosis Limited rom due to fracture Nv intact distally No signs of open injury  Assessment/Plan Assessment: left proximal humerus fracture  Plan:  Patient will undergo a left proximal humerus ORIF vs reverse by Dr. Kay at [facility] Risks benefits and expectations were discussed with the patient. Patient understand risks, benefits and expectations and wishes to proceed. Preoperative templating of the  joint replacement has been completed, documented, and submitted to the Operating Room personnel in order to optimize intra-operative equipment management.   Arvella Fireman PA-C, MPAS [facility] is now [employer] [address], [place], [place] [address] Phone: [phone] [email or web address]  [employer], [employer]/film/video        [1]  Allergies Allergen Reactions   Other Other (See Comments)    Nylon sutures had to remove to allow healing  Vicryl sutures - do not use   Percocet [Oxycodone -Acetaminophen ] Other (See Comments)    makes her crazy   Augmentin  [Amoxicillin -Pot Clavulanate] Other (See Comments)  Unknown reaction   Codeine Hives   Dye Fdc Red [Red Dye #40 (Allura Red)] Hives and Other (See Comments)    Scan dye, broke out in hives all over once with a neck scan   Morphine  And Codeine Hives and Itching   Vesicare [Solifenacin Succinate] Itching   "

## 2024-04-25 NOTE — Anesthesia Preprocedure Evaluation (Signed)
 "                                  Anesthesia Evaluation  Patient identified by MRN, date of birth, ID band Patient awake    Reviewed: Allergy & Precautions, NPO status , Patient's Chart, lab work & pertinent test results  History of Anesthesia Complications Negative for: history of anesthetic complications  Airway Mallampati: II  TM Distance: >3 FB Neck ROM: Limited    Dental no notable dental hx. (+) Teeth Intact, Dental Advisory Given   Pulmonary asthma , sleep apnea    Pulmonary exam normal breath sounds clear to auscultation       Cardiovascular hypertension, (-) Past MI and (-) CABG Normal cardiovascular exam Rhythm:Regular Rate:Normal     Neuro/Psych  Headaches PSYCHIATRIC DISORDERS Anxiety Depression     Neuromuscular disease    GI/Hepatic ,GERD  Medicated and Controlled,,  Endo/Other  diabetes, Type 2    Renal/GU Lab Results      Component                Value               Date                          K                        3.9                 04/24/2024                CREATININE               1.09                04/24/2024                    Musculoskeletal  (+) Arthritis ,  Fibromyalgia -  Abdominal   Peds  Hematology Lab Results      Component                Value               Date                      WBC                      6.0                 04/24/2024                HGB                      12.8                04/24/2024                HCT                      38.2                04/24/2024                MCV  90.7                04/24/2024                PLT                      211.0               04/24/2024              Anesthesia Other Findings All: see list  Reproductive/Obstetrics                              Anesthesia Physical Anesthesia Plan  ASA: 3  Anesthesia Plan: General and Regional   Post-op Pain Management: Regional block*, Minimal or no pain anticipated,  Precedex  and Ofirmev  IV (intra-op)*   Induction: Intravenous  PONV Risk Score and Plan: 4 or greater and Treatment may vary due to age or medical condition, Midazolam , Dexamethasone  and Ondansetron   Airway Management Planned: Oral ETT and Video Laryngoscope Planned  Additional Equipment: None  Intra-op Plan:   Post-operative Plan: Extubation in OR  Informed Consent: I have reviewed the patients History and Physical, chart, labs and discussed the procedure including the risks, benefits and alternatives for the proposed anesthesia with the patient or authorized representative who has indicated his/her understanding and acceptance.     Dental advisory given  Plan Discussed with: CRNA and Surgeon  Anesthesia Plan Comments: (GA w L ISB Pt with hx of Neck fusion c4-c7  will use glide scope for intubation )         Anesthesia Quick Evaluation  "

## 2024-04-26 ENCOUNTER — Encounter (HOSPITAL_COMMUNITY): Admission: RE | Payer: Self-pay | Source: Home / Self Care

## 2024-04-26 ENCOUNTER — Other Ambulatory Visit: Payer: Self-pay

## 2024-04-26 ENCOUNTER — Ambulatory Visit (HOSPITAL_COMMUNITY)

## 2024-04-26 ENCOUNTER — Observation Stay (HOSPITAL_COMMUNITY): Admission: RE | Admit: 2024-04-26 | Source: Home / Self Care | Admitting: Orthopedic Surgery

## 2024-04-26 ENCOUNTER — Encounter (HOSPITAL_COMMUNITY): Payer: Self-pay | Admitting: Orthopedic Surgery

## 2024-04-26 ENCOUNTER — Encounter (HOSPITAL_COMMUNITY): Payer: Self-pay | Admitting: Medical

## 2024-04-26 DIAGNOSIS — E1165 Type 2 diabetes mellitus with hyperglycemia: Secondary | ICD-10-CM

## 2024-04-26 DIAGNOSIS — S42292D Other displaced fracture of upper end of left humerus, subsequent encounter for fracture with routine healing: Secondary | ICD-10-CM

## 2024-04-26 DIAGNOSIS — Z01818 Encounter for other preprocedural examination: Principal | ICD-10-CM

## 2024-04-26 HISTORY — DX: Polyneuropathy, unspecified: G62.9

## 2024-04-26 HISTORY — DX: Cardiac arrhythmia, unspecified: I49.9

## 2024-04-26 LAB — SURGICAL PCR SCREEN
MRSA, PCR: NEGATIVE
Staphylococcus aureus: POSITIVE — AB

## 2024-04-26 LAB — GLUCOSE, CAPILLARY
Glucose-Capillary: 87 mg/dL (ref 70–99)
Glucose-Capillary: 83 mg/dL (ref 70–99)
Glucose-Capillary: 92 mg/dL (ref 70–99)

## 2024-04-26 MED ORDER — MUPIROCIN 2 % EX OINT: 1.0000 | TOPICAL_OINTMENT | Freq: Two times a day (BID) | CUTANEOUS | 0 refills | Status: AC

## 2024-04-26 MED ORDER — CEFAZOLIN SODIUM-DEXTROSE 2-4 GM/100ML-% IV SOLN
2.0000 g | Freq: Four times a day (QID) | INTRAVENOUS | Status: AC
  Administered 2024-04-26: 2 g via INTRAVENOUS
  Filled 2024-04-26: qty 100

## 2024-04-26 MED ORDER — DEXAMETHASONE SODIUM PHOSPHATE 4 MG/ML IJ SOLN
INTRAMUSCULAR | Status: DC | PRN
  Administered 2024-04-26: 8 mg via INTRAVENOUS

## 2024-04-26 MED ORDER — 0.9 % SODIUM CHLORIDE (POUR BTL) OPTIME
TOPICAL | Status: DC | PRN
  Administered 2024-04-26: 1000 mL

## 2024-04-26 MED ORDER — SODIUM CHLORIDE 0.9 % IV SOLN: INTRAVENOUS | Status: AC

## 2024-04-26 MED ORDER — ALBUTEROL SULFATE HFA 108 (90 BASE) MCG/ACT IN AERS: 1.0000 | INHALATION_SPRAY | Freq: Four times a day (QID) | RESPIRATORY_TRACT | Status: DC | PRN

## 2024-04-26 MED ORDER — MIDAZOLAM HCL (PF) 2 MG/2ML IJ SOLN
1.0000 mg | INTRAMUSCULAR | Status: AC
  Administered 2024-04-26: 2 mg via INTRAVENOUS
  Filled 2024-04-26: qty 2

## 2024-04-26 MED ORDER — METOCLOPRAMIDE HCL 5 MG PO TABS: 5.0000 mg | ORAL_TABLET | Freq: Three times a day (TID) | ORAL | Status: AC | PRN

## 2024-04-26 MED ORDER — ROCURONIUM BROMIDE 100 MG/10ML IV SOLN
INTRAVENOUS | Status: DC | PRN
  Administered 2024-04-26: 60 mg via INTRAVENOUS

## 2024-04-26 MED ORDER — PILOCARPINE HCL 5 MG PO TABS: 5.0000 mg | ORAL_TABLET | Freq: Two times a day (BID) | ORAL | Status: AC | PRN

## 2024-04-26 MED ORDER — CEFAZOLIN SODIUM-DEXTROSE 2-4 GM/100ML-% IV SOLN
2.0000 g | INTRAVENOUS | Status: AC
  Administered 2024-04-26: 2 g via INTRAVENOUS
  Filled 2024-04-26: qty 100

## 2024-04-26 MED ORDER — HYDROMORPHONE HCL 1 MG/ML IJ SOLN: 0.2500 mg | INTRAMUSCULAR | Status: DC | PRN

## 2024-04-26 MED ORDER — ONDANSETRON HCL 4 MG/2ML IJ SOLN: 4.0000 mg | Freq: Once | INTRAMUSCULAR | Status: DC | PRN

## 2024-04-26 MED ORDER — FUROSEMIDE 20 MG PO TABS: 20.0000 mg | ORAL_TABLET | ORAL | Status: AC

## 2024-04-26 MED ORDER — METOCLOPRAMIDE HCL 5 MG/ML IJ SOLN: 5.0000 mg | Freq: Three times a day (TID) | INTRAMUSCULAR | Status: AC | PRN

## 2024-04-26 MED ORDER — HYDROCODONE-ACETAMINOPHEN 5-325 MG PO TABS: 1.0000 | ORAL_TABLET | ORAL | Status: AC | PRN

## 2024-04-26 MED ORDER — PHENYLEPHRINE 80 MCG/ML (10ML) SYRINGE FOR IV PUSH (FOR BLOOD PRESSURE SUPPORT)
PREFILLED_SYRINGE | INTRAVENOUS | Status: AC
  Filled 2024-04-26: qty 10

## 2024-04-26 MED ORDER — LEVETIRACETAM 500 MG PO TABS
1000.0000 mg | ORAL_TABLET | Freq: Every day | ORAL | Status: AC
  Administered 2024-04-26: 1000 mg via ORAL
  Filled 2024-04-26: qty 2

## 2024-04-26 MED ORDER — STERILE WATER FOR IRRIGATION IR SOLN
Status: DC | PRN
  Administered 2024-04-26: 1000 mL

## 2024-04-26 MED ORDER — ONDANSETRON HCL 4 MG/2ML IJ SOLN: 4.0000 mg | Freq: Four times a day (QID) | INTRAMUSCULAR | Status: AC | PRN

## 2024-04-26 MED ORDER — SIMETHICONE 80 MG PO CHEW: 80.0000 mg | CHEWABLE_TABLET | Freq: Four times a day (QID) | ORAL | Status: AC | PRN

## 2024-04-26 MED ORDER — NYSTATIN 100000 UNIT/GM EX OINT: 1.0000 | TOPICAL_OINTMENT | Freq: Two times a day (BID) | CUTANEOUS | Status: AC | PRN

## 2024-04-26 MED ORDER — BUPIVACAINE-EPINEPHRINE (PF) 0.25% -1:200000 IJ SOLN
INTRAMUSCULAR | Status: AC
  Filled 2024-04-26: qty 30

## 2024-04-26 MED ORDER — BUPROPION HCL ER (XL) 300 MG PO TB24: 300.0000 mg | ORAL_TABLET | Freq: Every day | ORAL | Status: AC

## 2024-04-26 MED ORDER — VITAMIN B-12 1000 MCG PO TABS: 1000.0000 ug | ORAL_TABLET | Freq: Every day | ORAL | Status: AC

## 2024-04-26 MED ORDER — NYSTATIN 100000 UNIT/GM EX POWD
Freq: Four times a day (QID) | CUTANEOUS | Status: AC
  Filled 2024-04-26: qty 15

## 2024-04-26 MED ORDER — LACTATED RINGERS IV SOLN: INTRAVENOUS | Status: DC

## 2024-04-26 MED ORDER — GABAPENTIN 300 MG PO CAPS
600.0000 mg | ORAL_CAPSULE | Freq: Every day | ORAL | Status: AC
  Administered 2024-04-26: 600 mg via ORAL
  Filled 2024-04-26: qty 2

## 2024-04-26 MED ORDER — MORPHINE SULFATE (PF) 2 MG/ML IV SOLN: 0.5000 mg | INTRAVENOUS | Status: AC | PRN

## 2024-04-26 MED ORDER — PANTOPRAZOLE SODIUM 40 MG PO TBEC: 40.0000 mg | DELAYED_RELEASE_TABLET | Freq: Every day | ORAL | Status: DC

## 2024-04-26 MED ORDER — FENTANYL CITRATE (PF) 50 MCG/ML IJ SOSY
50.0000 ug | PREFILLED_SYRINGE | INTRAMUSCULAR | Status: AC
  Administered 2024-04-26: 50 ug via INTRAVENOUS
  Filled 2024-04-26: qty 2

## 2024-04-26 MED ORDER — LIDOCAINE HCL (CARDIAC) PF 100 MG/5ML IV SOSY
PREFILLED_SYRINGE | INTRAVENOUS | Status: DC | PRN
  Administered 2024-04-26: 60 mg via INTRAVENOUS

## 2024-04-26 MED ORDER — BUPIVACAINE LIPOSOME 1.3 % IJ SUSP
INTRAMUSCULAR | Status: DC | PRN
  Administered 2024-04-26: 10 mL via PERINEURAL

## 2024-04-26 MED ORDER — HYDROCODONE-ACETAMINOPHEN 7.5-325 MG PO TABS: 1.0000 | ORAL_TABLET | Freq: Once | ORAL | Status: DC | PRN

## 2024-04-26 MED ORDER — MONTELUKAST SODIUM 10 MG PO TABS
10.0000 mg | ORAL_TABLET | Freq: Every day | ORAL | Status: AC
  Administered 2024-04-26: 10 mg via ORAL
  Filled 2024-04-26: qty 1

## 2024-04-26 MED ORDER — CYANOCOBALAMIN 1000 MCG/ML IJ SOLN: 1000.0000 ug | INTRAMUSCULAR | Status: DC

## 2024-04-26 MED ORDER — IPRATROPIUM BROMIDE 0.03 % NA SOLN: 2.0000 | Freq: Every day | NASAL | Status: AC | PRN

## 2024-04-26 MED ORDER — CHLORHEXIDINE GLUCONATE 4 % EX SOLN: 1.0000 | CUTANEOUS | 1 refills | Status: AC

## 2024-04-26 MED ORDER — VITAMIN D 25 MCG (1000 UNIT) PO TABS: 1000.0000 [IU] | ORAL_TABLET | Freq: Every day | ORAL | Status: AC

## 2024-04-26 MED ORDER — INSULIN ASPART 100 UNIT/ML IJ SOLN: 0.0000 [IU] | Freq: Three times a day (TID) | INTRAMUSCULAR | Status: AC

## 2024-04-26 MED ORDER — ONDANSETRON HCL 4 MG PO TABS: 4.0000 mg | ORAL_TABLET | Freq: Four times a day (QID) | ORAL | Status: AC | PRN

## 2024-04-26 MED ORDER — ONDANSETRON HCL 4 MG/2ML IJ SOLN
INTRAMUSCULAR | Status: AC
  Filled 2024-04-26: qty 2

## 2024-04-26 MED ORDER — PHENYLEPHRINE HCL (PRESSORS) 10 MG/ML IV SOLN
INTRAVENOUS | Status: AC
  Filled 2024-04-26: qty 1

## 2024-04-26 MED ORDER — TIZANIDINE HCL 4 MG PO TABS
4.0000 mg | ORAL_TABLET | Freq: Two times a day (BID) | ORAL | Status: AC | PRN
  Administered 2024-04-26: 4 mg via ORAL
  Filled 2024-04-26: qty 1

## 2024-04-26 MED ORDER — ONDANSETRON HCL 4 MG/2ML IJ SOLN
INTRAMUSCULAR | Status: DC | PRN
  Administered 2024-04-26: 4 mg via INTRAVENOUS

## 2024-04-26 MED ORDER — FLUTICASONE PROPIONATE 50 MCG/ACT NA SUSP: 1.0000 | Freq: Every day | NASAL | Status: AC | PRN

## 2024-04-26 MED ORDER — HYDROCODONE-ACETAMINOPHEN 5-325 MG PO TABS: 1.0000 | ORAL_TABLET | Freq: Four times a day (QID) | ORAL | 0 refills | Status: AC | PRN

## 2024-04-26 MED ORDER — DEXMEDETOMIDINE HCL IN NACL 80 MCG/20ML IV SOLN
INTRAVENOUS | Status: AC
  Filled 2024-04-26: qty 20

## 2024-04-26 MED ORDER — LINACLOTIDE 145 MCG PO CAPS: 290.0000 ug | ORAL_CAPSULE | Freq: Every day | ORAL | Status: AC

## 2024-04-26 MED ORDER — INSULIN ASPART 100 UNIT/ML IJ SOLN: 0.0000 [IU] | Freq: Every day | INTRAMUSCULAR | Status: AC

## 2024-04-26 MED ORDER — BUPIVACAINE-EPINEPHRINE (PF) 0.25% -1:200000 IJ SOLN
INTRAMUSCULAR | Status: DC | PRN
  Administered 2024-04-26: 15 mL

## 2024-04-26 MED ORDER — ACETAMINOPHEN 325 MG PO TABS
325.0000 mg | ORAL_TABLET | Freq: Four times a day (QID) | ORAL | Status: AC | PRN
  Administered 2024-04-26: 650 mg via ORAL
  Filled 2024-04-26: qty 2

## 2024-04-26 MED ORDER — PROPOFOL 10 MG/ML IV BOLUS
INTRAVENOUS | Status: DC | PRN
  Administered 2024-04-26: 160 mg via INTRAVENOUS

## 2024-04-26 MED ORDER — LORATADINE 10 MG PO TABS: 10.0000 mg | ORAL_TABLET | Freq: Every day | ORAL | Status: AC

## 2024-04-26 MED ORDER — ADULT MULTIVITAMIN W/MINERALS CH: 1.0000 | ORAL_TABLET | Freq: Every day | ORAL | Status: AC

## 2024-04-26 MED ORDER — VANCOMYCIN HCL 1000 MG IV SOLR
INTRAVENOUS | Status: AC
  Filled 2024-04-26: qty 20

## 2024-04-26 MED ORDER — FENTANYL CITRATE (PF) 100 MCG/2ML IJ SOLN
INTRAMUSCULAR | Status: AC
  Filled 2024-04-26: qty 2

## 2024-04-26 MED ORDER — DEXAMETHASONE SOD PHOSPHATE PF 10 MG/ML IJ SOLN
INTRAMUSCULAR | Status: AC
  Filled 2024-04-26: qty 1

## 2024-04-26 MED ORDER — ACETAMINOPHEN 10 MG/ML IV SOLN: 1000.0000 mg | Freq: Once | INTRAVENOUS | Status: DC | PRN

## 2024-04-26 MED ORDER — INSULIN ASPART 100 UNIT/ML IJ SOLN: 6.0000 [IU] | Freq: Three times a day (TID) | INTRAMUSCULAR | Status: AC

## 2024-04-26 MED ORDER — SENNA 8.6 MG PO TABS: 1.0000 | ORAL_TABLET | Freq: Every day | ORAL | Status: AC

## 2024-04-26 MED ORDER — ESTRADIOL 10 MCG VA TABS: 1.0000 | ORAL_TABLET | VAGINAL | Status: AC

## 2024-04-26 MED ORDER — PANTOPRAZOLE SODIUM 40 MG PO TBEC: 40.0000 mg | DELAYED_RELEASE_TABLET | Freq: Every day | ORAL | Status: AC

## 2024-04-26 MED ORDER — PHENOL 1.4 % MT LIQD: 1.0000 | OROMUCOSAL | Status: AC | PRN

## 2024-04-26 MED ORDER — SUGAMMADEX SODIUM 200 MG/2ML IV SOLN
INTRAVENOUS | Status: DC | PRN
  Administered 2024-04-26: 200 mg via INTRAVENOUS

## 2024-04-26 MED ORDER — SEMAGLUTIDE (2 MG/DOSE) 8 MG/3ML ~~LOC~~ SOPN: 2.0000 mg | PEN_INJECTOR | SUBCUTANEOUS | Status: DC

## 2024-04-26 MED ORDER — TRANEXAMIC ACID-NACL 1000-0.7 MG/100ML-% IV SOLN
1000.0000 mg | INTRAVENOUS | Status: AC
  Administered 2024-04-26: 1000 mg via INTRAVENOUS
  Filled 2024-04-26: qty 100

## 2024-04-26 MED ORDER — VILAZODONE HCL 20 MG PO TABS: 40.0000 mg | ORAL_TABLET | Freq: Every day | ORAL | Status: AC

## 2024-04-26 MED ORDER — PROPOFOL 10 MG/ML IV BOLUS
INTRAVENOUS | Status: AC
  Filled 2024-04-26: qty 20

## 2024-04-26 MED ORDER — MENTHOL 3 MG MT LOZG: 1.0000 | LOZENGE | OROMUCOSAL | Status: AC | PRN

## 2024-04-26 MED ORDER — BUPIVACAINE HCL (PF) 0.5 % IJ SOLN
INTRAMUSCULAR | Status: DC | PRN
  Administered 2024-04-26: 15 mL via PERINEURAL

## 2024-04-26 MED ORDER — TOPIRAMATE 100 MG PO TABS
200.0000 mg | ORAL_TABLET | Freq: Every day | ORAL | Status: AC
  Administered 2024-04-26: 200 mg via ORAL
  Filled 2024-04-26: qty 2

## 2024-04-26 MED ORDER — VALACYCLOVIR HCL 500 MG PO TABS: 500.0000 mg | ORAL_TABLET | Freq: Every day | ORAL | Status: AC

## 2024-04-26 MED ORDER — DEXMEDETOMIDINE HCL IN NACL 80 MCG/20ML IV SOLN
INTRAVENOUS | Status: DC | PRN
  Administered 2024-04-26: 8 ug via INTRAVENOUS
  Administered 2024-04-26: 4 ug via INTRAVENOUS

## 2024-04-26 MED ORDER — HYDROCHLOROTHIAZIDE 25 MG PO TABS: 25.0000 mg | ORAL_TABLET | Freq: Every day | ORAL | Status: AC

## 2024-04-26 MED ORDER — ROCURONIUM BROMIDE 10 MG/ML (PF) SYRINGE
PREFILLED_SYRINGE | INTRAVENOUS | Status: AC
  Filled 2024-04-26: qty 10

## 2024-04-26 MED ORDER — POTASSIUM CHLORIDE ER 10 MEQ PO TBCR
10.0000 meq | EXTENDED_RELEASE_TABLET | Freq: Two times a day (BID) | ORAL | Status: AC
  Administered 2024-04-26: 10 meq via ORAL
  Filled 2024-04-26 (×2): qty 1

## 2024-04-26 MED ORDER — PHENYLEPHRINE 80 MCG/ML (10ML) SYRINGE FOR IV PUSH (FOR BLOOD PRESSURE SUPPORT)
PREFILLED_SYRINGE | INTRAVENOUS | Status: DC | PRN
  Administered 2024-04-26 (×2): 80 ug via INTRAVENOUS
  Administered 2024-04-26: 160 ug via INTRAVENOUS
  Administered 2024-04-26: 80 ug via INTRAVENOUS

## 2024-04-26 MED ORDER — FENTANYL CITRATE (PF) 100 MCG/2ML IJ SOLN
INTRAMUSCULAR | Status: DC | PRN
  Administered 2024-04-26: 100 ug via INTRAVENOUS

## 2024-04-26 MED ORDER — VILAZODONE HCL 40 MG PO TABS: 40.0000 mg | ORAL_TABLET | Freq: Every day | ORAL | Status: DC

## 2024-04-26 NOTE — Transfer of Care (Signed)
 Immediate Anesthesia Transfer of Care Note  Patient: Taylor Ritter  Procedure(s) Performed: OPEN REDUCTION INTERNAL FIXATION (ORIF) PROXIMAL HUMERUS FRACTURE (Left)  Patient Location: PACU  Anesthesia Type:General  Level of Consciousness: alert , oriented, and drowsy  Airway & Oxygen Therapy: Patient Spontanous Breathing and Patient connected to face mask oxygen  Post-op Assessment: Report given to RN and Post -op Vital signs reviewed and stable  Post vital signs: Reviewed and stable  Last Vitals:  Vitals Value Taken Time  BP 176/89 04/26/24 19:16  Temp 97.4   Pulse 72 04/26/24 19:20  Resp 13 04/26/24 19:20  SpO2 98 % 04/26/24 19:20  Vitals shown include unfiled device data.  Last Pain:  Vitals:   04/26/24 1619  TempSrc:   PainSc: 0-No pain      Patients Stated Pain Goal: 3 (04/25/24 1050)  Complications: No notable events documented.

## 2024-04-26 NOTE — Brief Op Note (Signed)
 OR Date: 04/26/2024 OR Patient Start Time:  4:32 PM  7:11 PM  PATIENT:  Verneita DELENA Fire  64 y.o. female  PRE-OPERATIVE DIAGNOSIS:  Other closed displaced fracture of proximal end of left humerus, initial encounter  POST-OPERATIVE DIAGNOSIS:  Other closed displaced fracture of proximal end of left humerus, initial encounter  PROCEDURE:  Procedures: OPEN REDUCTION INTERNAL FIXATION (ORIF) PROXIMAL HUMERUS FRACTURE (Left) Biomet ALPS proximal humerus plate  SURGEON:  Surgeons and Role:    DEWAINE Kay Kemps, MD - Primary  PHYSICIAN ASSISTANT:   ASSISTANTS: Debby KATHEE Fireman, PA-C   ANESTHESIA:   regional and general  EBL:  minimal  BLOOD ADMINISTERED:none  DRAINS: none   LOCAL MEDICATIONS USED:  MARCAINE      SPECIMEN:  No Specimen  DISPOSITION OF SPECIMEN:  N/A  COUNTS:  YES  TOURNIQUET:  * No tourniquets in log *  DICTATION: .Other Dictation: Dictation Number [id number]  PLAN OF CARE: Admit for overnight observation  PATIENT DISPOSITION:  PACU - hemodynamically stable.   Delay start of Pharmacological VTE agent (>24hrs) due to surgical blood loss or risk of bleeding: not applicable

## 2024-04-26 NOTE — Interval H&P Note (Signed)
 History and Physical Interval Note:  04/26/2024 3:18 PM  Taylor Ritter  has presented today for surgery, with the diagnosis of Other closed displaced fracture of proximal end of left humerus, initial encounter.  The various methods of treatment have been discussed with the patient and family. After consideration of risks, benefits and other options for treatment, the patient has consented to  Procedures: OPEN REDUCTION INTERNAL FIXATION (ORIF) PROXIMAL HUMERUS FRACTURE (Left) ARTHROPLASTY, SHOULDER, TOTAL, REVERSE (Left) as a surgical intervention.  The patient's history has been reviewed, patient examined, no change in status, stable for surgery.  I have reviewed the patient's chart and labs.  Questions were answered to the patient's satisfaction.     [name]

## 2024-04-26 NOTE — Plan of Care (Signed)
" °  Problem: Respiratory: Goal: Will regain and/or maintain adequate ventilation Outcome: Progressing Goal: Respiratory status will improve Outcome: Progressing   Problem: Coping: Goal: Level of anxiety will decrease Outcome: Progressing   Problem: Elimination: Goal: Will not experience complications related to bowel motility Outcome: Progressing Goal: Will not experience complications related to urinary retention Outcome: Progressing   Problem: Pain Managment: Goal: General experience of comfort will improve and/or be controlled Outcome: Progressing   Problem: Safety: Goal: Ability to remain free from injury will improve Outcome: Progressing   "

## 2024-04-26 NOTE — Anesthesia Procedure Notes (Signed)
 Procedure Name: Intubation Date/Time: 04/26/2024 6:28 PM  Performed by: Gladis Honey, CRNAPre-anesthesia Checklist: Patient identified, Emergency Drugs available, Suction available and Patient being monitored Patient Re-evaluated:Patient Re-evaluated prior to induction Oxygen Delivery Method: Circle System Utilized Preoxygenation: Pre-oxygenation with 100% oxygen Induction Type: IV induction Ventilation: Mask ventilation without difficulty Laryngoscope Size: Glidescope and 3 Grade View: Grade I Tube type: Oral Tube size: 7.0 mm Number of attempts: 1 Airway Equipment and Method: Stylet and Oral airway Placement Confirmation: ETT inserted through vocal cords under direct vision, positive ETCO2 and breath sounds checked- equal and bilateral Secured at: 23 cm Tube secured with: Tape Dental Injury: Teeth and Oropharynx as per pre-operative assessment

## 2024-04-26 NOTE — Discharge Instructions (Signed)
 Ice to the shoulder constantly.  Keep the incision covered and clean and dry for one week, then ok to get it wet in the shower.  Do gentle exercise as instructed several times per day. Do not lift push or pull with the arm.  DO NOT reach behind your back or push up out of a chair with the operative arm.  Use a sling while you are up and around for comfort, may remove while seated.  Keep pillow propped behind the operative elbow.  Follow up with Dr Kay in two weeks in the office, call [phone] for appt  Please call Dr Kay (cell) [phone] with any questions or concerns

## 2024-04-26 NOTE — Anesthesia Procedure Notes (Signed)
 Anesthesia Regional Block: Interscalene brachial plexus block   Pre-Anesthetic Checklist: , timeout performed,  Correct Patient, Correct Site, Correct Laterality,  Correct Procedure, Correct Position, site marked,  Risks and benefits discussed,  Surgical consent,  Pre-op evaluation,  At surgeon's request and post-op pain management  Laterality: Upper and Left  Prep: Maximum Sterile Barrier Precautions used, chloraprep       Needles:  Injection technique: Single-shot  Needle Type: Echogenic Needle     Needle Length: 5cm  Needle Gauge: 21     Additional Needles:   Procedures:,,,, ultrasound used (permanent image in chart),,    Narrative:  Start time: 04/26/2024 4:07 PM End time: 04/26/2024 4:12 PM Injection made incrementally with aspirations every 5 mL.  Performed by: Personally  Anesthesiologist: Jefm Garnette LABOR, MD  Additional Notes: Block assessed prior to procedure. Patient tolerated procedure well.

## 2024-04-30 ENCOUNTER — Ambulatory Visit: Admitting: Family Medicine

## 2024-07-10 ENCOUNTER — Ambulatory Visit
